# Patient Record
Sex: Male | Born: 1950
Health system: Southern US, Community
[De-identification: ages and names within clinical notes are randomized; demographics above are authoritative.]

## PROBLEM LIST (undated history)

## (undated) DIAGNOSIS — I10 Essential (primary) hypertension: Secondary | ICD-10-CM

## (undated) DIAGNOSIS — E785 Hyperlipidemia, unspecified: Secondary | ICD-10-CM

## (undated) DIAGNOSIS — K8020 Calculus of gallbladder without cholecystitis without obstruction: Secondary | ICD-10-CM

## (undated) DIAGNOSIS — E119 Type 2 diabetes mellitus without complications: Secondary | ICD-10-CM

## (undated) HISTORY — PX: CIRCUMCISION: SUR203

## (undated) HISTORY — PX: TONSILLECTOMY: SUR1361

## (undated) HISTORY — PX: OTHER SURGICAL HISTORY: SHX169

---

## 2016-11-08 DIAGNOSIS — M545 Low back pain: Secondary | ICD-10-CM | POA: Diagnosis not present

## 2016-11-08 DIAGNOSIS — I10 Essential (primary) hypertension: Secondary | ICD-10-CM | POA: Diagnosis not present

## 2016-11-08 DIAGNOSIS — Z23 Encounter for immunization: Secondary | ICD-10-CM | POA: Diagnosis not present

## 2016-11-08 DIAGNOSIS — R972 Elevated prostate specific antigen [PSA]: Secondary | ICD-10-CM | POA: Diagnosis not present

## 2016-11-08 DIAGNOSIS — M159 Polyosteoarthritis, unspecified: Secondary | ICD-10-CM | POA: Diagnosis not present

## 2016-11-08 DIAGNOSIS — E7800 Pure hypercholesterolemia, unspecified: Secondary | ICD-10-CM | POA: Diagnosis not present

## 2016-11-08 DIAGNOSIS — Z6825 Body mass index (BMI) 25.0-25.9, adult: Secondary | ICD-10-CM | POA: Diagnosis not present

## 2016-11-08 DIAGNOSIS — E119 Type 2 diabetes mellitus without complications: Secondary | ICD-10-CM | POA: Diagnosis not present

## 2016-11-08 DIAGNOSIS — E663 Overweight: Secondary | ICD-10-CM | POA: Diagnosis not present

## 2017-01-15 DIAGNOSIS — N401 Enlarged prostate with lower urinary tract symptoms: Secondary | ICD-10-CM | POA: Diagnosis not present

## 2017-01-15 DIAGNOSIS — R972 Elevated prostate specific antigen [PSA]: Secondary | ICD-10-CM | POA: Diagnosis not present

## 2017-01-17 DIAGNOSIS — L821 Other seborrheic keratosis: Secondary | ICD-10-CM | POA: Diagnosis not present

## 2017-01-17 DIAGNOSIS — L578 Other skin changes due to chronic exposure to nonionizing radiation: Secondary | ICD-10-CM | POA: Diagnosis not present

## 2017-01-17 DIAGNOSIS — L853 Xerosis cutis: Secondary | ICD-10-CM | POA: Diagnosis not present

## 2017-01-17 DIAGNOSIS — D485 Neoplasm of uncertain behavior of skin: Secondary | ICD-10-CM | POA: Diagnosis not present

## 2017-02-06 DIAGNOSIS — E119 Type 2 diabetes mellitus without complications: Secondary | ICD-10-CM | POA: Diagnosis not present

## 2017-02-06 DIAGNOSIS — Z6824 Body mass index (BMI) 24.0-24.9, adult: Secondary | ICD-10-CM | POA: Diagnosis not present

## 2017-02-06 DIAGNOSIS — E7800 Pure hypercholesterolemia, unspecified: Secondary | ICD-10-CM | POA: Diagnosis not present

## 2017-02-06 DIAGNOSIS — I10 Essential (primary) hypertension: Secondary | ICD-10-CM | POA: Diagnosis not present

## 2017-02-06 DIAGNOSIS — M545 Low back pain: Secondary | ICD-10-CM | POA: Diagnosis not present

## 2017-02-06 DIAGNOSIS — R972 Elevated prostate specific antigen [PSA]: Secondary | ICD-10-CM | POA: Diagnosis not present

## 2017-02-06 DIAGNOSIS — M159 Polyosteoarthritis, unspecified: Secondary | ICD-10-CM | POA: Diagnosis not present

## 2017-02-06 DIAGNOSIS — E669 Obesity, unspecified: Secondary | ICD-10-CM | POA: Diagnosis not present

## 2017-04-22 DIAGNOSIS — H2513 Age-related nuclear cataract, bilateral: Secondary | ICD-10-CM | POA: Diagnosis not present

## 2017-04-22 DIAGNOSIS — E119 Type 2 diabetes mellitus without complications: Secondary | ICD-10-CM | POA: Diagnosis not present

## 2017-05-13 DIAGNOSIS — M159 Polyosteoarthritis, unspecified: Secondary | ICD-10-CM | POA: Diagnosis not present

## 2017-05-13 DIAGNOSIS — R972 Elevated prostate specific antigen [PSA]: Secondary | ICD-10-CM | POA: Diagnosis not present

## 2017-05-13 DIAGNOSIS — I10 Essential (primary) hypertension: Secondary | ICD-10-CM | POA: Diagnosis not present

## 2017-05-13 DIAGNOSIS — M545 Low back pain: Secondary | ICD-10-CM | POA: Diagnosis not present

## 2017-05-13 DIAGNOSIS — E119 Type 2 diabetes mellitus without complications: Secondary | ICD-10-CM | POA: Diagnosis not present

## 2017-05-13 DIAGNOSIS — Z6824 Body mass index (BMI) 24.0-24.9, adult: Secondary | ICD-10-CM | POA: Diagnosis not present

## 2017-05-13 DIAGNOSIS — E669 Obesity, unspecified: Secondary | ICD-10-CM | POA: Diagnosis not present

## 2017-05-13 DIAGNOSIS — E7800 Pure hypercholesterolemia, unspecified: Secondary | ICD-10-CM | POA: Diagnosis not present

## 2017-07-16 DIAGNOSIS — N401 Enlarged prostate with lower urinary tract symptoms: Secondary | ICD-10-CM | POA: Diagnosis not present

## 2017-07-16 DIAGNOSIS — R972 Elevated prostate specific antigen [PSA]: Secondary | ICD-10-CM | POA: Diagnosis not present

## 2017-07-16 DIAGNOSIS — K5900 Constipation, unspecified: Secondary | ICD-10-CM | POA: Diagnosis not present

## 2017-07-18 DIAGNOSIS — D485 Neoplasm of uncertain behavior of skin: Secondary | ICD-10-CM | POA: Diagnosis not present

## 2017-07-18 DIAGNOSIS — L578 Other skin changes due to chronic exposure to nonionizing radiation: Secondary | ICD-10-CM | POA: Diagnosis not present

## 2017-07-18 DIAGNOSIS — C44319 Basal cell carcinoma of skin of other parts of face: Secondary | ICD-10-CM | POA: Diagnosis not present

## 2017-07-18 DIAGNOSIS — Z85820 Personal history of malignant melanoma of skin: Secondary | ICD-10-CM | POA: Diagnosis not present

## 2017-07-18 DIAGNOSIS — L821 Other seborrheic keratosis: Secondary | ICD-10-CM | POA: Diagnosis not present

## 2017-07-27 DIAGNOSIS — R0602 Shortness of breath: Secondary | ICD-10-CM | POA: Diagnosis not present

## 2017-07-27 DIAGNOSIS — M791 Myalgia: Secondary | ICD-10-CM | POA: Diagnosis not present

## 2017-07-27 DIAGNOSIS — R51 Headache: Secondary | ICD-10-CM | POA: Diagnosis not present

## 2017-07-27 DIAGNOSIS — J111 Influenza due to unidentified influenza virus with other respiratory manifestations: Secondary | ICD-10-CM | POA: Diagnosis not present

## 2017-07-30 DIAGNOSIS — J208 Acute bronchitis due to other specified organisms: Secondary | ICD-10-CM | POA: Diagnosis not present

## 2017-07-30 DIAGNOSIS — E119 Type 2 diabetes mellitus without complications: Secondary | ICD-10-CM | POA: Diagnosis not present

## 2017-07-30 DIAGNOSIS — E669 Obesity, unspecified: Secondary | ICD-10-CM | POA: Diagnosis not present

## 2017-07-30 DIAGNOSIS — I10 Essential (primary) hypertension: Secondary | ICD-10-CM | POA: Diagnosis not present

## 2017-07-30 DIAGNOSIS — R972 Elevated prostate specific antigen [PSA]: Secondary | ICD-10-CM | POA: Diagnosis not present

## 2017-07-30 DIAGNOSIS — E7800 Pure hypercholesterolemia, unspecified: Secondary | ICD-10-CM | POA: Diagnosis not present

## 2017-07-30 DIAGNOSIS — M159 Polyosteoarthritis, unspecified: Secondary | ICD-10-CM | POA: Diagnosis not present

## 2017-07-30 DIAGNOSIS — M545 Low back pain: Secondary | ICD-10-CM | POA: Diagnosis not present

## 2017-07-30 DIAGNOSIS — Z6824 Body mass index (BMI) 24.0-24.9, adult: Secondary | ICD-10-CM | POA: Diagnosis not present

## 2017-09-11 DIAGNOSIS — M159 Polyosteoarthritis, unspecified: Secondary | ICD-10-CM | POA: Diagnosis not present

## 2017-09-11 DIAGNOSIS — E7800 Pure hypercholesterolemia, unspecified: Secondary | ICD-10-CM | POA: Diagnosis not present

## 2017-09-11 DIAGNOSIS — E119 Type 2 diabetes mellitus without complications: Secondary | ICD-10-CM | POA: Diagnosis not present

## 2017-09-11 DIAGNOSIS — E669 Obesity, unspecified: Secondary | ICD-10-CM | POA: Diagnosis not present

## 2017-09-11 DIAGNOSIS — M545 Low back pain: Secondary | ICD-10-CM | POA: Diagnosis not present

## 2017-09-11 DIAGNOSIS — Z23 Encounter for immunization: Secondary | ICD-10-CM | POA: Diagnosis not present

## 2017-09-11 DIAGNOSIS — R972 Elevated prostate specific antigen [PSA]: Secondary | ICD-10-CM | POA: Diagnosis not present

## 2017-09-11 DIAGNOSIS — I10 Essential (primary) hypertension: Secondary | ICD-10-CM | POA: Diagnosis not present

## 2017-09-16 DIAGNOSIS — C44319 Basal cell carcinoma of skin of other parts of face: Secondary | ICD-10-CM | POA: Diagnosis not present

## 2017-12-03 DIAGNOSIS — Z139 Encounter for screening, unspecified: Secondary | ICD-10-CM | POA: Diagnosis not present

## 2017-12-03 DIAGNOSIS — E785 Hyperlipidemia, unspecified: Secondary | ICD-10-CM | POA: Diagnosis not present

## 2017-12-03 DIAGNOSIS — Z9181 History of falling: Secondary | ICD-10-CM | POA: Diagnosis not present

## 2017-12-03 DIAGNOSIS — Z1331 Encounter for screening for depression: Secondary | ICD-10-CM | POA: Diagnosis not present

## 2017-12-03 DIAGNOSIS — Z125 Encounter for screening for malignant neoplasm of prostate: Secondary | ICD-10-CM | POA: Diagnosis not present

## 2017-12-03 DIAGNOSIS — Z0000 Encounter for general adult medical examination without abnormal findings: Secondary | ICD-10-CM | POA: Diagnosis not present

## 2018-01-13 DIAGNOSIS — E7800 Pure hypercholesterolemia, unspecified: Secondary | ICD-10-CM | POA: Diagnosis not present

## 2018-01-13 DIAGNOSIS — M545 Low back pain: Secondary | ICD-10-CM | POA: Diagnosis not present

## 2018-01-13 DIAGNOSIS — E119 Type 2 diabetes mellitus without complications: Secondary | ICD-10-CM | POA: Diagnosis not present

## 2018-01-13 DIAGNOSIS — R972 Elevated prostate specific antigen [PSA]: Secondary | ICD-10-CM | POA: Diagnosis not present

## 2018-01-13 DIAGNOSIS — I10 Essential (primary) hypertension: Secondary | ICD-10-CM | POA: Diagnosis not present

## 2018-01-13 DIAGNOSIS — E669 Obesity, unspecified: Secondary | ICD-10-CM | POA: Diagnosis not present

## 2018-01-13 DIAGNOSIS — Z6825 Body mass index (BMI) 25.0-25.9, adult: Secondary | ICD-10-CM | POA: Diagnosis not present

## 2018-01-13 DIAGNOSIS — M159 Polyosteoarthritis, unspecified: Secondary | ICD-10-CM | POA: Diagnosis not present

## 2018-01-16 DIAGNOSIS — Z85820 Personal history of malignant melanoma of skin: Secondary | ICD-10-CM | POA: Diagnosis not present

## 2018-01-16 DIAGNOSIS — D1801 Hemangioma of skin and subcutaneous tissue: Secondary | ICD-10-CM | POA: Diagnosis not present

## 2018-01-16 DIAGNOSIS — L821 Other seborrheic keratosis: Secondary | ICD-10-CM | POA: Diagnosis not present

## 2018-01-16 DIAGNOSIS — C44319 Basal cell carcinoma of skin of other parts of face: Secondary | ICD-10-CM | POA: Diagnosis not present

## 2018-02-02 DIAGNOSIS — N401 Enlarged prostate with lower urinary tract symptoms: Secondary | ICD-10-CM | POA: Diagnosis not present

## 2018-02-02 DIAGNOSIS — R972 Elevated prostate specific antigen [PSA]: Secondary | ICD-10-CM | POA: Diagnosis not present

## 2018-04-27 DIAGNOSIS — H2513 Age-related nuclear cataract, bilateral: Secondary | ICD-10-CM | POA: Diagnosis not present

## 2018-04-27 DIAGNOSIS — E119 Type 2 diabetes mellitus without complications: Secondary | ICD-10-CM | POA: Diagnosis not present

## 2018-04-27 DIAGNOSIS — H40051 Ocular hypertension, right eye: Secondary | ICD-10-CM | POA: Diagnosis not present

## 2018-04-27 DIAGNOSIS — H524 Presbyopia: Secondary | ICD-10-CM | POA: Diagnosis not present

## 2018-05-13 DIAGNOSIS — M159 Polyosteoarthritis, unspecified: Secondary | ICD-10-CM | POA: Diagnosis not present

## 2018-05-13 DIAGNOSIS — R972 Elevated prostate specific antigen [PSA]: Secondary | ICD-10-CM | POA: Diagnosis not present

## 2018-05-13 DIAGNOSIS — E119 Type 2 diabetes mellitus without complications: Secondary | ICD-10-CM | POA: Diagnosis not present

## 2018-05-13 DIAGNOSIS — E7800 Pure hypercholesterolemia, unspecified: Secondary | ICD-10-CM | POA: Diagnosis not present

## 2018-05-13 DIAGNOSIS — M545 Low back pain: Secondary | ICD-10-CM | POA: Diagnosis not present

## 2018-05-13 DIAGNOSIS — I10 Essential (primary) hypertension: Secondary | ICD-10-CM | POA: Diagnosis not present

## 2018-07-17 DIAGNOSIS — D485 Neoplasm of uncertain behavior of skin: Secondary | ICD-10-CM | POA: Diagnosis not present

## 2018-07-17 DIAGNOSIS — L578 Other skin changes due to chronic exposure to nonionizing radiation: Secondary | ICD-10-CM | POA: Diagnosis not present

## 2018-07-17 DIAGNOSIS — C44319 Basal cell carcinoma of skin of other parts of face: Secondary | ICD-10-CM | POA: Diagnosis not present

## 2018-07-17 DIAGNOSIS — Z85820 Personal history of malignant melanoma of skin: Secondary | ICD-10-CM | POA: Diagnosis not present

## 2018-08-03 DIAGNOSIS — R972 Elevated prostate specific antigen [PSA]: Secondary | ICD-10-CM | POA: Diagnosis not present

## 2018-08-03 DIAGNOSIS — N401 Enlarged prostate with lower urinary tract symptoms: Secondary | ICD-10-CM | POA: Diagnosis not present

## 2018-09-15 DIAGNOSIS — R972 Elevated prostate specific antigen [PSA]: Secondary | ICD-10-CM | POA: Diagnosis not present

## 2018-09-15 DIAGNOSIS — Z1339 Encounter for screening examination for other mental health and behavioral disorders: Secondary | ICD-10-CM | POA: Diagnosis not present

## 2018-09-15 DIAGNOSIS — M159 Polyosteoarthritis, unspecified: Secondary | ICD-10-CM | POA: Diagnosis not present

## 2018-09-15 DIAGNOSIS — M545 Low back pain: Secondary | ICD-10-CM | POA: Diagnosis not present

## 2018-09-15 DIAGNOSIS — Z23 Encounter for immunization: Secondary | ICD-10-CM | POA: Diagnosis not present

## 2018-09-15 DIAGNOSIS — E7800 Pure hypercholesterolemia, unspecified: Secondary | ICD-10-CM | POA: Diagnosis not present

## 2018-09-15 DIAGNOSIS — I10 Essential (primary) hypertension: Secondary | ICD-10-CM | POA: Diagnosis not present

## 2018-09-15 DIAGNOSIS — E118 Type 2 diabetes mellitus with unspecified complications: Secondary | ICD-10-CM | POA: Diagnosis not present

## 2018-10-09 DIAGNOSIS — K8020 Calculus of gallbladder without cholecystitis without obstruction: Secondary | ICD-10-CM

## 2018-10-09 HISTORY — DX: Calculus of gallbladder without cholecystitis without obstruction: K80.20

## 2018-10-15 DIAGNOSIS — E118 Type 2 diabetes mellitus with unspecified complications: Secondary | ICD-10-CM | POA: Diagnosis not present

## 2018-10-15 DIAGNOSIS — M159 Polyosteoarthritis, unspecified: Secondary | ICD-10-CM | POA: Diagnosis not present

## 2018-10-15 DIAGNOSIS — R945 Abnormal results of liver function studies: Secondary | ICD-10-CM | POA: Diagnosis not present

## 2018-10-15 DIAGNOSIS — R972 Elevated prostate specific antigen [PSA]: Secondary | ICD-10-CM | POA: Diagnosis not present

## 2018-10-27 ENCOUNTER — Other Ambulatory Visit: Payer: Self-pay

## 2018-10-27 DIAGNOSIS — K851 Biliary acute pancreatitis without necrosis or infection: Secondary | ICD-10-CM | POA: Diagnosis not present

## 2018-10-27 DIAGNOSIS — M159 Polyosteoarthritis, unspecified: Secondary | ICD-10-CM | POA: Diagnosis not present

## 2018-10-27 DIAGNOSIS — K8020 Calculus of gallbladder without cholecystitis without obstruction: Secondary | ICD-10-CM | POA: Diagnosis not present

## 2018-10-27 DIAGNOSIS — R1084 Generalized abdominal pain: Secondary | ICD-10-CM | POA: Diagnosis not present

## 2018-10-27 DIAGNOSIS — R972 Elevated prostate specific antigen [PSA]: Secondary | ICD-10-CM | POA: Diagnosis not present

## 2018-10-27 DIAGNOSIS — Z79891 Long term (current) use of opiate analgesic: Secondary | ICD-10-CM | POA: Diagnosis not present

## 2018-10-27 DIAGNOSIS — Z7982 Long term (current) use of aspirin: Secondary | ICD-10-CM | POA: Diagnosis not present

## 2018-10-27 DIAGNOSIS — R112 Nausea with vomiting, unspecified: Secondary | ICD-10-CM | POA: Diagnosis not present

## 2018-10-27 DIAGNOSIS — R945 Abnormal results of liver function studies: Secondary | ICD-10-CM | POA: Diagnosis not present

## 2018-10-27 DIAGNOSIS — E119 Type 2 diabetes mellitus without complications: Secondary | ICD-10-CM | POA: Diagnosis not present

## 2018-10-27 DIAGNOSIS — R1013 Epigastric pain: Secondary | ICD-10-CM | POA: Diagnosis not present

## 2018-10-27 DIAGNOSIS — R52 Pain, unspecified: Secondary | ICD-10-CM | POA: Diagnosis not present

## 2018-10-27 DIAGNOSIS — I10 Essential (primary) hypertension: Secondary | ICD-10-CM | POA: Diagnosis not present

## 2018-10-27 DIAGNOSIS — Z7984 Long term (current) use of oral hypoglycemic drugs: Secondary | ICD-10-CM | POA: Diagnosis not present

## 2018-10-28 ENCOUNTER — Inpatient Hospital Stay (HOSPITAL_COMMUNITY): Payer: Medicare Other

## 2018-10-28 ENCOUNTER — Inpatient Hospital Stay (HOSPITAL_COMMUNITY)
Admission: EM | Admit: 2018-10-28 | Discharge: 2018-12-09 | DRG: 004 | Disposition: E | Payer: Medicare Other | Source: Other Acute Inpatient Hospital | Attending: Pulmonary Disease | Admitting: Pulmonary Disease

## 2018-10-28 ENCOUNTER — Encounter (HOSPITAL_COMMUNITY): Payer: Self-pay | Admitting: General Practice

## 2018-10-28 ENCOUNTER — Other Ambulatory Visit: Payer: Self-pay

## 2018-10-28 DIAGNOSIS — J9601 Acute respiratory failure with hypoxia: Secondary | ICD-10-CM

## 2018-10-28 DIAGNOSIS — Z7901 Long term (current) use of anticoagulants: Secondary | ICD-10-CM

## 2018-10-28 DIAGNOSIS — Z7189 Other specified counseling: Secondary | ICD-10-CM | POA: Diagnosis not present

## 2018-10-28 DIAGNOSIS — Z4682 Encounter for fitting and adjustment of non-vascular catheter: Secondary | ICD-10-CM | POA: Diagnosis not present

## 2018-10-28 DIAGNOSIS — J90 Pleural effusion, not elsewhere classified: Secondary | ICD-10-CM | POA: Diagnosis not present

## 2018-10-28 DIAGNOSIS — K859 Acute pancreatitis without necrosis or infection, unspecified: Secondary | ICD-10-CM | POA: Diagnosis not present

## 2018-10-28 DIAGNOSIS — K219 Gastro-esophageal reflux disease without esophagitis: Secondary | ICD-10-CM | POA: Diagnosis present

## 2018-10-28 DIAGNOSIS — K8689 Other specified diseases of pancreas: Secondary | ICD-10-CM | POA: Diagnosis present

## 2018-10-28 DIAGNOSIS — A419 Sepsis, unspecified organism: Secondary | ICD-10-CM | POA: Diagnosis not present

## 2018-10-28 DIAGNOSIS — R918 Other nonspecific abnormal finding of lung field: Secondary | ICD-10-CM | POA: Diagnosis not present

## 2018-10-28 DIAGNOSIS — R748 Abnormal levels of other serum enzymes: Secondary | ICD-10-CM | POA: Diagnosis present

## 2018-10-28 DIAGNOSIS — J690 Pneumonitis due to inhalation of food and vomit: Secondary | ICD-10-CM | POA: Diagnosis not present

## 2018-10-28 DIAGNOSIS — K8070 Calculus of gallbladder and bile duct without cholecystitis without obstruction: Secondary | ICD-10-CM | POA: Diagnosis present

## 2018-10-28 DIAGNOSIS — J9690 Respiratory failure, unspecified, unspecified whether with hypoxia or hypercapnia: Secondary | ICD-10-CM

## 2018-10-28 DIAGNOSIS — R935 Abnormal findings on diagnostic imaging of other abdominal regions, including retroperitoneum: Secondary | ICD-10-CM | POA: Diagnosis not present

## 2018-10-28 DIAGNOSIS — Z8249 Family history of ischemic heart disease and other diseases of the circulatory system: Secondary | ICD-10-CM

## 2018-10-28 DIAGNOSIS — Z4659 Encounter for fitting and adjustment of other gastrointestinal appliance and device: Secondary | ICD-10-CM

## 2018-10-28 DIAGNOSIS — E876 Hypokalemia: Secondary | ICD-10-CM | POA: Diagnosis not present

## 2018-10-28 DIAGNOSIS — E872 Acidosis: Secondary | ICD-10-CM | POA: Diagnosis not present

## 2018-10-28 DIAGNOSIS — Z0189 Encounter for other specified special examinations: Secondary | ICD-10-CM

## 2018-10-28 DIAGNOSIS — R109 Unspecified abdominal pain: Secondary | ICD-10-CM | POA: Diagnosis not present

## 2018-10-28 DIAGNOSIS — N170 Acute kidney failure with tubular necrosis: Secondary | ICD-10-CM | POA: Diagnosis present

## 2018-10-28 DIAGNOSIS — E119 Type 2 diabetes mellitus without complications: Secondary | ICD-10-CM | POA: Diagnosis not present

## 2018-10-28 DIAGNOSIS — R509 Fever, unspecified: Secondary | ICD-10-CM | POA: Diagnosis not present

## 2018-10-28 DIAGNOSIS — K8020 Calculus of gallbladder without cholecystitis without obstruction: Secondary | ICD-10-CM | POA: Diagnosis not present

## 2018-10-28 DIAGNOSIS — E874 Mixed disorder of acid-base balance: Secondary | ICD-10-CM | POA: Diagnosis not present

## 2018-10-28 DIAGNOSIS — L89819 Pressure ulcer of head, unspecified stage: Secondary | ICD-10-CM | POA: Diagnosis not present

## 2018-10-28 DIAGNOSIS — Z7984 Long term (current) use of oral hypoglycemic drugs: Secondary | ICD-10-CM

## 2018-10-28 DIAGNOSIS — R933 Abnormal findings on diagnostic imaging of other parts of digestive tract: Secondary | ICD-10-CM | POA: Diagnosis not present

## 2018-10-28 DIAGNOSIS — K567 Ileus, unspecified: Secondary | ICD-10-CM

## 2018-10-28 DIAGNOSIS — Z7982 Long term (current) use of aspirin: Secondary | ICD-10-CM

## 2018-10-28 DIAGNOSIS — N179 Acute kidney failure, unspecified: Secondary | ICD-10-CM | POA: Diagnosis present

## 2018-10-28 DIAGNOSIS — D72823 Leukemoid reaction: Secondary | ICD-10-CM | POA: Diagnosis present

## 2018-10-28 DIAGNOSIS — Z992 Dependence on renal dialysis: Secondary | ICD-10-CM

## 2018-10-28 DIAGNOSIS — J9811 Atelectasis: Secondary | ICD-10-CM | POA: Diagnosis present

## 2018-10-28 DIAGNOSIS — N400 Enlarged prostate without lower urinary tract symptoms: Secondary | ICD-10-CM | POA: Diagnosis present

## 2018-10-28 DIAGNOSIS — R945 Abnormal results of liver function studies: Secondary | ICD-10-CM | POA: Diagnosis not present

## 2018-10-28 DIAGNOSIS — K659 Peritonitis, unspecified: Secondary | ICD-10-CM | POA: Diagnosis not present

## 2018-10-28 DIAGNOSIS — E46 Unspecified protein-calorie malnutrition: Secondary | ICD-10-CM | POA: Diagnosis not present

## 2018-10-28 DIAGNOSIS — Z79899 Other long term (current) drug therapy: Secondary | ICD-10-CM

## 2018-10-28 DIAGNOSIS — Z978 Presence of other specified devices: Secondary | ICD-10-CM | POA: Diagnosis not present

## 2018-10-28 DIAGNOSIS — R001 Bradycardia, unspecified: Secondary | ICD-10-CM

## 2018-10-28 DIAGNOSIS — R188 Other ascites: Secondary | ICD-10-CM | POA: Diagnosis present

## 2018-10-28 DIAGNOSIS — Z789 Other specified health status: Secondary | ICD-10-CM

## 2018-10-28 DIAGNOSIS — E11649 Type 2 diabetes mellitus with hypoglycemia without coma: Secondary | ICD-10-CM | POA: Diagnosis not present

## 2018-10-28 DIAGNOSIS — R195 Other fecal abnormalities: Secondary | ICD-10-CM | POA: Diagnosis not present

## 2018-10-28 DIAGNOSIS — Z8349 Family history of other endocrine, nutritional and metabolic diseases: Secondary | ICD-10-CM

## 2018-10-28 DIAGNOSIS — I10 Essential (primary) hypertension: Secondary | ICD-10-CM | POA: Diagnosis present

## 2018-10-28 DIAGNOSIS — K8500 Idiopathic acute pancreatitis without necrosis or infection: Secondary | ICD-10-CM | POA: Diagnosis not present

## 2018-10-28 DIAGNOSIS — Z515 Encounter for palliative care: Secondary | ICD-10-CM | POA: Diagnosis not present

## 2018-10-28 DIAGNOSIS — L8990 Pressure ulcer of unspecified site, unspecified stage: Secondary | ICD-10-CM

## 2018-10-28 DIAGNOSIS — T508X5A Adverse effect of diagnostic agents, initial encounter: Secondary | ICD-10-CM | POA: Diagnosis not present

## 2018-10-28 DIAGNOSIS — E875 Hyperkalemia: Secondary | ICD-10-CM | POA: Diagnosis not present

## 2018-10-28 DIAGNOSIS — R6521 Severe sepsis with septic shock: Secondary | ICD-10-CM | POA: Diagnosis not present

## 2018-10-28 DIAGNOSIS — E1165 Type 2 diabetes mellitus with hyperglycemia: Secondary | ICD-10-CM | POA: Diagnosis present

## 2018-10-28 DIAGNOSIS — E1129 Type 2 diabetes mellitus with other diabetic kidney complication: Secondary | ICD-10-CM | POA: Diagnosis not present

## 2018-10-28 DIAGNOSIS — K760 Fatty (change of) liver, not elsewhere classified: Secondary | ICD-10-CM | POA: Diagnosis not present

## 2018-10-28 DIAGNOSIS — D72829 Elevated white blood cell count, unspecified: Secondary | ICD-10-CM | POA: Diagnosis not present

## 2018-10-28 DIAGNOSIS — R0989 Other specified symptoms and signs involving the circulatory and respiratory systems: Secondary | ICD-10-CM

## 2018-10-28 DIAGNOSIS — J181 Lobar pneumonia, unspecified organism: Secondary | ICD-10-CM | POA: Diagnosis not present

## 2018-10-28 DIAGNOSIS — Z451 Encounter for adjustment and management of infusion pump: Secondary | ICD-10-CM | POA: Diagnosis not present

## 2018-10-28 DIAGNOSIS — Z66 Do not resuscitate: Secondary | ICD-10-CM | POA: Diagnosis not present

## 2018-10-28 DIAGNOSIS — R7309 Other abnormal glucose: Secondary | ICD-10-CM | POA: Diagnosis not present

## 2018-10-28 DIAGNOSIS — Z79891 Long term (current) use of opiate analgesic: Secondary | ICD-10-CM | POA: Diagnosis not present

## 2018-10-28 DIAGNOSIS — K851 Biliary acute pancreatitis without necrosis or infection: Secondary | ICD-10-CM | POA: Diagnosis not present

## 2018-10-28 DIAGNOSIS — Z781 Physical restraint status: Secondary | ICD-10-CM

## 2018-10-28 DIAGNOSIS — E785 Hyperlipidemia, unspecified: Secondary | ICD-10-CM | POA: Diagnosis present

## 2018-10-28 DIAGNOSIS — R0689 Other abnormalities of breathing: Secondary | ICD-10-CM

## 2018-10-28 DIAGNOSIS — E860 Dehydration: Secondary | ICD-10-CM | POA: Diagnosis present

## 2018-10-28 DIAGNOSIS — R000 Tachycardia, unspecified: Secondary | ICD-10-CM | POA: Diagnosis present

## 2018-10-28 DIAGNOSIS — I319 Disease of pericardium, unspecified: Secondary | ICD-10-CM

## 2018-10-28 DIAGNOSIS — Z930 Tracheostomy status: Secondary | ICD-10-CM | POA: Diagnosis not present

## 2018-10-28 DIAGNOSIS — R1909 Other intra-abdominal and pelvic swelling, mass and lump: Secondary | ICD-10-CM | POA: Diagnosis not present

## 2018-10-28 DIAGNOSIS — R740 Nonspecific elevation of levels of transaminase and lactic acid dehydrogenase [LDH]: Secondary | ICD-10-CM | POA: Diagnosis present

## 2018-10-28 DIAGNOSIS — F419 Anxiety disorder, unspecified: Secondary | ICD-10-CM | POA: Diagnosis present

## 2018-10-28 DIAGNOSIS — Z95828 Presence of other vascular implants and grafts: Secondary | ICD-10-CM | POA: Diagnosis not present

## 2018-10-28 DIAGNOSIS — Z833 Family history of diabetes mellitus: Secondary | ICD-10-CM

## 2018-10-28 DIAGNOSIS — T17900A Unspecified foreign body in respiratory tract, part unspecified causing asphyxiation, initial encounter: Secondary | ICD-10-CM

## 2018-10-28 DIAGNOSIS — D649 Anemia, unspecified: Secondary | ICD-10-CM | POA: Diagnosis present

## 2018-10-28 DIAGNOSIS — R0902 Hypoxemia: Secondary | ICD-10-CM | POA: Diagnosis not present

## 2018-10-28 DIAGNOSIS — R9431 Abnormal electrocardiogram [ECG] [EKG]: Secondary | ICD-10-CM | POA: Diagnosis not present

## 2018-10-28 DIAGNOSIS — R739 Hyperglycemia, unspecified: Secondary | ICD-10-CM | POA: Diagnosis not present

## 2018-10-28 DIAGNOSIS — K8511 Biliary acute pancreatitis with uninfected necrosis: Secondary | ICD-10-CM | POA: Diagnosis present

## 2018-10-28 DIAGNOSIS — J9600 Acute respiratory failure, unspecified whether with hypoxia or hypercapnia: Secondary | ICD-10-CM | POA: Diagnosis not present

## 2018-10-28 DIAGNOSIS — R578 Other shock: Secondary | ICD-10-CM | POA: Diagnosis not present

## 2018-10-28 DIAGNOSIS — K6389 Other specified diseases of intestine: Secondary | ICD-10-CM | POA: Diagnosis not present

## 2018-10-28 DIAGNOSIS — R1310 Dysphagia, unspecified: Secondary | ICD-10-CM

## 2018-10-28 DIAGNOSIS — I959 Hypotension, unspecified: Secondary | ICD-10-CM | POA: Diagnosis not present

## 2018-10-28 DIAGNOSIS — R579 Shock, unspecified: Secondary | ICD-10-CM | POA: Diagnosis not present

## 2018-10-28 DIAGNOSIS — T17908A Unspecified foreign body in respiratory tract, part unspecified causing other injury, initial encounter: Secondary | ICD-10-CM

## 2018-10-28 DIAGNOSIS — Z452 Encounter for adjustment and management of vascular access device: Secondary | ICD-10-CM | POA: Diagnosis not present

## 2018-10-28 DIAGNOSIS — N141 Nephropathy induced by other drugs, medicaments and biological substances: Secondary | ICD-10-CM | POA: Diagnosis not present

## 2018-10-28 DIAGNOSIS — Z9911 Dependence on respirator [ventilator] status: Secondary | ICD-10-CM | POA: Diagnosis not present

## 2018-10-28 HISTORY — DX: Type 2 diabetes mellitus without complications: E11.9

## 2018-10-28 HISTORY — DX: Essential (primary) hypertension: I10

## 2018-10-28 HISTORY — DX: Calculus of gallbladder without cholecystitis without obstruction: K80.20

## 2018-10-28 HISTORY — DX: Hyperlipidemia, unspecified: E78.5

## 2018-10-28 LAB — COMPREHENSIVE METABOLIC PANEL
ALT: 429 U/L — ABNORMAL HIGH (ref 0–44)
AST: 381 U/L — ABNORMAL HIGH (ref 15–41)
Albumin: 3.8 g/dL (ref 3.5–5.0)
Alkaline Phosphatase: 67 U/L (ref 38–126)
Anion gap: 16 — ABNORMAL HIGH (ref 5–15)
BUN: 15 mg/dL (ref 8–23)
CALCIUM: 8.6 mg/dL — AB (ref 8.9–10.3)
CHLORIDE: 102 mmol/L (ref 98–111)
CO2: 19 mmol/L — ABNORMAL LOW (ref 22–32)
CREATININE: 1.72 mg/dL — AB (ref 0.61–1.24)
GFR, EST AFRICAN AMERICAN: 46 mL/min — AB (ref >60)
GFR, EST NON AFRICAN AMERICAN: 39 mL/min — AB (ref >60)
Glucose, Bld: 312 mg/dL — ABNORMAL HIGH (ref 70–99)
POTASSIUM: 4.2 mmol/L (ref 3.5–5.1)
Sodium: 137 mmol/L (ref 135–145)
Total Bilirubin: 3.1 mg/dL — ABNORMAL HIGH (ref 0.3–1.2)
Total Protein: 7.4 g/dL (ref 6.5–8.1)

## 2018-10-28 LAB — CBC
HCT: 55.6 % — ABNORMAL HIGH (ref 39.0–52.0)
HCT: 54.8 % — ABNORMAL HIGH (ref 39.0–52.0)
Hemoglobin: 17.8 g/dL — ABNORMAL HIGH (ref 13.0–17.0)
Hemoglobin: 17.4 g/dL — ABNORMAL HIGH (ref 13.0–17.0)
MCH: 28.4 pg (ref 26.0–34.0)
MCH: 28.4 pg (ref 26.0–34.0)
MCHC: 32 g/dL (ref 30.0–36.0)
MCHC: 31.8 g/dL (ref 30.0–36.0)
MCV: 88.8 fL (ref 80.0–100.0)
MCV: 89.5 fL (ref 80.0–100.0)
NRBC: 0 % (ref 0.0–0.2)
PLATELETS: 236 10*3/uL (ref 150–400)
PLATELETS: 183 10*3/uL (ref 150–400)
RBC: 6.26 MIL/uL — ABNORMAL HIGH (ref 4.22–5.81)
RBC: 6.12 MIL/uL — AB (ref 4.22–5.81)
RDW: 12.5 % (ref 11.5–15.5)
RDW: 12.8 % (ref 11.5–15.5)
WBC: 20.9 10*3/uL — AB (ref 4.0–10.5)
WBC: 29.7 10*3/uL — ABNORMAL HIGH (ref 4.0–10.5)
nRBC: 0 % (ref 0.0–0.2)

## 2018-10-28 LAB — LIPASE, BLOOD: LIPASE: 3700 U/L — AB (ref 11–51)

## 2018-10-28 MED ORDER — SODIUM CHLORIDE 0.9 % IV SOLN
INTRAVENOUS | Status: DC
  Administered 2018-10-28: 15:00:00 via INTRAVENOUS

## 2018-10-28 MED ORDER — POLYETHYLENE GLYCOL 3350 17 G PO PACK
17.0000 g | PACK | Freq: Every day | ORAL | Status: DC | PRN
  Filled 2018-10-28: qty 1

## 2018-10-28 MED ORDER — ENOXAPARIN SODIUM 40 MG/0.4ML ~~LOC~~ SOLN
40.0000 mg | SUBCUTANEOUS | Status: DC
  Administered 2018-10-28: 40 mg via SUBCUTANEOUS
  Filled 2018-10-28: qty 0.4

## 2018-10-28 MED ORDER — HYDROCODONE-ACETAMINOPHEN 5-325 MG PO TABS
1.0000 | ORAL_TABLET | Freq: Four times a day (QID) | ORAL | Status: DC | PRN
  Administered 2018-10-28: 1 via ORAL
  Filled 2018-10-28 (×2): qty 1

## 2018-10-28 MED ORDER — MORPHINE SULFATE (PF) 2 MG/ML IV SOLN
1.0000 mg | INTRAVENOUS | Status: DC | PRN
  Administered 2018-10-28 (×2): 1 mg via INTRAVENOUS
  Filled 2018-10-28 (×2): qty 1

## 2018-10-28 MED ORDER — HYDROMORPHONE HCL 1 MG/ML IJ SOLN
0.5000 mg | INTRAMUSCULAR | Status: DC | PRN
  Administered 2018-10-28 – 2018-10-29 (×7): 1 mg via INTRAVENOUS
  Filled 2018-10-28 (×7): qty 1

## 2018-10-28 MED ORDER — ASPIRIN EC 81 MG PO TBEC
81.0000 mg | DELAYED_RELEASE_TABLET | Freq: Every day | ORAL | Status: DC
  Administered 2018-10-28 – 2018-10-29 (×2): 81 mg via ORAL
  Filled 2018-10-28 (×2): qty 1

## 2018-10-28 MED ORDER — PIPERACILLIN-TAZOBACTAM 3.375 G IVPB 30 MIN
3.3750 g | Freq: Once | INTRAVENOUS | Status: AC
  Administered 2018-10-28: 3.375 g via INTRAVENOUS
  Filled 2018-10-28: qty 50

## 2018-10-28 MED ORDER — ONDANSETRON HCL 4 MG PO TABS
4.0000 mg | ORAL_TABLET | Freq: Four times a day (QID) | ORAL | Status: DC | PRN
  Filled 2018-10-28: qty 1

## 2018-10-28 MED ORDER — PIPERACILLIN-TAZOBACTAM 3.375 G IVPB
3.3750 g | Freq: Three times a day (TID) | INTRAVENOUS | Status: DC
  Administered 2018-10-28 – 2018-10-29 (×3): 3.375 g via INTRAVENOUS
  Filled 2018-10-28 (×4): qty 50

## 2018-10-28 MED ORDER — LISINOPRIL 10 MG PO TABS: 10.0000 mg | ORAL_TABLET | Freq: Every day | ORAL | Status: DC

## 2018-10-28 MED ORDER — ONDANSETRON HCL 4 MG/2ML IJ SOLN
4.0000 mg | Freq: Four times a day (QID) | INTRAMUSCULAR | Status: DC | PRN
  Administered 2018-10-28 – 2018-11-02 (×8): 4 mg via INTRAVENOUS
  Filled 2018-10-28 (×9): qty 2

## 2018-10-28 MED ORDER — SODIUM CHLORIDE 0.9 % IV SOLN
INTRAVENOUS | Status: AC
  Administered 2018-10-28 – 2018-10-30 (×5): via INTRAVENOUS

## 2018-10-28 NOTE — Consult Note (Signed)
[facility] Surgery Consult Note  Derek Riley [facility] 1950/12/30  [id number].    Requesting MD: Arta Silence Chief Complaint/Reason for Consult: gallstone pancreatitis  HPI:  Derek Riley is a 67yo male PMH DM, HTN, HLD who was transferred from [place] earlier today with suspected gallstone pancreatitis. Patient states that yesterday morning around 1130 he started having diffuse, worsening abdominal pain. Pain radiated into his chest and was associated with nausea and vomiting. Denies fever, chills, diarrhea, or constipation. He had a normal BM yesterday. Never had pain like this before. I do not have any images to review, but CT scan abdomen/pelvis from [place] reports acute pancreatitis without necrosis or loculated fluid collections; cholelithiasis with pericholecystic edema and stones in the distal common bile duct. Initial lab work reports WBC 9.6, AST 593, ALT 305, alk phos 74, bilirubin 3.2, and lipase 50341. Troponins negative. Patient was transferred here for evaluation by GI. MRCP has been ordered but not performed yet. Labs repeated today and revealed WBC 20.9 and LFTs down trending AST 381, ALT 429, alk phos 67, bilirubin 3.1. General surgery asked to see for eventual laparoscopic cholecystectomy.  Abdominal surgical history: none Anticoagulants: none Nonsmoker Currently unemployed   ROS: Review of Systems  Constitutional: Negative.   HENT: Negative.   Eyes: Negative.   Respiratory: Negative.   Cardiovascular: Negative.   Gastrointestinal: Positive for abdominal pain, nausea and vomiting. Negative for constipation and diarrhea.  Genitourinary: Negative.   Musculoskeletal: Negative.   Skin: Negative.   Neurological: Negative.    All systems reviewed and otherwise negative except for as above  History reviewed. No pertinent family history.  Past Medical History:  Diagnosis Date  . Diabetes mellitus without complication ([place])   . Gallstone 10/2018  .  Hyperlipidemia   . Hypertension     Past Surgical History:  Procedure Laterality Date  . CIRCUMCISION    . excision of moles     face & leg  . TONSILLECTOMY      Social History:  reports that he has never smoked. He has never used smokeless tobacco. He reports that he does not drink alcohol or use drugs.  Allergies: No Known Allergies  Medications Prior to Admission  Medication Sig Dispense Refill  . aspirin EC 81 MG tablet Take 81 mg by mouth daily.    Marland Kitchen atorvastatin (LIPITOR) 80 MG tablet Take 80 mg by mouth every evening.  3  . dicyclomine (BENTYL) 10 MG capsule Take 10 mg by mouth 3 (three) times daily as needed. unk    . ezetimibe (ZETIA) 10 MG tablet Take 10 mg by mouth every evening.  12  . finasteride (PROSCAR) 5 MG tablet Take 5 mg by mouth daily.  3  . GLUCOSAMINE SULFATE PO Take 1 capsule by mouth 2 (two) times daily.    Marland Kitchen HYDROcodone-acetaminophen (NORCO/VICODIN) 5-325 MG tablet Take 1 tablet by mouth every 6 (six) hours as needed for pain.  0  . lisinopril (PRINIVIL,ZESTRIL) 10 MG tablet Take 10 mg by mouth daily.  12  . metFORMIN (GLUCOPHAGE) 1000 MG tablet Take 1,000 mg by mouth 2 (two) times daily.  3  . Omega-3 1000 MG CAPS Take 1,000 mg by mouth 2 (two) times daily.    . promethazine (PHENERGAN) 25 MG tablet Take 25 mg by mouth every 6 (six) hours as needed for nausea/vomiting.      Prior to Admission medications   Medication Sig Start Date End Date Taking? Authorizing Provider  aspirin EC 81 MG tablet  Take 81 mg by mouth daily.   Yes [provider]  atorvastatin (LIPITOR) 80 MG tablet Take 80 mg by mouth every evening. 10/19/18  Yes [provider]  dicyclomine (BENTYL) 10 MG capsule Take 10 mg by mouth 3 (three) times daily as needed. unk 10/27/18  Yes [provider]  ezetimibe (ZETIA) 10 MG tablet Take 10 mg by mouth every evening. 08/13/18  Yes [provider]  finasteride (PROSCAR) 5 MG tablet Take 5 mg by mouth daily.  10/23/18  Yes [provider]  GLUCOSAMINE SULFATE PO Take 1 capsule by mouth 2 (two) times daily.   Yes [provider]  HYDROcodone-acetaminophen (NORCO/VICODIN) 5-325 MG tablet Take 1 tablet by mouth every 6 (six) hours as needed for pain. 09/15/18  Yes [provider]  lisinopril (PRINIVIL,ZESTRIL) 10 MG tablet Take 10 mg by mouth daily. 10/12/18  Yes [provider]  metFORMIN (GLUCOPHAGE) 1000 MG tablet Take 1,000 mg by mouth 2 (two) times daily. 10/15/18  Yes [provider]  Omega-3 1000 MG CAPS Take 1,000 mg by mouth 2 (two) times daily.   Yes [provider]  promethazine (PHENERGAN) 25 MG tablet Take 25 mg by mouth every 6 (six) hours as needed for nausea/vomiting. 10/27/18  Yes [provider]    Blood pressure 139/84, pulse (!) 115, temperature (!) 97.5 F (36.4 C), temperature source Oral, resp. rate 18, height 6' (1.829 m), weight 87.4 kg, SpO2 99 %. Physical Exam: General: pleasant, WD/WN white male who is laying in bed in NAD but appears uncomfortable  HEENT: head is normocephalic, atraumatic.  Sclera are noninjected.  Pupils equal and round.  Ears and nose without any masses or lesions.  Mouth is pink and moist. Dentition fair Heart: regular, rate, and rhythm.  No obvious murmurs, gallops, or rubs noted.  Palpable pedal pulses bilaterally Lungs: CTAB, no wheezes, rhonchi, or rales noted.  Respiratory effort nonlabored Abd: distended but soft, few BS heard, no masses, hernias, or organomegaly. Diffuse tenderness, moderately TTP LUQ and RUQ with voluntary guarding, no peritonitis MS: all 4 extremities are symmetrical with no cyanosis, clubbing, or edema. Skin: warm and dry with no masses, lesions, or rashes Psych: A&Ox3 with an appropriate affect. Neuro: cranial nerves grossly intact, extremity CSM intact bilaterally, normal speech  Results for orders placed or performed during the hospital encounter of 10/21/2018 (from  the past 48 hour(s))  Comprehensive metabolic panel     Status: Abnormal   Collection Time: 10/15/2018 11:39 AM  Result Value Ref Range   Sodium 137 135 - 145 mmol/L   Potassium 4.2 3.5 - 5.1 mmol/L   Chloride 102 98 - 111 mmol/L   CO2 19 (L) 22 - 32 mmol/L   Glucose, Bld 312 (H) 70 - 99 mg/dL   BUN 15 8 - 23 mg/dL   Creatinine, Ser 1.72 (H) 0.61 - 1.24 mg/dL   Calcium 8.6 (L) 8.9 - 10.3 mg/dL   Total Protein 7.4 6.5 - 8.1 g/dL   Albumin 3.8 3.5 - 5.0 g/dL   AST 381 (H) 15 - 41 U/L   ALT 429 (H) 0 - 44 U/L   Alkaline Phosphatase 67 38 - 126 U/L   Total Bilirubin 3.1 (H) 0.3 - 1.2 mg/dL   GFR calc non Af Amer 39 (L) >60 mL/min   GFR calc Af Amer 46 (L) >60 mL/min    Comment: (NOTE) The eGFR has been calculated using the CKD EPI equation. This calculation has not been  validated in all clinical situations. eGFR's persistently <60 mL/min signify possible Chronic Kidney Disease.    Anion gap 16 (H) 5 - 15    Comment: Performed at [facility], [place] [address] [place], [place] [address]  CBC     Status: Abnormal   Collection Time: 10/17/2018 11:39 AM  Result Value Ref Range   WBC 20.9 (H) 4.0 - 10.5 K/uL   RBC 6.26 (H) 4.22 - 5.81 MIL/uL   Hemoglobin 17.8 (H) 13.0 - 17.0 g/dL   HCT 55.6 (H) 39.0 - 52.0 %   MCV 88.8 80.0 - 100.0 fL   MCH 28.4 26.0 - 34.0 pg   MCHC 32.0 30.0 - 36.0 g/dL   RDW 12.5 11.5 - 15.5 %   Platelets 236 150 - 400 K/uL   nRBC 0.0 0.0 - 0.2 %    Comment: Performed at [facility], [place] [address] [place], [place] [address]   No results found.  Anti-infectives (From admission, onward)   Start     Dose/Rate Route Frequency Ordered Stop   11/04/2018 2000  piperacillin-tazobactam (ZOSYN) IVPB 3.375 g     3.375 g 12.5 mL/hr over 240 Minutes Intravenous Every 8 hours 10/18/2018 1156     10/15/2018 1230  piperacillin-tazobactam (ZOSYN) IVPB 3.375 g     3.375 g 100 mL/hr over 30 Minutes Intravenous  Once 10/21/2018 1156 11/04/2018 1506        Assessment/Plan DM HTN HLD  Acute pancreatitis Cholelithiasis, possible choledocholithiasis - Patient with pancreatitis likely secondary to gallstones. He is very tender on exam but I do not think that he has peritonitis. Initial lipase was >50,000, therefore likely a fairly severe case of pancreatitis. Agree with MRCP to evaluate for choledocholithiasis. GI has also been consulted and is following. Recommend laparoscopic cholecystectomy this admission, but it may be days before his pancreatitis has resolved and he is ready for surgery. Trend labs. Will continue to follow.  ID - zosyn 11/20>> VTE - SCDs, lovenox FEN - IVF at 125cc/hr, NPO Foley - none Follow up - TBD  Wellington Hampshire, [facility] Surgery 10/29/2018, 3:38 PM Pager: [phone] Mon 7:00 am -11:30 AM Tues-Fri 7:00 am-4:30 pm Sat-Sun 7:00 am-11:30 am

## 2018-10-28 NOTE — Consult Note (Signed)
[facility] Consultation Note  Referring Provider: Oretha Milch, MD [facility]) Primary Care Physician:  Cher Nakai, MD  Reason for Consultation:  Abdominal pain, pancreatitis, bile duct stones  HPI: Derek Riley is a 67 y.o. male with one day history of epigastric pain, radiation to back, nausea, vomiting.  Seen [place], had studies apparently showing pancreatitis, gallstones, cbd stones (?); we don't have the imaging studies to review.  Patient reports his pain is progressively increasing, has not improved.  No fevers but WBC 21k.  LFTs here elevated in mixed pattern.     Past Medical History:  Diagnosis Date  . Diabetes mellitus without complication ([place])   . Gallstone 10/2018  . Hyperlipidemia   . Hypertension     Past Surgical History:  Procedure Laterality Date  . CIRCUMCISION    . excision of moles     face & leg  . TONSILLECTOMY      Prior to Admission medications   Medication Sig Start Date End Date Taking? Authorizing Provider  aspirin EC 81 MG tablet Take 81 mg by mouth daily.   Yes [provider]  atorvastatin (LIPITOR) 80 MG tablet Take 80 mg by mouth every evening. 10/19/18  Yes [provider]  dicyclomine (BENTYL) 10 MG capsule Take 10 mg by mouth 3 (three) times daily as needed. unk 10/27/18  Yes [provider]  ezetimibe (ZETIA) 10 MG tablet Take 10 mg by mouth every evening. 08/13/18  Yes [provider]  finasteride (PROSCAR) 5 MG tablet Take 5 mg by mouth daily. 10/23/18  Yes [provider]  GLUCOSAMINE SULFATE PO Take 1 capsule by mouth 2 (two) times daily.   Yes [provider]  HYDROcodone-acetaminophen (NORCO/VICODIN) 5-325 MG tablet Take 1 tablet by mouth every 6 (six) hours as needed for pain. 09/15/18  Yes [provider]  lisinopril (PRINIVIL,ZESTRIL) 10 MG tablet Take 10 mg by mouth daily. 10/12/18  Yes [provider]  metFORMIN (GLUCOPHAGE) 1000 MG tablet Take 1,000 mg  by mouth 2 (two) times daily. 10/15/18  Yes [provider]  Omega-3 1000 MG CAPS Take 1,000 mg by mouth 2 (two) times daily.   Yes [provider]  promethazine (PHENERGAN) 25 MG tablet Take 25 mg by mouth every 6 (six) hours as needed for nausea/vomiting. 10/27/18  Yes [provider]    Current Facility-Administered Medications  Medication Dose Route Frequency Provider Last Rate Last Dose  . 0.9 %  sodium chloride infusion   Intravenous Continuous Oretha Milch D, MD      . aspirin EC tablet 81 mg  81 mg Oral Daily Oretha Milch D, MD   81 mg at 11/07/2018 1158  . enoxaparin (LOVENOX) injection 40 mg  40 mg Subcutaneous Q24H Oretha Milch D, MD      . HYDROcodone-acetaminophen (NORCO/VICODIN) 5-325 MG per tablet 1 tablet  1 tablet Oral Q6H PRN Oretha Milch D, MD   1 tablet at 10/11/2018 1158  . [START ON 10/29/2018] lisinopril (PRINIVIL,ZESTRIL) tablet 10 mg  10 mg Oral Daily Oretha Milch D, MD      . morphine 2 MG/ML injection 1 mg  1 mg Intravenous Q2H PRN Oretha Milch D, MD      . ondansetron (ZOFRAN) tablet 4 mg  4 mg Oral Q6H PRN Oretha Milch D, MD       Or  . ondansetron (ZOFRAN) injection 4 mg  4 mg Intravenous Q6H PRN Desiree Hane, MD      .  piperacillin-tazobactam (ZOSYN) IVPB 3.375 g  3.375 g Intravenous Once Lavenia Atlas, RPH      . piperacillin-tazobactam (ZOSYN) IVPB 3.375 g  3.375 g Intravenous Q8H Mancheril, Darnell Level, RPH      . polyethylene glycol (MIRALAX / GLYCOLAX) packet 17 g  17 g Oral Daily PRN Desiree Hane, MD        Allergies as of 11/02/2018  . (No Known Allergies)    History reviewed. No pertinent family history.  Social History   Socioeconomic History  . Marital status: Single    Spouse name: Not on file  . Number of children: Not on file  . Years of education: Not on file  . Highest education level: Not on file  Occupational History  . Not on file  Social Needs  . Financial resource strain: Not on  file  . Food insecurity:    Worry: Not on file    Inability: Not on file  . Transportation needs:    Medical: Not on file    Non-medical: Not on file  Tobacco Use  . Smoking status: Never Smoker  . Smokeless tobacco: Never Used  Substance and Sexual Activity  . Alcohol use: Never    Frequency: Never  . Drug use: Never  . Sexual activity: Not on file  Lifestyle  . Physical activity:    Days per week: Not on file    Minutes per session: Not on file  . Stress: Not on file  Relationships  . Social connections:    Talks on phone: Not on file    Gets together: Not on file    Attends religious service: Not on file    Active member of club or organization: Not on file    Attends meetings of clubs or organizations: Not on file    Relationship status: Not on file  . Intimate partner violence:    Fear of current or ex partner: Not on file    Emotionally abused: Not on file    Physically abused: Not on file    Forced sexual activity: Not on file  Other Topics Concern  . Not on file  Social History Narrative  . Not on file    Review of Systems: As per HPI, all others negative  Physical Exam: Vital signs in last 24 hours: Temp:  [97.8 F (36.6 C)] 97.8 F (36.6 C) (11/20 0920) Pulse Rate:  [98] 98 (11/20 0920) Resp:  [22] 22 (11/20 0920) BP: (158)/(89) 158/89 (11/20 0920) SpO2:  [97 %] 97 % (11/20 0920) Weight:  [87.4 kg] 87.4 kg (11/20 0920)   General:   Alert, uncomfortable-appearing Head:  Normocephalic and atraumatic. Eyes:  Sclera clear, no icterus.   Conjunctiva pink. Ears:  Normal auditory acuity. Nose:  No deformity, discharge,  or lesions. Mouth:  No deformity or lesions.  Oropharynx pink & moist. Neck:  Supple; no masses or thyromegaly. Lungs:  Clear throughout to auscultation.   No wheezes, crackles, or rhonchi. No acute distress. Heart:  Regular rate and rhythm; no murmurs, clicks, rubs,  or gallops. Abdomen: Mild distention, epigastric and periumbilical  tenderness, no bowel sounds, voluntary guarding, rebound tenderness seen.. No masses, hepatosplenomegaly or hernias noted.Msk:  Symmetrical without gross deformities. Normal posture. Pulses:  Normal pulses noted. Extremities:  Without clubbing or edema. Neurologic:  Alert and  oriented x4;  grossly normal neurologically. Skin:  Intact without significant lesions or rashes. Psych:  Alert and cooperative. Normal mood and affect.   Lab Results:  Recent Labs    10/27/2018 1139  WBC 20.9*  HGB 17.8*  HCT 55.6*  PLT 236   BMET Recent Labs    10/19/2018 1139  NA 137  K 4.2  CL 102  CO2 19*  GLUCOSE 312*  BUN 15  CREATININE 1.72*  CALCIUM 8.6*   LFT Recent Labs    11/04/2018 1139  PROT 7.4  ALBUMIN 3.8  AST 381*  ALT 429*  ALKPHOS 67  BILITOT 3.1*   PT/INR No results for input(s): LABPROT, INR in the last 72 hours.  Studies/Results: No results found.  Impression:  1.  Abdominal pain, significant.  Patient has peritonitis on exam. 2.  Pancreatitis, gallstones, bile duct stones, by report (don't have any of these images to review); lipase reportedly 50,000 at outside hospital. 3.  Elevated liver enzymes.  Plan:  1.  NPO, antibiotics, IV fluids. 2.  MRI/MRCP ordered. 3.  I have discussed my findings with surgical team, who will be seeing patient soon. 4.  I do not suspect patient's current degree of abdominal pain can solely be explained by choledocholithiasis/cholangitis.  Would not pursue ERCP as of yet, given significant tenderness on exam and suspicion for significant pancreatitis. 5.  [facility] will follow.   LOS: 0 days   Madden Garron M  10/25/2018, 1:58 PM  Cell [phone] If no answer or after 5 PM call [phone]

## 2018-10-28 NOTE — Progress Notes (Signed)
Received patient as transfer from [facility]. Patient alert and oriented x4. Patient alert and oriented. Oriented to bed controls and call bell. Admissions paged to notify of patient's arrival and assign attending.   Will continue to monitor.

## 2018-10-28 NOTE — Progress Notes (Signed)
Pharmacy Antibiotic Note  Derek Riley is a 67 y.o. male admitted on 10/09/2018 with intra-abdominal infection.  Pharmacy has been consulted for Zosyn dosing. WBC elevated at 20.9. SCr 1.72.   Plan: -Start Zosyn 3.375 gm IV Q 8 hours  -Monitor cultures and clinical progress   Height: 6' (182.9 cm) Weight: 192 lb 10.9 oz (87.4 kg) IBW/kg (Calculated) : 77.6  Temp (24hrs), Avg:97.8 F (36.6 C), Min:97.8 F (36.6 C), Max:97.8 F (36.6 C)  No results for input(s): WBC, CREATININE, LATICACIDVEN, VANCOTROUGH, VANCOPEAK, VANCORANDOM, GENTTROUGH, GENTPEAK, GENTRANDOM, TOBRATROUGH, TOBRAPEAK, TOBRARND, AMIKACINPEAK, AMIKACINTROU, AMIKACIN in the last 168 hours.  CrCl cannot be calculated (No successful lab value found.).    No Known Allergies  Antimicrobials this admission: Zosyn 11/20 >>   Dose adjustments this admission: None   Thank you for allowing pharmacy to be a part of this patient's care.  Albertina Parr, PharmD., BCPS Clinical Pharmacist Clinical phone for 10/14/2018 until 3:30pm: [phone] If after 3:30pm, please refer to [facility] for unit-specific pharmacist

## 2018-10-28 NOTE — H&P (Addendum)
History and Physical  Derek Riley [facility] HGD:[id number] DOB: August 04, 1951 DOA: 10/29/2018  PCP: Cher Nakai, MD  Patient coming from:Home Chief Complaint: abdominal pain   HPI: Derek Riley is a 67 y.o. male with medical history significant for HTN, HLD, who presents on 10/09/2018 with abdominal pain in 1 day.  He states this started suddenly yesterday morning with belly pain all over and no particular radiation and did not improve with over-the-counter medications.  He was seen by his primary care doctor prescribed some additional supportive care with very little improvement.  Because of the worsening pain with multiple episodes of emesis he presented to [facility] ED.  He denied any fevers, chills, recent sick contacts.  Denies any history of pancreatitis or abdominal pain like this.  Does not currently drink alcohol.    ED Course: At [facility] ED he was afebrile with heart rate range 94-1 03, blood pressure 168/84-25/91.  Lab work-up significant for white count of 9.6, creatinine 1, ALT 305, AST 593, total bilirubin 3.2, lipase 50,341.  UA unremarkable CT abdomen there showed severe edema of the pancreas consistent with pancreatitis with no fluid collection, gallbladder wall thickening concerning for either reactive or cholecystitis, and unclear if choledocholithiasis was present, no biliary dilatation was noted.  Patient was given 4 mg IV morphine followed by 0.5 IV Dilaudid for pain control, treated with IV Zosyn.  1 L bolus normal saline, IV Phenergan and IV Zofran, IV Reglan, IV Tylenol before transfer to [facility].  Review of Systems:As mentioned in the history of present illness.Review of systems are otherwise negative    Past Medical History:  Diagnosis Date  . Diabetes mellitus without complication ([place])   . Gallstone 10/2018  . Hyperlipidemia   . Hypertension    Past Surgical History:  Procedure Laterality Date  . CIRCUMCISION    . excision of moles     face & leg  . TONSILLECTOMY      No Known Allergies Social History:  reports that he has never smoked. He has never used smokeless tobacco. He reports that he does not drink alcohol or use drugs. Family History  Problem Relation Age of Onset  . Pancreatitis Neg Hx       Prior to Admission medications   Medication Sig Start Date End Date Taking? Authorizing Provider  aspirin EC 81 MG tablet Take 81 mg by mouth daily.   Yes [provider]  atorvastatin (LIPITOR) 80 MG tablet Take 80 mg by mouth every evening. 10/19/18  Yes [provider]  dicyclomine (BENTYL) 10 MG capsule Take 10 mg by mouth 3 (three) times daily as needed. unk 10/27/18  Yes [provider]  ezetimibe (ZETIA) 10 MG tablet Take 10 mg by mouth every evening. 08/13/18  Yes [provider]  finasteride (PROSCAR) 5 MG tablet Take 5 mg by mouth daily. 10/23/18  Yes [provider]  GLUCOSAMINE SULFATE PO Take 1 capsule by mouth 2 (two) times daily.   Yes [provider]  HYDROcodone-acetaminophen (NORCO/VICODIN) 5-325 MG tablet Take 1 tablet by mouth every 6 (six) hours as needed for pain. 09/15/18  Yes [provider]  lisinopril (PRINIVIL,ZESTRIL) 10 MG tablet Take 10 mg by mouth daily. 10/12/18  Yes [provider]  metFORMIN (GLUCOPHAGE) 1000 MG tablet Take 1,000 mg by mouth 2 (two) times daily. 10/15/18  Yes [provider]  Omega-3 1000 MG CAPS Take 1,000 mg by mouth 2 (two) times daily.   Yes [provider]  promethazine (PHENERGAN) 25 MG tablet Take 25 mg by mouth every 6 (six) hours as needed for nausea/vomiting. 10/27/18  Yes [provider]    Physical Exam: BP 117/82 (BP Location: Right Arm)   Pulse (!) 111   Temp 98.4 F (36.9 C) (Oral)   Resp 18   Ht 6' (1.829 m)   Wt 87.4 kg   SpO2 99%   BMI 26.13 kg/m   Constitutional lying in bed, clearly uncomfortable  Eyes: EOMI, anicteric sclera ENMT: Dry oral mucosa Cardiovascular: Tachycardic,  no appreciable murmurs Respiratory: Normal respiratory effort on room air Abdomen: Soft, somewhat distended, tender to palpation in all quadrants without rebound tenderness or guarding, decreased bowel sounds Skin: No rash ulcers, or lesions. Without skin tenting  Neurologic: Grossly no focal neuro deficit. Psychiatric:Appropriate affect, and mood. Mental status AAOx3          Labs on Admission:  Basic Metabolic Panel: Recent Labs  Lab 10/26/2018 1139  NA 137  K 4.2  CL 102  CO2 19*  GLUCOSE 312*  BUN 15  CREATININE 1.72*  CALCIUM 8.6*   Liver Function Tests: Recent Labs  Lab 11/06/2018 1139  AST 381*  ALT 429*  ALKPHOS 67  BILITOT 3.1*  PROT 7.4  ALBUMIN 3.8   Recent Labs  Lab 10/21/2018 1524  LIPASE 3,700*   No results for input(s): AMMONIA in the last 168 hours. CBC: Recent Labs  Lab 10/25/2018 1139  WBC 20.9*  HGB 17.8*  HCT 55.6*  MCV 88.8  PLT 236   Cardiac Enzymes: No results for input(s): CKTOTAL, CKMB, CKMBINDEX, TROPONINI in the last 168 hours.  BNP (last 3 results) No results for input(s): BNP in the last 8760 hours.  ProBNP (last 3 results) No results for input(s): PROBNP in the last 8760 hours.  CBG: No results for input(s): GLUCAP in the last 168 hours.  Radiological Exams on Admission: No results found.    Assessment/Plan Present on Admission: . Acute gallstone pancreatitis . HTN (hypertension) . AKI (acute kidney injury) ([place])  Active Problems:   Acute gallstone pancreatitis   HTN (hypertension)   AKI (acute kidney injury) ([place])   Gallstone pancreatitis Seems quite severe given the amount of pain on exam (nontoxic abdominal exam), significantly elevated lipase (50,000 and outside hospital, 3700 here), and elevated LFTs/T bili CT abdominal imaging outside hospital shows no local complications from pancreatitis Unclear if choledocholithiasis Gallbladder wall thickening concerning for cholecystitis, appreciate surgery  recommendations, will eventually need laparoscopic cholecystectomy after resolution -Remains afebrile we will continue IV Zosyn -N.p.o., time normal saline, IV pain control with PRN 0.5-1 mg hydromorphone every 2 hours for severe pain, or opioid regimen -Awaiting MRCP to evaluate for choledocholithiasis, appreciate GI recommendations  AKI, likely prerenal etiology related to decreased p.o./emesis Creatinine 1 and outside hospital, elevated to 1.7 here Expect improvement with IV fluid resuscitation -Serial BMPs -Avoid nephrotoxins  Hypertension, at goal Home lisinopril 10 mg  Hyperlipidemia, stable Holding home statin and zeta in setting of transaminitis  BPH Holding finasteride since not taking oral currently  Hyperglycemia, quite elevated to 300s on BMP (200s at outside hospital) ? Hx of diabetes, on no home meds Check A1c CBG monitoring, hold off on sliding scale since likely insulin naive, if persistens can add sensitive scale since currently NPO   DVT prophylaxis: Lovenox  Code Status: Full code  Family Communication: No family at bedside family was present at bedside and updated appropriately at the time of interview.   Disposition Plan:  Admitted as inpatient suspect will stay for greater than 2 midnights for further management of severe gallstone pancreatitis  Consults called: Gastroenterology, surgery        Desiree Hane MD [facility]  Pager [phone]  If 7PM-7AM, please contact night-coverage [email or web address] Password [facility]  10/12/2018, 10:48 PM

## 2018-10-28 NOTE — Progress Notes (Signed)
Patient is nauseated and in alot of pain sending back upstairs, cannot tolerate exam, RN aware.

## 2018-10-28 NOTE — Progress Notes (Signed)
Inpatient Diabetes Program Recommendations  AACE/ADA: New Consensus Statement on Inpatient Glycemic Control (2015)  Target Ranges:  Prepandial:   less than 140 mg/dL      Peak postprandial:   less than 180 mg/dL (1-2 hours)      Critically ill patients:  140 - 180 mg/dL   Review of Glycemic Control Results for Derek Riley, Derek Riley (MRN [id number]) as of 10/27/2018 13:31  Ref. Range 10/29/2018 11:39  Glucose Latest Ref Range: 70 - 99 mg/dL 312 (H)   Diabetes history: Type 2 DM Outpatient Diabetes medications: Metformin 1000 mg BID Current orders for Inpatient glycemic control: none  Inpatient Diabetes Program Recommendations:    Consider:  Updating A1C?  Adding Novolog 0-9 units TID and Novolog 0-5 units QHS under glycemic control order set.   Thanks, Bronson Curb, MSN, RNC-OB Diabetes Coordinator [phone] (8a-5p)

## 2018-10-29 ENCOUNTER — Inpatient Hospital Stay (HOSPITAL_COMMUNITY): Payer: Medicare Other

## 2018-10-29 LAB — COMPREHENSIVE METABOLIC PANEL WITH GFR
ALT: 422 U/L — ABNORMAL HIGH (ref 0–44)
ALT: 374 U/L — ABNORMAL HIGH (ref 0–44)
AST: 358 U/L — ABNORMAL HIGH (ref 15–41)
AST: 399 U/L — ABNORMAL HIGH (ref 15–41)
Albumin: 3.4 g/dL — ABNORMAL LOW (ref 3.5–5.0)
Albumin: 2.9 g/dL — ABNORMAL LOW (ref 3.5–5.0)
Alkaline Phosphatase: 55 U/L (ref 38–126)
Alkaline Phosphatase: 50 U/L (ref 38–126)
Anion gap: 14 (ref 5–15)
Anion gap: 13 (ref 5–15)
BUN: 27 mg/dL — ABNORMAL HIGH (ref 8–23)
BUN: 44 mg/dL — ABNORMAL HIGH (ref 8–23)
CO2: 17 mmol/L — ABNORMAL LOW (ref 22–32)
CO2: 17 mmol/L — ABNORMAL LOW (ref 22–32)
Calcium: 8 mg/dL — ABNORMAL LOW (ref 8.9–10.3)
Calcium: 6.8 mg/dL — ABNORMAL LOW (ref 8.9–10.3)
Chloride: 109 mmol/L (ref 98–111)
Chloride: 109 mmol/L (ref 98–111)
Creatinine, Ser: 2.98 mg/dL — ABNORMAL HIGH (ref 0.61–1.24)
Creatinine, Ser: 4.91 mg/dL — ABNORMAL HIGH (ref 0.61–1.24)
GFR calc Af Amer: 23 mL/min — ABNORMAL LOW
GFR calc Af Amer: 13 mL/min — ABNORMAL LOW
GFR calc non Af Amer: 20 mL/min — ABNORMAL LOW
GFR calc non Af Amer: 11 mL/min — ABNORMAL LOW
Glucose, Bld: 215 mg/dL — ABNORMAL HIGH (ref 70–99)
Glucose, Bld: 211 mg/dL — ABNORMAL HIGH (ref 70–99)
Potassium: 4.7 mmol/L (ref 3.5–5.1)
Potassium: 5.2 mmol/L — ABNORMAL HIGH (ref 3.5–5.1)
Sodium: 140 mmol/L (ref 135–145)
Sodium: 139 mmol/L (ref 135–145)
Total Bilirubin: 6.3 mg/dL — ABNORMAL HIGH (ref 0.3–1.2)
Total Bilirubin: 6.3 mg/dL — ABNORMAL HIGH (ref 0.3–1.2)
Total Protein: 6.7 g/dL (ref 6.5–8.1)
Total Protein: 6.4 g/dL — ABNORMAL LOW (ref 6.5–8.1)

## 2018-10-29 LAB — CBC
HCT: 47.5 % (ref 39.0–52.0)
Hemoglobin: 14.8 g/dL (ref 13.0–17.0)
MCH: 28.3 pg (ref 26.0–34.0)
MCHC: 31.2 g/dL (ref 30.0–36.0)
MCV: 90.8 fL (ref 80.0–100.0)
Platelets: 158 10*3/uL (ref 150–400)
RBC: 5.23 MIL/uL (ref 4.22–5.81)
RDW: 13.2 % (ref 11.5–15.5)
WBC: 26.9 10*3/uL — ABNORMAL HIGH (ref 4.0–10.5)
nRBC: 0 % (ref 0.0–0.2)

## 2018-10-29 LAB — HEMOGLOBIN A1C
Hgb A1c MFr Bld: 8 % — ABNORMAL HIGH (ref 4.8–5.6)
Mean Plasma Glucose: 182.9 mg/dL

## 2018-10-29 LAB — GLUCOSE, CAPILLARY
Glucose-Capillary: 165 mg/dL — ABNORMAL HIGH (ref 70–99)
Glucose-Capillary: 178 mg/dL — ABNORMAL HIGH (ref 70–99)
Glucose-Capillary: 197 mg/dL — ABNORMAL HIGH (ref 70–99)
Glucose-Capillary: 207 mg/dL — ABNORMAL HIGH (ref 70–99)
Glucose-Capillary: 209 mg/dL — ABNORMAL HIGH (ref 70–99)

## 2018-10-29 LAB — LIPASE, BLOOD: Lipase: 3361 U/L — ABNORMAL HIGH (ref 11–51)

## 2018-10-29 LAB — HIV ANTIBODY (ROUTINE TESTING W REFLEX): HIV Screen 4th Generation wRfx: NONREACTIVE

## 2018-10-29 MED ORDER — METOCLOPRAMIDE HCL 5 MG/ML IJ SOLN
5.0000 mg | Freq: Four times a day (QID) | INTRAMUSCULAR | Status: DC
  Administered 2018-10-29 – 2018-10-31 (×8): 5 mg via INTRAVENOUS
  Filled 2018-10-29 (×3): qty 1
  Filled 2018-10-29 (×2): qty 2
  Filled 2018-10-29 (×2): qty 1
  Filled 2018-10-29 (×2): qty 2
  Filled 2018-10-29 (×3): qty 1

## 2018-10-29 MED ORDER — PIPERACILLIN-TAZOBACTAM IN DEX 2-0.25 GM/50ML IV SOLN
2.2500 g | Freq: Four times a day (QID) | INTRAVENOUS | Status: DC
  Administered 2018-10-29 – 2018-11-03 (×19): 2.25 g via INTRAVENOUS
  Filled 2018-10-29 (×23): qty 50

## 2018-10-29 MED ORDER — HYDROMORPHONE HCL 1 MG/ML IJ SOLN
2.0000 mg | INTRAMUSCULAR | Status: DC | PRN
  Administered 2018-10-29 – 2018-10-30 (×7): 2 mg via INTRAVENOUS
  Filled 2018-10-29 (×8): qty 2

## 2018-10-29 MED ORDER — PROMETHAZINE HCL 25 MG/ML IJ SOLN
12.5000 mg | Freq: Once | INTRAMUSCULAR | Status: AC
  Administered 2018-10-29: 12.5 mg via INTRAVENOUS
  Filled 2018-10-29: qty 1

## 2018-10-29 MED ORDER — PROMETHAZINE HCL 25 MG/ML IJ SOLN
12.5000 mg | Freq: Four times a day (QID) | INTRAMUSCULAR | Status: DC | PRN
  Administered 2018-10-29 – 2018-11-03 (×2): 12.5 mg via INTRAVENOUS
  Filled 2018-10-29 (×2): qty 1

## 2018-10-29 MED ORDER — ENOXAPARIN SODIUM 30 MG/0.3ML ~~LOC~~ SOLN
30.0000 mg | SUBCUTANEOUS | Status: DC
  Administered 2018-10-29: 30 mg via SUBCUTANEOUS
  Filled 2018-10-29: qty 0.3

## 2018-10-29 MED ORDER — HYDROMORPHONE HCL 1 MG/ML IJ SOLN
1.0000 mg | INTRAMUSCULAR | Status: DC | PRN
  Administered 2018-10-29: 1 mg via INTRAVENOUS
  Administered 2018-10-29: 2 mg via INTRAVENOUS
  Filled 2018-10-29: qty 2
  Filled 2018-10-29: qty 1

## 2018-10-29 NOTE — Progress Notes (Signed)
Called MD due to patient feeling miserable and complaining of pain and nausea despite PRN medications administered. MD stated patient needed to be transferred to step down due to concern of his renal function.   Report called to Seth Bake, RN on 4E. Patient transferred via SWOT.

## 2018-10-29 NOTE — Progress Notes (Signed)
Patient bladder scanned- 159ml total.

## 2018-10-29 NOTE — Consult Note (Signed)
[facility] Renal Consultation Note  Requesting MD:  Dr Karleen Hampshire Indication for Consultation:  AKI  HPI: Derek Riley is a 67 y.o. male with HTN, HL who is currently hospitalized with gallstone pancreatitis and is seen for AKI.   Pt in usual state of health until acute onset abdominal pain took him to PCP [facility].  Dx with pancreatitis on CT (?contrast) + lipase elevation.  Report of gallstone.  Transferred to [facility] for care.    Since admission GI, surgery have consulted.  Supportive care ongoing and lipase 5000 > 3700.  Still with significant pain, nausea, emesis currently.   Cr 1 on presentation and has trended up to 4.9 as of noon today.  UOP only 132m today with 1314mon bladder scan.    No h/o CKD.     Creatinine, Ser  Date/Time Value Ref Range Status  10/29/2018 11:25 AM 4.91 (H) 0.61 - 1.24 mg/dL Final  11/01/2018 11:23 PM 2.98 (H) 0.61 - 1.24 mg/dL Final    Comment:    DELTA CHECK NOTED  10/29/2018 11:39 AM 1.72 (H) 0.61 - 1.24 mg/dL Final     PMHx:   Past Medical History:  Diagnosis Date  . Diabetes mellitus without complication ([place]  . Gallstone 10/2018  . Hyperlipidemia   . Hypertension     Past Surgical History:  Procedure Laterality Date  . CIRCUMCISION    . excision of moles     face & leg  . TONSILLECTOMY      Family Hx:  Family History  Problem Relation Age of Onset  . Pancreatitis Neg Hx     Social History:  reports that he has never smoked. He has never used smokeless tobacco. He reports that he does not drink alcohol or use drugs.  Allergies: No Known Allergies  Medications: Prior to Admission medications   Medication Sig Start Date End Date Taking? Authorizing Provider  aspirin EC 81 MG tablet Take 81 mg by mouth daily.   Yes [provider]  atorvastatin (LIPITOR) 80 MG tablet Take 80 mg by mouth every evening. 10/19/18  Yes [provider]  dicyclomine (BENTYL) 10 MG capsule Take 10 mg by  mouth 3 (three) times daily as needed. unk 10/27/18  Yes [provider]  ezetimibe (ZETIA) 10 MG tablet Take 10 mg by mouth every evening. 08/13/18  Yes [provider]  finasteride (PROSCAR) 5 MG tablet Take 5 mg by mouth daily. 10/23/18  Yes [provider]  GLUCOSAMINE SULFATE PO Take 1 capsule by mouth 2 (two) times daily.   Yes [provider]  HYDROcodone-acetaminophen (NORCO/VICODIN) 5-325 MG tablet Take 1 tablet by mouth every 6 (six) hours as needed for pain. 09/15/18  Yes [provider]  lisinopril (PRINIVIL,ZESTRIL) 10 MG tablet Take 10 mg by mouth daily. 10/12/18  Yes [provider]  metFORMIN (GLUCOPHAGE) 1000 MG tablet Take 1,000 mg by mouth 2 (two) times daily. 10/15/18  Yes [provider]  Omega-3 1000 MG CAPS Take 1,000 mg by mouth 2 (two) times daily.   Yes [provider]  promethazine (PHENERGAN) 25 MG tablet Take 25 mg by mouth every 6 (six) hours as needed for nausea/vomiting. 10/27/18  Yes [provider]    I have reviewed the patient's current medications.  Labs:  Results for orders placed or performed during the hospital encounter of 10/10/2018 (from the past 48 hour(s))  Comprehensive metabolic panel     Status: Abnormal  Collection Time: 10/25/2018 11:39 AM  Result Value Ref Range   Sodium 137 135 - 145 mmol/L   Potassium 4.2 3.5 - 5.1 mmol/L   Chloride 102 98 - 111 mmol/L   CO2 19 (L) 22 - 32 mmol/L   Glucose, Bld 312 (H) 70 - 99 mg/dL   BUN 15 8 - 23 mg/dL   Creatinine, Ser 1.72 (H) 0.61 - 1.24 mg/dL   Calcium 8.6 (L) 8.9 - 10.3 mg/dL   Total Protein 7.4 6.5 - 8.1 g/dL   Albumin 3.8 3.5 - 5.0 g/dL   AST 381 (H) 15 - 41 U/L   ALT 429 (H) 0 - 44 U/L   Alkaline Phosphatase 67 38 - 126 U/L   Total Bilirubin 3.1 (H) 0.3 - 1.2 mg/dL   GFR calc non Af Amer 39 (L) >60 mL/min   GFR calc Af Amer 46 (L) >60 mL/min    Comment: (NOTE) The eGFR has been calculated using the CKD EPI  equation. This calculation has not been validated in all clinical situations. eGFR's persistently <60 mL/min signify possible Chronic Kidney Disease.    Anion gap 16 (H) 5 - 15    Comment: Performed at [facility], [place] [address] [place], [place] [address]  CBC     Status: Abnormal   Collection Time: 11/05/2018 11:39 AM  Result Value Ref Range   WBC 20.9 (H) 4.0 - 10.5 K/uL   RBC 6.26 (H) 4.22 - 5.81 MIL/uL   Hemoglobin 17.8 (H) 13.0 - 17.0 g/dL   HCT 55.6 (H) 39.0 - 52.0 %   MCV 88.8 80.0 - 100.0 fL   MCH 28.4 26.0 - 34.0 pg   MCHC 32.0 30.0 - 36.0 g/dL   RDW 12.5 11.5 - 15.5 %   Platelets 236 150 - 400 K/uL   nRBC 0.0 0.0 - 0.2 %    Comment: Performed at [facility], [place] [address] [place], [place] [address]  HIV antibody (Routine Testing)     Status: None   Collection Time: 10/14/2018 11:39 AM  Result Value Ref Range   HIV Screen 4th Generation wRfx Non Reactive Non Reactive    Comment: (NOTE) Performed At: [facility] [address] [place], [place] [id number] Rush Farmer MD QP:[id number]   Lipase, blood     Status: Abnormal   Collection Time: 10/27/2018  3:24 PM  Result Value Ref Range   Lipase 3,700 (H) 11 - 51 U/L    Comment: RESULTS CONFIRMED BY MANUAL DILUTION Performed at [facility], [address], [place], [place] [address]   Comprehensive metabolic panel     Status: Abnormal   Collection Time: 10/09/2018 11:23 PM  Result Value Ref Range   Sodium 140 135 - 145 mmol/L   Potassium 4.7 3.5 - 5.1 mmol/L   Chloride 109 98 - 111 mmol/L   CO2 17 (L) 22 - 32 mmol/L   Glucose, Bld 215 (H) 70 - 99 mg/dL   BUN 27 (H) 8 - 23 mg/dL   Creatinine, Ser 2.98 (H) 0.61 - 1.24 mg/dL    Comment: DELTA CHECK NOTED   Calcium 8.0 (L) 8.9 - 10.3 mg/dL   Total Protein 6.7 6.5 - 8.1 g/dL   Albumin 3.4 (L) 3.5 - 5.0 g/dL   AST 358 (H) 15 - 41 U/L   ALT 422 (H) 0 - 44 U/L   Alkaline Phosphatase 55 38 - 126 U/L   Total Bilirubin 6.3 (H) 0.3 -  1.2 mg/dL    GFR calc non Af Amer 20 (L) >60 mL/min   GFR calc Af Amer 23 (L) >60 mL/min    Comment: (NOTE) The eGFR has been calculated using the CKD EPI equation. This calculation has not been validated in all clinical situations. eGFR's persistently <60 mL/min signify possible Chronic Kidney Disease.    Anion gap 14 5 - 15    Comment: Performed at [facility] [address] [place], [place] [address]  CBC     Status: Abnormal   Collection Time: 10/10/2018 11:23 PM  Result Value Ref Range   WBC 29.7 (H) 4.0 - 10.5 K/uL   RBC 6.12 (H) 4.22 - 5.81 MIL/uL   Hemoglobin 17.4 (H) 13.0 - 17.0 g/dL   HCT 54.8 (H) 39.0 - 52.0 %   MCV 89.5 80.0 - 100.0 fL   MCH 28.4 26.0 - 34.0 pg   MCHC 31.8 30.0 - 36.0 g/dL   RDW 12.8 11.5 - 15.5 %   Platelets 183 150 - 400 K/uL   nRBC 0.0 0.0 - 0.2 %    Comment: Performed at [facility], [place] [address], [place], [place] [address]  Hemoglobin A1c     Status: Abnormal   Collection Time: 10/25/2018 11:23 PM  Result Value Ref Range   Hgb A1c MFr Bld 8.0 (H) 4.8 - 5.6 %    Comment: (NOTE) Pre diabetes:          5.7%-6.4% Diabetes:              >6.4% Glycemic control for   <7.0% adults with diabetes    Mean Plasma Glucose 182.9 mg/dL    Comment: Performed at [facility] [address] [place], [place] [address]  Glucose, capillary     Status: Abnormal   Collection Time: 10/29/18 12:13 AM  Result Value Ref Range   Glucose-Capillary 207 (H) 70 - 99 mg/dL  Glucose, capillary     Status: Abnormal   Collection Time: 10/29/18  3:55 AM  Result Value Ref Range   Glucose-Capillary 178 (H) 70 - 99 mg/dL  Glucose, capillary     Status: Abnormal   Collection Time: 10/29/18  8:47 AM  Result Value Ref Range   Glucose-Capillary 209 (H) 70 - 99 mg/dL  CBC     Status: Abnormal   Collection Time: 10/29/18 11:25 AM  Result Value Ref Range   WBC 26.9 (H) 4.0 - 10.5 K/uL   RBC 5.23 4.22 - 5.81 MIL/uL   Hemoglobin 14.8 13.0 - 17.0 g/dL   HCT 47.5 39.0  - 52.0 %   MCV 90.8 80.0 - 100.0 fL   MCH 28.3 26.0 - 34.0 pg   MCHC 31.2 30.0 - 36.0 g/dL   RDW 13.2 11.5 - 15.5 %   Platelets 158 150 - 400 K/uL   nRBC 0.0 0.0 - 0.2 %    Comment: Performed at [facility], [place] [address] [place], [place] [address]  Comprehensive metabolic panel     Status: Abnormal   Collection Time: 10/29/18 11:25 AM  Result Value Ref Range   Sodium 139 135 - 145 mmol/L   Potassium 5.2 (H) 3.5 - 5.1 mmol/L   Chloride 109 98 - 111 mmol/L   CO2 17 (L) 22 - 32 mmol/L   Glucose, Bld 211 (H) 70 - 99 mg/dL   BUN 44 (H) 8 - 23 mg/dL   Creatinine, Ser 4.91 (H) 0.61 - 1.24 mg/dL  Calcium 6.8 (L) 8.9 - 10.3 mg/dL   Total Protein 6.4 (L) 6.5 - 8.1 g/dL   Albumin 2.9 (L) 3.5 - 5.0 g/dL   AST 399 (H) 15 - 41 U/L   ALT 374 (H) 0 - 44 U/L   Alkaline Phosphatase 50 38 - 126 U/L   Total Bilirubin 6.3 (H) 0.3 - 1.2 mg/dL   GFR calc non Af Amer 11 (L) >60 mL/min   GFR calc Af Amer 13 (L) >60 mL/min    Comment: (NOTE) The eGFR has been calculated using the CKD EPI equation. This calculation has not been validated in all clinical situations. eGFR's persistently <60 mL/min signify possible Chronic Kidney Disease.    Anion gap 13 5 - 15    Comment: Performed at [facility] [address], [place], [place] [address]  Lipase, blood     Status: Abnormal   Collection Time: 10/29/18 11:25 AM  Result Value Ref Range   Lipase 3,361 (H) 11 - 51 U/L    Comment: RESULTS CONFIRMED BY MANUAL DILUTION Performed at [facility], [place] [address], [place], [place] [address]   Glucose, capillary     Status: Abnormal   Collection Time: 10/29/18  1:21 PM  Result Value Ref Range   Glucose-Capillary 165 (H) 70 - 99 mg/dL     ROS:  Pertinent items are noted in HPI.  Physical Exam: Vitals:   10/29/18 1437 10/29/18 1629  BP: 106/64 (!) 113/56  Pulse: (!) 106 (!) 111  Resp: 18 20  Temp: 98.3 F (36.8 C) 97.7 F (36.5 C)  SpO2: 95% 92%     General:ill  appearing, groaning, vomiting HEENT: MM tacky Eyes: EOMI Neck: no JVD Heart: tachycardic, regular, no rub or S4 Lungs: clear to bases, 96% on RA at rest Abdomen: very TTP, guarding, quiet BS, firm Extremities: trace ankle edema Skin: warm and dry Neuro: grossly nonfocal  Assessment/Plan:  **Severe AKI, oliguric:  Unclear baseline, but no reported h/o CKD and Cr yesterday 1.7, now 4.91.  Course consistent with ATN + CIN (by report contrasted CT at [facility] though I do not have confirmation).  MRCP showed likely cysts, but no obstruction.  He's been oliguric despite IV hydration today and I'm concerned his kidneys may continue to worsen.  UA, urine indices for FeNa are ordered.  He has mild edema, but pulmonary status ok for now; given net +2L today will decrease rate of IVF from 150 to 58m/hr and please have low threshold to discontinue if develops O2 requirement.   No indications for RRT but may develop in next 24-48h and I've spoken with him regarding the possibility.   **Metabolic acidosis:  Will switch IVF to bicarb containing fluids as he's not tolerating po and I expect his acidosis will continue to worsen.   **Hyperkalemia, mild: monitor, no specific therapy at this time.  **Acute pancreatitis: gallstone per report on OSH imaging.  GI and surgery following.  Supportive care per primary. Renal dosed pip/tazo.   **DM: A1c 8 on 10/26/2018.  Care per primary.   **HTN: hypotension in setting of acute issues, holding meds.   LJustin Mend11/21/2019, 6:42 PM

## 2018-10-29 NOTE — Progress Notes (Signed)
[facility] Progress Note     Subjective: CC-  Just back from MRCP. Patient states that he continues to have significant diffuse abdominal pain. Some nausea, no emesis.  No labs were ordered for today. Afebrile, BP 90/61  Objective: Vital signs in last 24 hours: Temp:  [97.5 F (36.4 C)-98.4 F (36.9 C)] 98.2 F (36.8 C) (11/21 0557) Pulse Rate:  [108-115] 108 (11/21 0557) Resp:  [18] 18 (11/20 1456) BP: (85-139)/(54-84) 90/61 (11/21 1030) SpO2:  [94 %-99 %] 94 % (11/21 0557) Last BM Date: 10/27/18  Intake/Output from previous day: 11/20 0701 - 11/21 0700 In: 1901.8 [I.V.:1759.6; IV Piggyback:142.3] Out: -  Intake/Output this shift: No intake/output data recorded.  PE: Gen:  Alert, NAD HEENT: EOM's intact, pupils equal and round Pulm:  effort normal Abd: distended but soft, hypoactive BS, no masses, hernias, or organomegaly. Diffuse tenderness, moderately TTP LUQ and RUQ with voluntary guarding, no peritonitis  Psych: A&Ox3  Skin: no rashes noted, warm and dry  Lab Results:  Recent Labs    10/22/2018 1139 10/11/2018 2323  WBC 20.9* 29.7*  HGB 17.8* 17.4*  HCT 55.6* 54.8*  PLT 236 183   BMET Recent Labs    10/31/2018 1139 11/07/2018 2323  NA 137 140  K 4.2 4.7  CL 102 109  CO2 19* 17*  GLUCOSE 312* 215*  BUN 15 27*  CREATININE 1.72* 2.98*  CALCIUM 8.6* 8.0*   PT/INR No results for input(s): LABPROT, INR in the last 72 hours. CMP     Component Value Date/Time   NA 140 10/19/2018 2323   K 4.7 10/23/2018 2323   CL 109 10/13/2018 2323   CO2 17 (L) 10/13/2018 2323   GLUCOSE 215 (H) 10/12/2018 2323   BUN 27 (H) 10/09/2018 2323   CREATININE 2.98 (H) 10/30/2018 2323   CALCIUM 8.0 (L) 10/18/2018 2323   PROT 6.7 10/21/2018 2323   ALBUMIN 3.4 (L) 10/11/2018 2323   AST 358 (H) 10/16/2018 2323   ALT 422 (H) 11/02/2018 2323   ALKPHOS 55 10/29/2018 2323   BILITOT 6.3 (H) 10/21/2018 2323   GFRNONAA 20 (L) 10/29/2018 2323   GFRAA 23 (L)  10/21/2018 2323   Lipase     Component Value Date/Time   LIPASE 3,700 (H) 10/14/2018 1524       Studies/Results: Mr Abdomen Mrcp Wo Contrast  Result Date: 10/29/2018 CLINICAL DATA:  67 year old male with history of abdominal pain for 1 day. Diffuse pain. Multiple episodes of emesis. Evaluate for potential pancreatitis. EXAM: MRI ABDOMEN WITHOUT CONTRAST  (INCLUDING MRCP) TECHNIQUE: Multiplanar multisequence MR imaging of the abdomen was performed. Heavily T2-weighted images of the biliary and pancreatic ducts were obtained, and three-dimensional MRCP images were rendered by post processing. COMPARISON:  No prior abdominal MRI. CT the abdomen and pelvis 10/27/2018. FINDINGS: Comment: Study is limited for detection and characterization of visceral and/or vascular lesions by lack of IV gadolinium. Lower chest: Trace bilateral pleural effusions lying dependently with areas of increased signal intensity in the lung bases which could reflect there is of atelectasis or sequela of recent aspiration. Hepatobiliary: Heterogeneous loss of signal intensity throughout the hepatic parenchyma on out of phase dual echo images, indicative of hepatic steatosis. No discrete cystic or solid hepatic lesions are confidently identified on today's noncontrast examination. No intra or extrahepatic biliary ductal dilatation noted on MRCP images. Common bile duct measures 5 mm in the porta hepatis. No filling defects in the common bile duct to suggest choledocholithiasis. No gallstones. Gallbladder is  grossly unremarkable in appearance. Pancreas: Diffusely increased T2 signal intensity throughout the pancreas, as well as in the adjacent peripancreatic retroperitoneum. Some of this peripancreatic fluid is also T1 hyperintense, suggesting some proteinaceous/hemorrhagic contents. No discrete pancreatic mass. No pancreatic ductal dilatation noted on MRCP images. Spleen:  Unremarkable. Adrenals/Urinary Tract: Subcentimeter T1  hypointense, T2 hyperintense lesions in the kidneys bilaterally, incompletely characterized on today's noncontrast examination, but statistically likely to represent small cysts. No hydroureteronephrosis in the visualized portions of the abdomen. Bilateral adrenal glands are normal in appearance. Stomach/Bowel: Visualized portions are unremarkable. Vascular/Lymphatic: No aneurysm identified in the visualized abdominal vasculature. No lymphadenopathy noted in the abdomen. Other: Extensive edema throughout the retroperitoneum. Small volume of ascites most evident in the pericolic gutters bilaterally. Musculoskeletal: No aggressive appearing osseous lesions are noted in the visualized portions of the skeleton. IMPRESSION: 1. Changes of acute pancreatitis, with extensive pancreatic and peripancreatic inflammatory changes, including proteinaceous and/or hemorrhagic peripancreatic fluid. 2. No choledocholithiasis or findings of biliary tract obstruction. 3. Small volume of ascites. 4. Trace bilateral pleural effusions. 5. Increased signal intensity in the lung bases bilaterally which could reflect areas of passive subsegmental atelectasis, however, the possibility of aspiration pneumonitis should be considered. Correlation with chest radiographs is recommended. Electronically Signed   By: Vinnie Langton M.D.   On: 10/29/2018 10:46   Mr 3d Recon At Scanner  Result Date: 10/29/2018 CLINICAL DATA:  67 year old male with history of abdominal pain for 1 day. Diffuse pain. Multiple episodes of emesis. Evaluate for potential pancreatitis. EXAM: MRI ABDOMEN WITHOUT CONTRAST  (INCLUDING MRCP) TECHNIQUE: Multiplanar multisequence MR imaging of the abdomen was performed. Heavily T2-weighted images of the biliary and pancreatic ducts were obtained, and three-dimensional MRCP images were rendered by post processing. COMPARISON:  No prior abdominal MRI. CT the abdomen and pelvis 10/27/2018. FINDINGS: Comment: Study is limited  for detection and characterization of visceral and/or vascular lesions by lack of IV gadolinium. Lower chest: Trace bilateral pleural effusions lying dependently with areas of increased signal intensity in the lung bases which could reflect there is of atelectasis or sequela of recent aspiration. Hepatobiliary: Heterogeneous loss of signal intensity throughout the hepatic parenchyma on out of phase dual echo images, indicative of hepatic steatosis. No discrete cystic or solid hepatic lesions are confidently identified on today's noncontrast examination. No intra or extrahepatic biliary ductal dilatation noted on MRCP images. Common bile duct measures 5 mm in the porta hepatis. No filling defects in the common bile duct to suggest choledocholithiasis. No gallstones. Gallbladder is grossly unremarkable in appearance. Pancreas: Diffusely increased T2 signal intensity throughout the pancreas, as well as in the adjacent peripancreatic retroperitoneum. Some of this peripancreatic fluid is also T1 hyperintense, suggesting some proteinaceous/hemorrhagic contents. No discrete pancreatic mass. No pancreatic ductal dilatation noted on MRCP images. Spleen:  Unremarkable. Adrenals/Urinary Tract: Subcentimeter T1 hypointense, T2 hyperintense lesions in the kidneys bilaterally, incompletely characterized on today's noncontrast examination, but statistically likely to represent small cysts. No hydroureteronephrosis in the visualized portions of the abdomen. Bilateral adrenal glands are normal in appearance. Stomach/Bowel: Visualized portions are unremarkable. Vascular/Lymphatic: No aneurysm identified in the visualized abdominal vasculature. No lymphadenopathy noted in the abdomen. Other: Extensive edema throughout the retroperitoneum. Small volume of ascites most evident in the pericolic gutters bilaterally. Musculoskeletal: No aggressive appearing osseous lesions are noted in the visualized portions of the skeleton. IMPRESSION:  1. Changes of acute pancreatitis, with extensive pancreatic and peripancreatic inflammatory changes, including proteinaceous and/or hemorrhagic peripancreatic fluid. 2. No choledocholithiasis or findings of  biliary tract obstruction. 3. Small volume of ascites. 4. Trace bilateral pleural effusions. 5. Increased signal intensity in the lung bases bilaterally which could reflect areas of passive subsegmental atelectasis, however, the possibility of aspiration pneumonitis should be considered. Correlation with chest radiographs is recommended. Electronically Signed   By: Vinnie Langton M.D.   On: 10/29/2018 10:46    Anti-infectives: Anti-infectives (From admission, onward)   Start     Dose/Rate Route Frequency Ordered Stop   11/02/2018 2000  piperacillin-tazobactam (ZOSYN) IVPB 3.375 g     3.375 g 12.5 mL/hr over 240 Minutes Intravenous Every 8 hours 10/26/2018 1156     10/15/2018 1230  piperacillin-tazobactam (ZOSYN) IVPB 3.375 g     3.375 g 100 mL/hr over 30 Minutes Intravenous  Once 10/23/2018 1156 10/14/2018 1506       Assessment/Plan DM HTN HLD  Acute pancreatitis Cholelithiasis, possible choledocholithiasis - transfer from [facility], initial lipase >50,000 - MRCP 11/21 reports extensive pancreatitis, possible hemorrhagic peripancreatic fluid; no choledocholithiasis - Repeat CBC/CMP/lipase. Continue bowel rest, IVF resuscitation, and management of pancreatitis per GI/primary. Patient will need laparoscopic cholecystectomy later this admission once pancreatitis has resolved.  ID - zosyn 11/20>> VTE - SCDs, lovenox FEN - IVF at 125cc/hr, NPO Foley - none Follow up - TBD   LOS: 1 day    Wellington Hampshire , [facility] 10/29/2018, 11:18 AM Pager: [phone] Mon 7:00 am -11:30 AM Tues-Fri 7:00 am-4:30 pm Sat-Sun 7:00 am-11:30 am

## 2018-10-29 NOTE — Progress Notes (Signed)
Patient received to unit c/o nausea and abd pain. Telemetry applied and CCMD notified. Oriented to room and unit and instructed on continued NPO status. He agrees. Will continue to monitor.

## 2018-10-29 NOTE — Progress Notes (Signed)
Inpatient Diabetes Program Recommendations  AACE/ADA: New Consensus Statement on Inpatient Glycemic Control (2019)  Target Ranges:  Prepandial:   less than 140 mg/dL      Peak postprandial:   less than 180 mg/dL (1-2 hours)      Critically ill patients:  140 - 180 mg/dL   Results for DELMOS, VELAQUEZ (MRN [id number]) as of 10/29/2018 11:17  Ref. Range 10/29/2018 00:13 10/29/2018 03:55 10/29/2018 08:47  Glucose-Capillary Latest Ref Range: 70 - 99 mg/dL 207 (H) 178 (H) 209 (H)   Results for KEYONTA, BARRADAS (MRN [id number]) as of 10/29/2018 11:17  Ref. Range 10/27/2018 23:23  Hemoglobin A1C Latest Ref Range: 4.8 - 5.6 % 8.0 (H)   Review of Glycemic Control  Diabetes history: DM2 Outpatient Diabetes medications: Metformin 1000 mg BID Current orders for Inpatient glycemic control: CBGs Q4H  Inpatient Diabetes Program Recommendations:  Correction (SSI): Please consider ordering Novolog 0-9 units Q4H. HgbA1C: A1C 8% on 10/17/2018 indicating an average glucose of 183 mg/dl over the past 2-3 months.  Thanks, Barnie Alderman, RN, MSN, CDE Diabetes Coordinator Inpatient Diabetes Program [phone] (Team Pager from 8am to 5pm)

## 2018-10-29 NOTE — Progress Notes (Signed)
PROGRESS NOTE    Manuelito [facility]  VVZ:[id number] DOB: 04-Jul-1951 DOA: 10/27/2018 PCP: Cher Nakai, MD    Brief Narrative:   Derek Riley is a 67 y.o. male with medical history significant for HTN, HLD, who presents on 10/19/2018 with abdominal pain in 1 day.  He was seen by his primary care doctor prescribed some additional supportive care with very little improvement.  Because of the worsening pain with multiple episodes of emesis he presented to [facility] ED.  At [facility] ED he was afebrile tachycardic, hypotensive.   Lab work-up significant for white count of 9.6, creatinine 1, ALT 305, AST 593, total bilirubin 3.2, lipase 50,341.  UA unremarkable. CT showed acute pancreatitis.  He was transferred to [facility] and underwent MRCP showing Changes of acute pancreatitis, with extensive pancreatic and peripancreatic inflammatory changes, including proteinaceous and/or hemorrhagic peripancreatic fluid. No choledocholithiasis or findings of biliary tract obstruction.    Assessment & Plan:   Active Problems:   Acute gallstone pancreatitis   HTN (hypertension)   AKI (acute kidney injury) ([place])   Hyperglycemia   Hyperlipidemia   Acute gall stone pancreatitis with SIRS:  No CBD dilatation.  Improving lipase but lfts are still elevated.  GB wall thickening concerning for cholecystitis.  Surgery and GI on board , recommends symptomatic and supportive management. Continue with IV anti emetics, IV fluids and pain control.  Empiric IV zosyn .    AKI  Baseline creatinine at 1, this afternoon increased to 4.9. Probably secondary to to pre renal causes from hypotension, contrast use and dehydration and ATN .  UA unremarkable. No hydronephrosis on MRCP or CT.  Urine output is decreased. Get urine electrolytes to check for feNA.  Strict intake and output.  Nephrology consultation requested.  IV bicarbonate will be added. Continue with IV fluids at 150 ml/hr.     Hypotension Resolved.         DVT prophylaxis: lovenox.  Code Status:full code.  Family Communication: none at bedside.  Disposition Plan:pending clinical improvement.   Consultants:   Renal Dr Johnney Ou.   Gastroenterology. Dr Paulita Fujita  Surgery    Procedures:MRCP.   Antimicrobials:zosyn since admission.   Subjective: Reports feeling miserable, dry heaving, nauseated.  Abdominal pain is persistent.   Objective: Vitals:   10/29/18 1030 10/29/18 1238 10/29/18 1437 10/29/18 1629  BP: 90/61 104/62 106/64   Pulse:  (!) 105 (!) 106   Resp:   18   Temp:  98 F (36.7 C) 98.3 F (36.8 C) 97.7 F (36.5 C)  TempSrc:  Oral Oral Oral  SpO2:   95% 92%  Weight:      Height:        Intake/Output Summary (Last 24 hours) at 10/29/2018 1710 Last data filed at 10/29/2018 1503 Gross per 24 hour  Intake 2257.51 ml  Output 150 ml  Net 2107.51 ml   Filed Weights   10/31/2018 0920  Weight: 87.4 kg    Examination:  General exam: moderate distress from abdominal pain , nausea and vomiting.  Respiratory system: Clear to auscultation. Respiratory effort normal. No wheezing,  Cardiovascular system: S1 & S2 heard, tachycardic,  Gastrointestinal system: Abdomen is soft, severe tenderness, hypoactive bowel movements.  Central nervous system: Alert and oriented. No focal neurological deficits. Extremities: no pedal edema.  Skin: No rashes, lesions or ulcers Psychiatry: anxious.     Data Reviewed: I have personally reviewed following labs and imaging studies  CBC: Recent Labs  Lab 10/31/2018 1139 10/22/2018 2323 10/29/18 1125  WBC 20.9* 29.7* 26.9*  HGB 17.8* 17.4* 14.8  HCT 55.6* 54.8* 47.5  MCV 88.8 89.5 90.8  PLT 236 183 956   Basic Metabolic Panel: Recent Labs  Lab 10/22/2018 1139 10/11/2018 2323 10/29/18 1125  NA 137 140 139  K 4.2 4.7 5.2*  CL 102 109 109  CO2 19* 17* 17*  GLUCOSE 312* 215* 211*  BUN 15 27* 44*  CREATININE 1.72* 2.98* 4.91*  CALCIUM 8.6* 8.0* 6.8*   GFR: Estimated  Creatinine Clearance: 16 mL/min (A) (by C-G formula based on SCr of 4.91 mg/dL (H)). Liver Function Tests: Recent Labs  Lab 10/30/2018 1139 10/27/2018 2323 10/29/18 1125  AST 381* 358* 399*  ALT 429* 422* 374*  ALKPHOS 67 55 50  BILITOT 3.1* 6.3* 6.3*  PROT 7.4 6.7 6.4*  ALBUMIN 3.8 3.4* 2.9*   Recent Labs  Lab 10/23/2018 1524 10/29/18 1125  LIPASE 3,700* 3,361*   No results for input(s): AMMONIA in the last 168 hours. Coagulation Profile: No results for input(s): INR, PROTIME in the last 168 hours. Cardiac Enzymes: No results for input(s): CKTOTAL, CKMB, CKMBINDEX, TROPONINI in the last 168 hours. BNP (last 3 results) No results for input(s): PROBNP in the last 8760 hours. HbA1C: Recent Labs    10/22/2018 2323  HGBA1C 8.0*   CBG: Recent Labs  Lab 10/29/18 0013 10/29/18 0355 10/29/18 0847 10/29/18 1321  GLUCAP 207* 178* 209* 165*   Lipid Profile: No results for input(s): CHOL, HDL, LDLCALC, TRIG, CHOLHDL, LDLDIRECT in the last 72 hours. Thyroid Function Tests: No results for input(s): TSH, T4TOTAL, FREET4, T3FREE, THYROIDAB in the last 72 hours. Anemia Panel: No results for input(s): VITAMINB12, FOLATE, FERRITIN, TIBC, IRON, RETICCTPCT in the last 72 hours. Sepsis Labs: No results for input(s): PROCALCITON, LATICACIDVEN in the last 168 hours.  No results found for this or any previous visit (from the past 240 hour(s)).       Radiology Studies: Mr Abdomen Mrcp Wo Contrast  Result Date: 10/29/2018 CLINICAL DATA:  67 year old male with history of abdominal pain for 1 day. Diffuse pain. Multiple episodes of emesis. Evaluate for potential pancreatitis. EXAM: MRI ABDOMEN WITHOUT CONTRAST  (INCLUDING MRCP) TECHNIQUE: Multiplanar multisequence MR imaging of the abdomen was performed. Heavily T2-weighted images of the biliary and pancreatic ducts were obtained, and three-dimensional MRCP images were rendered by post processing. COMPARISON:  No prior abdominal MRI. CT the  abdomen and pelvis 10/27/2018. FINDINGS: Comment: Study is limited for detection and characterization of visceral and/or vascular lesions by lack of IV gadolinium. Lower chest: Trace bilateral pleural effusions lying dependently with areas of increased signal intensity in the lung bases which could reflect there is of atelectasis or sequela of recent aspiration. Hepatobiliary: Heterogeneous loss of signal intensity throughout the hepatic parenchyma on out of phase dual echo images, indicative of hepatic steatosis. No discrete cystic or solid hepatic lesions are confidently identified on today's noncontrast examination. No intra or extrahepatic biliary ductal dilatation noted on MRCP images. Common bile duct measures 5 mm in the porta hepatis. No filling defects in the common bile duct to suggest choledocholithiasis. No gallstones. Gallbladder is grossly unremarkable in appearance. Pancreas: Diffusely increased T2 signal intensity throughout the pancreas, as well as in the adjacent peripancreatic retroperitoneum. Some of this peripancreatic fluid is also T1 hyperintense, suggesting some proteinaceous/hemorrhagic contents. No discrete pancreatic mass. No pancreatic ductal dilatation noted on MRCP images. Spleen:  Unremarkable. Adrenals/Urinary Tract: Subcentimeter T1 hypointense, T2 hyperintense lesions in the kidneys bilaterally, incompletely characterized on today's noncontrast  examination, but statistically likely to represent small cysts. No hydroureteronephrosis in the visualized portions of the abdomen. Bilateral adrenal glands are normal in appearance. Stomach/Bowel: Visualized portions are unremarkable. Vascular/Lymphatic: No aneurysm identified in the visualized abdominal vasculature. No lymphadenopathy noted in the abdomen. Other: Extensive edema throughout the retroperitoneum. Small volume of ascites most evident in the pericolic gutters bilaterally. Musculoskeletal: No aggressive appearing osseous lesions  are noted in the visualized portions of the skeleton. IMPRESSION: 1. Changes of acute pancreatitis, with extensive pancreatic and peripancreatic inflammatory changes, including proteinaceous and/or hemorrhagic peripancreatic fluid. 2. No choledocholithiasis or findings of biliary tract obstruction. 3. Small volume of ascites. 4. Trace bilateral pleural effusions. 5. Increased signal intensity in the lung bases bilaterally which could reflect areas of passive subsegmental atelectasis, however, the possibility of aspiration pneumonitis should be considered. Correlation with chest radiographs is recommended. Electronically Signed   By: Vinnie Langton M.D.   On: 10/29/2018 10:46   Mr 3d Recon At Scanner  Result Date: 10/29/2018 CLINICAL DATA:  67 year old male with history of abdominal pain for 1 day. Diffuse pain. Multiple episodes of emesis. Evaluate for potential pancreatitis. EXAM: MRI ABDOMEN WITHOUT CONTRAST  (INCLUDING MRCP) TECHNIQUE: Multiplanar multisequence MR imaging of the abdomen was performed. Heavily T2-weighted images of the biliary and pancreatic ducts were obtained, and three-dimensional MRCP images were rendered by post processing. COMPARISON:  No prior abdominal MRI. CT the abdomen and pelvis 10/27/2018. FINDINGS: Comment: Study is limited for detection and characterization of visceral and/or vascular lesions by lack of IV gadolinium. Lower chest: Trace bilateral pleural effusions lying dependently with areas of increased signal intensity in the lung bases which could reflect there is of atelectasis or sequela of recent aspiration. Hepatobiliary: Heterogeneous loss of signal intensity throughout the hepatic parenchyma on out of phase dual echo images, indicative of hepatic steatosis. No discrete cystic or solid hepatic lesions are confidently identified on today's noncontrast examination. No intra or extrahepatic biliary ductal dilatation noted on MRCP images. Common bile duct measures 5 mm  in the porta hepatis. No filling defects in the common bile duct to suggest choledocholithiasis. No gallstones. Gallbladder is grossly unremarkable in appearance. Pancreas: Diffusely increased T2 signal intensity throughout the pancreas, as well as in the adjacent peripancreatic retroperitoneum. Some of this peripancreatic fluid is also T1 hyperintense, suggesting some proteinaceous/hemorrhagic contents. No discrete pancreatic mass. No pancreatic ductal dilatation noted on MRCP images. Spleen:  Unremarkable. Adrenals/Urinary Tract: Subcentimeter T1 hypointense, T2 hyperintense lesions in the kidneys bilaterally, incompletely characterized on today's noncontrast examination, but statistically likely to represent small cysts. No hydroureteronephrosis in the visualized portions of the abdomen. Bilateral adrenal glands are normal in appearance. Stomach/Bowel: Visualized portions are unremarkable. Vascular/Lymphatic: No aneurysm identified in the visualized abdominal vasculature. No lymphadenopathy noted in the abdomen. Other: Extensive edema throughout the retroperitoneum. Small volume of ascites most evident in the pericolic gutters bilaterally. Musculoskeletal: No aggressive appearing osseous lesions are noted in the visualized portions of the skeleton. IMPRESSION: 1. Changes of acute pancreatitis, with extensive pancreatic and peripancreatic inflammatory changes, including proteinaceous and/or hemorrhagic peripancreatic fluid. 2. No choledocholithiasis or findings of biliary tract obstruction. 3. Small volume of ascites. 4. Trace bilateral pleural effusions. 5. Increased signal intensity in the lung bases bilaterally which could reflect areas of passive subsegmental atelectasis, however, the possibility of aspiration pneumonitis should be considered. Correlation with chest radiographs is recommended. Electronically Signed   By: Vinnie Langton M.D.   On: 10/29/2018 10:46        Scheduled  Meds: . aspirin EC   81 mg Oral Daily  . enoxaparin (LOVENOX) injection  40 mg Subcutaneous Q24H  . lisinopril  10 mg Oral Daily  . metoCLOPramide (REGLAN) injection  5 mg Intravenous Q6H   Continuous Infusions: . sodium chloride 125 mL/hr at 10/29/18 1459  . piperacillin-tazobactam (ZOSYN)  IV 3.375 g (10/29/18 1159)     LOS: 1 day    Time spent: 35 minutes.     Hosie Poisson, MD [facility] Pager [phone]  If 7PM-7AM, please contact night-coverage [email or web address] Password [email or web address] 10/29/2018, 5:10 PM

## 2018-10-29 NOTE — Progress Notes (Signed)
Pharmacy Antibiotic Note  Derek Riley is a 67 y.o. male admitted on 11/03/2018 with intra-abdominal infection.  Pharmacy has been consulted for Zosyn dosing. WBC elevated at 20.9. SCr 1.72>>4.91 with CrCl ~ 15  Plan: -Change zosyn to 2.25gm IV Q6h -Monitor cultures and clinical progress   Height: 6' (182.9 cm) Weight: 192 lb 10.9 oz (87.4 kg) IBW/kg (Calculated) : 77.6  Temp (24hrs), Avg:98.1 F (36.7 C), Min:97.7 F (36.5 C), Max:98.4 F (36.9 C)  Recent Labs  Lab 10/27/2018 1139 10/12/2018 2323 10/29/18 1125  WBC 20.9* 29.7* 26.9*  CREATININE 1.72* 2.98* 4.91*    Estimated Creatinine Clearance: 16 mL/min (A) (by C-G formula based on SCr of 4.91 mg/dL (H)).    No Known Allergies  Antimicrobials this admission: Zosyn 11/20 >>      Thank you for allowing pharmacy to be a part of this patient's care.  Hildred Laser, PharmD Clinical Pharmacist **Pharmacist phone directory can now be found on [email or web address] ([email or web address]).  Listed under [place].

## 2018-10-29 NOTE — Progress Notes (Signed)
Subjective: Ongoing significant abdominal pain.  Objective: Vital signs in last 24 hours: Temp:  [97.5 F (36.4 C)-98.4 F (36.9 C)] 98 F (36.7 C) (11/21 1238) Pulse Rate:  [105-115] 105 (11/21 1238) Resp:  [18] 18 (11/20 1456) BP: (85-139)/(54-84) 104/62 (11/21 1238) SpO2:  [94 %-99 %] 94 % (11/21 0557) Weight change:  Last BM Date: 10/27/18  PE: GEN:  Uncomfortable-appearing ABD:  Protuberant, generalized tenderness, voluntary guarding, scant bowel sounds  Lab Results: CBC    Component Value Date/Time   WBC 26.9 (H) 10/29/2018 1125   RBC 5.23 10/29/2018 1125   HGB 14.8 10/29/2018 1125   HCT 47.5 10/29/2018 1125   PLT 158 10/29/2018 1125   MCV 90.8 10/29/2018 1125   MCH 28.3 10/29/2018 1125   MCHC 31.2 10/29/2018 1125   RDW 13.2 10/29/2018 1125   CMP     Component Value Date/Time   NA 139 10/29/2018 1125   K 5.2 (H) 10/29/2018 1125   CL 109 10/29/2018 1125   CO2 17 (L) 10/29/2018 1125   GLUCOSE 211 (H) 10/29/2018 1125   BUN 44 (H) 10/29/2018 1125   CREATININE 4.91 (H) 10/29/2018 1125   CALCIUM 6.8 (L) 10/29/2018 1125   PROT 6.4 (L) 10/29/2018 1125   ALBUMIN 2.9 (L) 10/29/2018 1125   AST 399 (H) 10/29/2018 1125   ALT 374 (H) 10/29/2018 1125   ALKPHOS 50 10/29/2018 1125   BILITOT 6.3 (H) 10/29/2018 1125   GFRNONAA 11 (L) 10/29/2018 1125   GFRAA 13 (L) 10/29/2018 1125   Studies/Results: MRCP:  Extensive pancreatitis with possible hemorrhage; no CBD dilatation or choledocholithiasis  Assessment:  1.  Gallstone pancreatitis.  Severe. 2.  Abdominal pain, ongoing.  No perforation or biliary obstruction seen. 3.  Elevated LFTs, no biliary ductal dilatation or choledocholithiasis seen.  Plan:  1.  Medical management of pancreatitis (IV fluids, judicious analgesics, npo except ice chips). 2.  No current indication for ERCP based on imaging results. 3.  Cholecystectomy understandably on hold until pancreatitis improves. 4.  I suspect patient will require  several days of medical management of pancreatitis before there is even a consideration of cholecystectomy. 5.  [facility] following. 5.  [facility] will follow.   Landry Dyke 10/29/2018, 1:58 PM   Cell [phone] If no answer or after 5 PM call [phone]

## 2018-10-30 ENCOUNTER — Inpatient Hospital Stay (HOSPITAL_COMMUNITY): Payer: Medicare Other

## 2018-10-30 DIAGNOSIS — J9601 Acute respiratory failure with hypoxia: Secondary | ICD-10-CM

## 2018-10-30 DIAGNOSIS — J690 Pneumonitis due to inhalation of food and vomit: Secondary | ICD-10-CM

## 2018-10-30 DIAGNOSIS — N179 Acute kidney failure, unspecified: Secondary | ICD-10-CM

## 2018-10-30 LAB — POCT I-STAT 3, ART BLOOD GAS (G3+)
Acid-base deficit: 8 mmol/L — ABNORMAL HIGH (ref 0.0–2.0)
BICARBONATE: 19.9 mmol/L — AB (ref 20.0–28.0)
O2 SAT: 99 %
TCO2: 21 mmol/L — AB (ref 22–32)
pCO2 arterial: 47.3 mmHg (ref 32.0–48.0)
pH, Arterial: 7.232 — ABNORMAL LOW (ref 7.350–7.450)
pO2, Arterial: 171 mmHg — ABNORMAL HIGH (ref 83.0–108.0)

## 2018-10-30 LAB — GLUCOSE, CAPILLARY
GLUCOSE-CAPILLARY: 224 mg/dL — AB (ref 70–99)
GLUCOSE-CAPILLARY: 240 mg/dL — AB (ref 70–99)
Glucose-Capillary: 128 mg/dL — ABNORMAL HIGH (ref 70–99)
Glucose-Capillary: 145 mg/dL — ABNORMAL HIGH (ref 70–99)
Glucose-Capillary: 191 mg/dL — ABNORMAL HIGH (ref 70–99)
Glucose-Capillary: 237 mg/dL — ABNORMAL HIGH (ref 70–99)
Glucose-Capillary: 239 mg/dL — ABNORMAL HIGH (ref 70–99)

## 2018-10-30 LAB — COMPREHENSIVE METABOLIC PANEL
ALK PHOS: 48 U/L (ref 38–126)
ALT: 260 U/L — ABNORMAL HIGH (ref 0–44)
ANION GAP: 16 — AB (ref 5–15)
AST: 207 U/L — AB (ref 15–41)
Albumin: 2.3 g/dL — ABNORMAL LOW (ref 3.5–5.0)
BILIRUBIN TOTAL: 4 mg/dL — AB (ref 0.3–1.2)
BUN: 77 mg/dL — ABNORMAL HIGH (ref 8–23)
CALCIUM: 5.8 mg/dL — AB (ref 8.9–10.3)
CO2: 13 mmol/L — AB (ref 22–32)
Chloride: 110 mmol/L (ref 98–111)
Creatinine, Ser: 6.95 mg/dL — ABNORMAL HIGH (ref 0.61–1.24)
GFR calc Af Amer: 8 mL/min — ABNORMAL LOW (ref >60)
GFR, EST NON AFRICAN AMERICAN: 7 mL/min — AB (ref >60)
Glucose, Bld: 244 mg/dL — ABNORMAL HIGH (ref 70–99)
POTASSIUM: 5 mmol/L (ref 3.5–5.1)
Sodium: 139 mmol/L (ref 135–145)
Total Protein: 5.9 g/dL — ABNORMAL LOW (ref 6.5–8.1)

## 2018-10-30 LAB — CBC
HCT: 38.6 % — ABNORMAL LOW (ref 39.0–52.0)
HCT: 34.8 % — ABNORMAL LOW (ref 39.0–52.0)
HEMOGLOBIN: 11.5 g/dL — AB (ref 13.0–17.0)
Hemoglobin: 12.3 g/dL — ABNORMAL LOW (ref 13.0–17.0)
MCH: 29.2 pg (ref 26.0–34.0)
MCH: 29.5 pg (ref 26.0–34.0)
MCHC: 31.9 g/dL (ref 30.0–36.0)
MCHC: 33 g/dL (ref 30.0–36.0)
MCV: 91.7 fL (ref 80.0–100.0)
MCV: 89.2 fL (ref 80.0–100.0)
NRBC: 0 % (ref 0.0–0.2)
NRBC: 0.1 % (ref 0.0–0.2)
PLATELETS: 147 10*3/uL — AB (ref 150–400)
PLATELETS: 149 10*3/uL — AB (ref 150–400)
RBC: 4.21 MIL/uL — AB (ref 4.22–5.81)
RBC: 3.9 MIL/uL — AB (ref 4.22–5.81)
RDW: 13.6 % (ref 11.5–15.5)
RDW: 13.9 % (ref 11.5–15.5)
WBC: 19.8 10*3/uL — ABNORMAL HIGH (ref 4.0–10.5)
WBC: 29.1 10*3/uL — ABNORMAL HIGH (ref 4.0–10.5)

## 2018-10-30 LAB — RENAL FUNCTION PANEL
ANION GAP: 15 (ref 5–15)
Albumin: 2.1 g/dL — ABNORMAL LOW (ref 3.5–5.0)
BUN: 77 mg/dL — ABNORMAL HIGH (ref 8–23)
CALCIUM: 5.2 mg/dL — AB (ref 8.9–10.3)
CO2: 25 mmol/L (ref 22–32)
Chloride: 100 mmol/L (ref 98–111)
Creatinine, Ser: 6.02 mg/dL — ABNORMAL HIGH (ref 0.61–1.24)
GFR calc non Af Amer: 9 mL/min — ABNORMAL LOW (ref >60)
GFR, EST AFRICAN AMERICAN: 10 mL/min — AB (ref >60)
Glucose, Bld: 162 mg/dL — ABNORMAL HIGH (ref 70–99)
POTASSIUM: 3.9 mmol/L (ref 3.5–5.1)
Phosphorus: 3.5 mg/dL (ref 2.5–4.6)
SODIUM: 140 mmol/L (ref 135–145)

## 2018-10-30 LAB — MRSA PCR SCREENING: MRSA by PCR: NEGATIVE

## 2018-10-30 LAB — LACTIC ACID, PLASMA: LACTIC ACID, VENOUS: 1.8 mmol/L (ref 0.5–1.9)

## 2018-10-30 MED ORDER — STERILE WATER FOR INJECTION IV SOLN
INTRAVENOUS | Status: DC
  Administered 2018-10-30 – 2018-10-31 (×5): via INTRAVENOUS_CENTRAL
  Filled 2018-10-30 (×8): qty 150

## 2018-10-30 MED ORDER — MIDAZOLAM HCL 2 MG/2ML IJ SOLN
2.0000 mg | INTRAMUSCULAR | Status: DC | PRN
  Administered 2018-10-30 – 2018-11-01 (×2): 2 mg via INTRAVENOUS
  Filled 2018-10-30 (×2): qty 2

## 2018-10-30 MED ORDER — DEXMEDETOMIDINE HCL IN NACL 400 MCG/100ML IV SOLN
0.0000 ug/kg/h | INTRAVENOUS | Status: AC
  Administered 2018-10-30 (×2): 0.8 ug/kg/h via INTRAVENOUS
  Administered 2018-10-30: 0.4 ug/kg/h via INTRAVENOUS
  Administered 2018-10-31: 0.9 ug/kg/h via INTRAVENOUS
  Administered 2018-10-31: 1.2 ug/kg/h via INTRAVENOUS
  Administered 2018-10-31: 0.9 ug/kg/h via INTRAVENOUS
  Administered 2018-10-31: 1.2 ug/kg/h via INTRAVENOUS
  Administered 2018-10-31: 0.8 ug/kg/h via INTRAVENOUS
  Administered 2018-11-01: 0.9 ug/kg/h via INTRAVENOUS
  Administered 2018-11-01: 1 ug/kg/h via INTRAVENOUS
  Administered 2018-11-01: 0.8 ug/kg/h via INTRAVENOUS
  Administered 2018-11-02: 0.7 ug/kg/h via INTRAVENOUS
  Administered 2018-11-02: 0.5 ug/kg/h via INTRAVENOUS
  Filled 2018-10-30 (×16): qty 100

## 2018-10-30 MED ORDER — MIDAZOLAM HCL 2 MG/2ML IJ SOLN
INTRAMUSCULAR | Status: AC
  Filled 2018-10-30: qty 2

## 2018-10-30 MED ORDER — CALCIUM GLUCONATE-NACL 2-0.675 GM/100ML-% IV SOLN
2.0000 g | Freq: Once | INTRAVENOUS | Status: AC
  Administered 2018-10-30: 2000 mg via INTRAVENOUS
  Filled 2018-10-30: qty 100

## 2018-10-30 MED ORDER — MIDAZOLAM HCL 2 MG/2ML IJ SOLN
2.0000 mg | Freq: Once | INTRAMUSCULAR | Status: AC
  Administered 2018-10-30: 2 mg via INTRAVENOUS

## 2018-10-30 MED ORDER — MIDAZOLAM HCL 2 MG/2ML IJ SOLN
2.0000 mg | INTRAMUSCULAR | Status: DC | PRN
  Administered 2018-10-30 – 2018-11-03 (×5): 2 mg via INTRAVENOUS
  Filled 2018-10-30 (×5): qty 2

## 2018-10-30 MED ORDER — SODIUM CHLORIDE 0.9 % IV SOLN: INTRAVENOUS | Status: DC

## 2018-10-30 MED ORDER — SODIUM CHLORIDE 0.9 % IV SOLN
INTRAVENOUS | Status: DC
  Administered 2018-11-01 – 2018-11-05 (×4): via INTRAVENOUS

## 2018-10-30 MED ORDER — SODIUM CHLORIDE 0.9% FLUSH
10.0000 mL | INTRAVENOUS | Status: DC | PRN
  Administered 2018-11-07 – 2018-11-17 (×2): 10 mL
  Administered 2018-11-18: 20 mL
  Administered 2018-11-19: 10 mL
  Filled 2018-10-30 (×4): qty 40

## 2018-10-30 MED ORDER — NOREPINEPHRINE 16 MG/250ML-% IV SOLN
0.0000 ug/min | INTRAVENOUS | Status: DC
  Administered 2018-10-30: 25 ug/min via INTRAVENOUS
  Administered 2018-10-30: 23 ug/min via INTRAVENOUS
  Administered 2018-10-31: 36 ug/min via INTRAVENOUS
  Administered 2018-11-01: 13 ug/min via INTRAVENOUS
  Administered 2018-11-03: 9 ug/min via INTRAVENOUS
  Filled 2018-10-30 (×7): qty 250

## 2018-10-30 MED ORDER — PHENYLEPHRINE HCL-NACL 10-0.9 MG/250ML-% IV SOLN
0.0000 ug/min | INTRAVENOUS | Status: DC
  Filled 2018-10-30: qty 250

## 2018-10-30 MED ORDER — SODIUM CHLORIDE 0.9 % IV SOLN: INTRAVENOUS | Status: DC | PRN

## 2018-10-30 MED ORDER — FENTANYL CITRATE (PF) 100 MCG/2ML IJ SOLN
100.0000 ug | Freq: Once | INTRAMUSCULAR | Status: AC
  Administered 2018-10-30: 100 ug via INTRAVENOUS

## 2018-10-30 MED ORDER — FENTANYL CITRATE (PF) 100 MCG/2ML IJ SOLN
INTRAMUSCULAR | Status: AC
  Filled 2018-10-30: qty 2

## 2018-10-30 MED ORDER — FENTANYL CITRATE (PF) 100 MCG/2ML IJ SOLN: 50.0000 ug | Freq: Once | INTRAMUSCULAR | Status: DC

## 2018-10-30 MED ORDER — FENTANYL BOLUS VIA INFUSION
50.0000 ug | INTRAVENOUS | Status: DC | PRN
  Administered 2018-10-31 – 2018-11-03 (×6): 50 ug via INTRAVENOUS
  Filled 2018-10-30: qty 50

## 2018-10-30 MED ORDER — SODIUM CHLORIDE 0.9 % IV SOLN
INTRAVENOUS | Status: DC
  Filled 2018-10-30 (×8): qty 1000

## 2018-10-30 MED ORDER — SODIUM BICARBONATE 8.4 % IV SOLN
50.0000 meq | Freq: Once | INTRAVENOUS | Status: AC
  Administered 2018-10-30: 50 meq via INTRAVENOUS
  Filled 2018-10-30: qty 50

## 2018-10-30 MED ORDER — SODIUM CHLORIDE 0.9% FLUSH
10.0000 mL | Freq: Two times a day (BID) | INTRAVENOUS | Status: DC
  Administered 2018-10-31 – 2018-11-03 (×3): 10 mL
  Administered 2018-11-03: 40 mL
  Administered 2018-11-05 – 2018-11-14 (×14): 10 mL
  Administered 2018-11-14: 20 mL
  Administered 2018-11-15 – 2018-11-18 (×5): 10 mL
  Administered 2018-11-19: 20 mL
  Administered 2018-11-19: 10 mL
  Administered 2018-11-20: 20 mL
  Administered 2018-11-20 – 2018-11-22 (×4): 10 mL
  Administered 2018-11-22 – 2018-11-23 (×2): 20 mL
  Administered 2018-11-23: 10 mL
  Administered 2018-11-24: 20 mL
  Administered 2018-11-24: 10 mL
  Administered 2018-11-25: 40 mL
  Administered 2018-11-25 – 2018-11-26 (×2): 10 mL

## 2018-10-30 MED ORDER — SODIUM CHLORIDE 0.9 % IV SOLN
INTRAVENOUS | Status: DC
  Administered 2018-10-30 (×2): via INTRAVENOUS

## 2018-10-30 MED ORDER — STERILE WATER FOR INJECTION IV SOLN
INTRAVENOUS | Status: DC
  Administered 2018-10-30 – 2018-10-31 (×10): via INTRAVENOUS_CENTRAL
  Filled 2018-10-30 (×15): qty 150

## 2018-10-30 MED ORDER — FAMOTIDINE IN NACL 20-0.9 MG/50ML-% IV SOLN
20.0000 mg | INTRAVENOUS | Status: DC
  Administered 2018-10-30 – 2018-11-03 (×5): 20 mg via INTRAVENOUS
  Filled 2018-10-30 (×5): qty 50

## 2018-10-30 MED ORDER — ETOMIDATE 2 MG/ML IV SOLN
0.3000 mg/kg | Freq: Once | INTRAVENOUS | Status: AC
  Administered 2018-10-30: 20 mg via INTRAVENOUS

## 2018-10-30 MED ORDER — HEPARIN SODIUM (PORCINE) 1000 UNIT/ML DIALYSIS
1000.0000 [IU] | INTRAMUSCULAR | Status: DC | PRN
  Filled 2018-10-30: qty 6

## 2018-10-30 MED ORDER — LIDOCAINE HCL URETHRAL/MUCOSAL 2 % EX GEL
1.0000 "application " | CUTANEOUS | Status: AC
  Administered 2018-10-30: 1
  Filled 2018-10-30 (×2): qty 5

## 2018-10-30 MED ORDER — CALCIUM GLUCONATE-NACL 1-0.675 GM/50ML-% IV SOLN
1.0000 g | Freq: Once | INTRAVENOUS | Status: AC
  Administered 2018-10-30: 1000 mg via INTRAVENOUS
  Filled 2018-10-30: qty 50

## 2018-10-30 MED ORDER — HEPARIN SODIUM (PORCINE) 5000 UNIT/ML IJ SOLN
5000.0000 [IU] | Freq: Three times a day (TID) | INTRAMUSCULAR | Status: DC
  Administered 2018-10-30 – 2018-11-15 (×46): 5000 [IU] via SUBCUTANEOUS
  Filled 2018-10-30 (×45): qty 1

## 2018-10-30 MED ORDER — PRISMASOL BGK 4/2.5 32-4-2.5 MEQ/L IV SOLN
INTRAVENOUS | Status: DC
  Administered 2018-10-30 – 2018-11-04 (×34): via INTRAVENOUS_CENTRAL
  Filled 2018-10-30 (×42): qty 5000

## 2018-10-30 MED ORDER — NOREPINEPHRINE 4 MG/250ML-% IV SOLN
INTRAVENOUS | Status: AC
  Filled 2018-10-30: qty 250

## 2018-10-30 MED ORDER — CHLORHEXIDINE GLUCONATE 0.12% ORAL RINSE (MEDLINE KIT)
15.0000 mL | Freq: Two times a day (BID) | OROMUCOSAL | Status: DC
  Administered 2018-10-30 – 2018-11-03 (×8): 15 mL via OROMUCOSAL

## 2018-10-30 MED ORDER — ORAL CARE MOUTH RINSE
15.0000 mL | OROMUCOSAL | Status: DC
  Administered 2018-10-30 – 2018-11-03 (×38): 15 mL via OROMUCOSAL

## 2018-10-30 MED ORDER — CHLORHEXIDINE GLUCONATE CLOTH 2 % EX PADS
6.0000 | MEDICATED_PAD | Freq: Every day | CUTANEOUS | Status: DC
  Administered 2018-10-31 – 2018-11-05 (×6): 6 via TOPICAL

## 2018-10-30 MED ORDER — ROCURONIUM BROMIDE 50 MG/5ML IV SOLN
1.0000 mg/kg | Freq: Once | INTRAVENOUS | Status: DC
  Filled 2018-10-30: qty 9.18

## 2018-10-30 MED ORDER — HEPARIN (PORCINE) 2000 UNITS/L FOR CRRT
INTRAVENOUS_CENTRAL | Status: DC | PRN
  Administered 2018-10-31: 06:00:00 via INTRAVENOUS_CENTRAL
  Filled 2018-10-30: qty 1000

## 2018-10-30 MED ORDER — INSULIN ASPART 100 UNIT/ML ~~LOC~~ SOLN
0.0000 [IU] | SUBCUTANEOUS | Status: DC
  Administered 2018-10-30: 7 [IU] via SUBCUTANEOUS
  Administered 2018-10-30: 4 [IU] via SUBCUTANEOUS
  Administered 2018-10-30 – 2018-10-31 (×3): 3 [IU] via SUBCUTANEOUS
  Administered 2018-10-31: 4 [IU] via SUBCUTANEOUS
  Administered 2018-10-31 – 2018-11-02 (×6): 3 [IU] via SUBCUTANEOUS
  Administered 2018-11-04: 4 [IU] via SUBCUTANEOUS
  Administered 2018-11-04 (×5): 3 [IU] via SUBCUTANEOUS
  Administered 2018-11-05 (×2): 4 [IU] via SUBCUTANEOUS
  Administered 2018-11-05: 7 [IU] via SUBCUTANEOUS
  Administered 2018-11-05 (×2): 4 [IU] via SUBCUTANEOUS
  Administered 2018-11-05: 7 [IU] via SUBCUTANEOUS
  Administered 2018-11-06: 4 [IU] via SUBCUTANEOUS
  Administered 2018-11-06: 11 [IU] via SUBCUTANEOUS
  Administered 2018-11-06: 4 [IU] via SUBCUTANEOUS
  Administered 2018-11-06 (×2): 7 [IU] via SUBCUTANEOUS
  Administered 2018-11-06: 11 [IU] via SUBCUTANEOUS
  Administered 2018-11-07 (×4): 4 [IU] via SUBCUTANEOUS
  Administered 2018-11-07 – 2018-11-08 (×2): 3 [IU] via SUBCUTANEOUS

## 2018-10-30 MED ORDER — FENTANYL 2500MCG IN NS 250ML (10MCG/ML) PREMIX INFUSION
25.0000 ug/h | INTRAVENOUS | Status: DC
  Administered 2018-10-30: 50 ug/h via INTRAVENOUS
  Administered 2018-10-30: 225 ug/h via INTRAVENOUS
  Administered 2018-10-31: 250 ug/h via INTRAVENOUS
  Administered 2018-11-01: 200 ug/h via INTRAVENOUS
  Administered 2018-11-02: 275 ug/h via INTRAVENOUS
  Administered 2018-11-02: 210 ug/h via INTRAVENOUS
  Administered 2018-11-03: 175 ug/h via INTRAVENOUS
  Administered 2018-11-03: 275 ug/h via INTRAVENOUS
  Filled 2018-10-30 (×10): qty 250

## 2018-10-30 NOTE — Procedures (Signed)
Patient now intubated and on ventilator support after earlier decompensation. On CRRT that he is tolerating well at this time with pre/post bicarbonate. Keeping even with CVP 11 Abdominal X ray images show distended bowel loops possibly from ileus- now on LIS.  Elmarie Shiley MD [facility]. Office # [phone] Pager # [phone] 12:59 PM

## 2018-10-30 NOTE — Plan of Care (Signed)
  Problem: Health Behavior/Discharge Planning: Goal: Ability to manage health-related needs will improve Outcome: Not Progressing   Problem: Clinical Measurements: Goal: Ability to maintain clinical measurements within normal limits will improve Outcome: Not Progressing   Problem: Activity: Goal: Risk for activity intolerance will decrease Outcome: Not Progressing   Problem: Nutrition: Goal: Adequate nutrition will be maintained Outcome: Not Progressing   Problem: Coping: Goal: Level of anxiety will decrease Outcome: Not Progressing   Problem: Elimination: Goal: Will not experience complications related to bowel motility Outcome: Not Progressing Goal: Will not experience complications related to urinary retention Outcome: Not Progressing   Problem: Pain Managment: Goal: General experience of comfort will improve Outcome: Not Progressing

## 2018-10-30 NOTE — Procedures (Signed)
Central Venous Catheter Insertion Procedure Note Jemario Poitras [facility] [id number] Dec 12, 1950  Procedure: Insertion of Central Venous Catheter Indications: dialysis   Procedure Details Consent: Unable to obtain consent because of emergent medical necessity. Time Out: Verified patient identification, verified procedure, site/side was marked, verified correct patient position, special equipment/implants available, medications/allergies/relevent history reviewed, required imaging and test results available.  Performed  Maximum sterile technique was used including antiseptics, cap, gloves, gown, hand hygiene, mask and sheet. Skin prep: Chlorhexidine; local anesthetic administered A antimicrobial bonded/coated triple lumen catheter was placed in the right internal jugular vein using the Seldinger technique.  Evaluation Blood flow good Complications: No apparent complications Patient did tolerate procedure well. Chest X-ray ordered to verify placement.  CXR: pending.  Clementeen Graham 10/30/2018, 11:43 AM  Erick Colace ACNP-BC [place] Pager # [phone] OR # [phone] if no answer

## 2018-10-30 NOTE — Progress Notes (Signed)
Subjective: Over past 24 hours, patient developed progressive renal failure, hypotension, send to ICU, on CVVHD, intubated, on pressors.  Objective: Vital signs in last 24 hours: Temp:  [97 F (36.1 C)-98.3 F (36.8 C)] 97.8 F (36.6 C) (11/22 1030) Pulse Rate:  [91-120] 91 (11/22 1400) Resp:  [16-36] 16 (11/22 1400) BP: (90-128)/(50-99) 123/66 (11/22 1400) SpO2:  [91 %-100 %] 100 % (11/22 1400) FiO2 (%):  [100 %] 100 % (11/22 1136) Weight:  [91.8 kg] 91.8 kg (11/22 0410) Weight change: 4.4 kg Last BM Date: (PTA)  PE: GEN:  Intubated, sedated ABD:  Mild distended  Lab Results: CBC    Component Value Date/Time   WBC 19.8 (H) 10/30/2018 0606   RBC 4.21 (L) 10/30/2018 0606   HGB 12.3 (L) 10/30/2018 0606   HCT 38.6 (L) 10/30/2018 0606   PLT 147 (L) 10/30/2018 0606   MCV 91.7 10/30/2018 0606   MCH 29.2 10/30/2018 0606   MCHC 31.9 10/30/2018 0606   RDW 13.6 10/30/2018 0606   CMP     Component Value Date/Time   NA 139 10/30/2018 0331   K 5.0 10/30/2018 0331   CL 110 10/30/2018 0331   CO2 13 (L) 10/30/2018 0331   GLUCOSE 244 (H) 10/30/2018 0331   BUN 77 (H) 10/30/2018 0331   CREATININE 6.95 (H) 10/30/2018 0331   CALCIUM 5.8 (LL) 10/30/2018 0331   PROT 5.9 (L) 10/30/2018 0331   ALBUMIN 2.3 (L) 10/30/2018 0331   AST 207 (H) 10/30/2018 0331   ALT 260 (H) 10/30/2018 0331   ALKPHOS 48 10/30/2018 0331   BILITOT 4.0 (H) 10/30/2018 0331   GFRNONAA 7 (L) 10/30/2018 0331   GFRAA 8 (L) 10/30/2018 0331    Assessment:  1.  Pancreatitis, severe, suspected gallstone-mediated. 2.  Acute renal failure, on CVVHD. 3.  Hypotensive, pressor-supported, now on ventilator.  Plan:  1.  Fluid support, CVVHD, supportive care. 2.  [facility] will follow; prognosis poor.   Landry Dyke 10/30/2018, 2:30 PM   Cell [phone] If no answer or after 5 PM call [phone]

## 2018-10-30 NOTE — Progress Notes (Signed)
Patient ID: Derek Riley, male   DOB: [birth year], 67 y.o.   MRN: [id number] [facility] Progress Note   Assessment/ Plan:   1.  Acute kidney injury (unknown baseline renal function): Suspected to be from ATN plus/minus contrast-induced nephropathy.  Unfortunately, he has remained anuric overnight with worsening metabolic acidosis-plans in place to transfer to ICU this morning, initiate CRRT. 2.  Anion gap metabolic acidosis: Started yesterday on isotonic intravenous sodium bicarbonate however, serum bicarbonate levels continue to drop-we will initiate renal replacement therapy which would likely be in the form of CRRT given his current blood pressure/tachycardia. 3.  Acute severe pancreatitis-likely from gallstones: Plans noted for transfer to ICU, continue supportive management with plans noted for cholecystectomy in the future. 4.  SIRS: Associated with severe pancreatitis, fluid bolus ordered by surgical service.  Ongoing supportive management  Subjective:   Reports to be uncomfortable with abdominal pain/distention and nausea.   Objective:   BP (!) 99/56 (BP Location: Left Arm)   Pulse (!) 108   Temp 97.9 F (36.6 C) (Oral)   Resp 20   Ht 6' (1.829 m)   Wt 91.8 kg   SpO2 91%   BMI 27.45 kg/m   Intake/Output Summary (Last 24 hours) at 10/30/2018 0834 Last data filed at 10/30/2018 0200 Gross per 24 hour  Intake 2055.98 ml  Output 50 ml  Net 2005.98 ml   Weight change: 4.4 kg  Physical Exam: Gen: Uncomfortably resting in bed CVS: Pulse regular tachycardia, S1 and S2 normal Resp: Diminished breath sounds over bases-poor respiratory effort Abd: Moderately distended, exquisitely tender over lower quadrants Ext: No lower extremity edema  Imaging: Mr Abdomen Mrcp Wo Contrast  Result Date: 10/29/2018 CLINICAL DATA:  67 year old male with history of abdominal pain for 1 day. Diffuse pain. Multiple episodes of emesis. Evaluate for potential pancreatitis. EXAM:  MRI ABDOMEN WITHOUT CONTRAST  (INCLUDING MRCP) TECHNIQUE: Multiplanar multisequence MR imaging of the abdomen was performed. Heavily T2-weighted images of the biliary and pancreatic ducts were obtained, and three-dimensional MRCP images were rendered by post processing. COMPARISON:  No prior abdominal MRI. CT the abdomen and pelvis 10/27/2018. FINDINGS: Comment: Study is limited for detection and characterization of visceral and/or vascular lesions by lack of IV gadolinium. Lower chest: Trace bilateral pleural effusions lying dependently with areas of increased signal intensity in the lung bases which could reflect there is of atelectasis or sequela of recent aspiration. Hepatobiliary: Heterogeneous loss of signal intensity throughout the hepatic parenchyma on out of phase dual echo images, indicative of hepatic steatosis. No discrete cystic or solid hepatic lesions are confidently identified on today's noncontrast examination. No intra or extrahepatic biliary ductal dilatation noted on MRCP images. Common bile duct measures 5 mm in the porta hepatis. No filling defects in the common bile duct to suggest choledocholithiasis. No gallstones. Gallbladder is grossly unremarkable in appearance. Pancreas: Diffusely increased T2 signal intensity throughout the pancreas, as well as in the adjacent peripancreatic retroperitoneum. Some of this peripancreatic fluid is also T1 hyperintense, suggesting some proteinaceous/hemorrhagic contents. No discrete pancreatic mass. No pancreatic ductal dilatation noted on MRCP images. Spleen:  Unremarkable. Adrenals/Urinary Tract: Subcentimeter T1 hypointense, T2 hyperintense lesions in the kidneys bilaterally, incompletely characterized on today's noncontrast examination, but statistically likely to represent small cysts. No hydroureteronephrosis in the visualized portions of the abdomen. Bilateral adrenal glands are normal in appearance. Stomach/Bowel: Visualized portions are  unremarkable. Vascular/Lymphatic: No aneurysm identified in the visualized abdominal vasculature. No lymphadenopathy noted in the abdomen. Other: Extensive  edema throughout the retroperitoneum. Small volume of ascites most evident in the pericolic gutters bilaterally. Musculoskeletal: No aggressive appearing osseous lesions are noted in the visualized portions of the skeleton. IMPRESSION: 1. Changes of acute pancreatitis, with extensive pancreatic and peripancreatic inflammatory changes, including proteinaceous and/or hemorrhagic peripancreatic fluid. 2. No choledocholithiasis or findings of biliary tract obstruction. 3. Small volume of ascites. 4. Trace bilateral pleural effusions. 5. Increased signal intensity in the lung bases bilaterally which could reflect areas of passive subsegmental atelectasis, however, the possibility of aspiration pneumonitis should be considered. Correlation with chest radiographs is recommended. Electronically Signed   By: Vinnie Langton M.D.   On: 10/29/2018 10:46   Mr 3d Recon At Scanner  Result Date: 10/29/2018 CLINICAL DATA:  67 year old male with history of abdominal pain for 1 day. Diffuse pain. Multiple episodes of emesis. Evaluate for potential pancreatitis. EXAM: MRI ABDOMEN WITHOUT CONTRAST  (INCLUDING MRCP) TECHNIQUE: Multiplanar multisequence MR imaging of the abdomen was performed. Heavily T2-weighted images of the biliary and pancreatic ducts were obtained, and three-dimensional MRCP images were rendered by post processing. COMPARISON:  No prior abdominal MRI. CT the abdomen and pelvis 10/27/2018. FINDINGS: Comment: Study is limited for detection and characterization of visceral and/or vascular lesions by lack of IV gadolinium. Lower chest: Trace bilateral pleural effusions lying dependently with areas of increased signal intensity in the lung bases which could reflect there is of atelectasis or sequela of recent aspiration. Hepatobiliary: Heterogeneous loss of  signal intensity throughout the hepatic parenchyma on out of phase dual echo images, indicative of hepatic steatosis. No discrete cystic or solid hepatic lesions are confidently identified on today's noncontrast examination. No intra or extrahepatic biliary ductal dilatation noted on MRCP images. Common bile duct measures 5 mm in the porta hepatis. No filling defects in the common bile duct to suggest choledocholithiasis. No gallstones. Gallbladder is grossly unremarkable in appearance. Pancreas: Diffusely increased T2 signal intensity throughout the pancreas, as well as in the adjacent peripancreatic retroperitoneum. Some of this peripancreatic fluid is also T1 hyperintense, suggesting some proteinaceous/hemorrhagic contents. No discrete pancreatic mass. No pancreatic ductal dilatation noted on MRCP images. Spleen:  Unremarkable. Adrenals/Urinary Tract: Subcentimeter T1 hypointense, T2 hyperintense lesions in the kidneys bilaterally, incompletely characterized on today's noncontrast examination, but statistically likely to represent small cysts. No hydroureteronephrosis in the visualized portions of the abdomen. Bilateral adrenal glands are normal in appearance. Stomach/Bowel: Visualized portions are unremarkable. Vascular/Lymphatic: No aneurysm identified in the visualized abdominal vasculature. No lymphadenopathy noted in the abdomen. Other: Extensive edema throughout the retroperitoneum. Small volume of ascites most evident in the pericolic gutters bilaterally. Musculoskeletal: No aggressive appearing osseous lesions are noted in the visualized portions of the skeleton. IMPRESSION: 1. Changes of acute pancreatitis, with extensive pancreatic and peripancreatic inflammatory changes, including proteinaceous and/or hemorrhagic peripancreatic fluid. 2. No choledocholithiasis or findings of biliary tract obstruction. 3. Small volume of ascites. 4. Trace bilateral pleural effusions. 5. Increased signal intensity in the  lung bases bilaterally which could reflect areas of passive subsegmental atelectasis, however, the possibility of aspiration pneumonitis should be considered. Correlation with chest radiographs is recommended. Electronically Signed   By: Vinnie Langton M.D.   On: 10/29/2018 10:46    Labs: BMET Recent Labs  Lab 11/04/2018 1139 10/10/2018 2323 10/29/18 1125 10/30/18 0331  NA 137 140 139 139  K 4.2 4.7 5.2* 5.0  CL 102 109 109 110  CO2 19* 17* 17* 13*  GLUCOSE 312* 215* 211* 244*  BUN 15 27* 44* 77*  CREATININE 1.72* 2.98* 4.91* 6.95*  CALCIUM 8.6* 8.0* 6.8* 5.8*   CBC Recent Labs  Lab 10/15/2018 1139 11/01/2018 2323 10/29/18 1125 10/30/18 0606  WBC 20.9* 29.7* 26.9* 19.8*  HGB 17.8* 17.4* 14.8 12.3*  HCT 55.6* 54.8* 47.5 38.6*  MCV 88.8 89.5 90.8 91.7  PLT 236 183 158 147*    Medications:    . aspirin EC  81 mg Oral Daily  . enoxaparin (LOVENOX) injection  30 mg Subcutaneous Q24H  . metoCLOPramide (REGLAN) injection  5 mg Intravenous Q6H  . sodium bicarbonate  50 mEq Intravenous Once   Elmarie Shiley, MD 10/30/2018, 8:34 AM

## 2018-10-30 NOTE — Progress Notes (Signed)
CRITICAL VALUE ALERT  Critical Value:  Calcium 5.8 mg/dL  Date & Time Notied:  10/30/18 0530hrs  Provider Notified: Silas Sacramento  Orders Received/Actions taken: Awaiting orders.

## 2018-10-30 NOTE — Procedures (Signed)
Arterial Catheter Insertion Procedure Note Derek Riley [facility] [id number] 06-12-1951  Procedure: Insertion of Arterial Catheter  Indications: Blood pressure monitoring and Frequent blood sampling  Procedure Details Consent: Unable to obtain consent because of altered level of consciousness. Time Out: Verified patient identification, verified procedure, site/side was marked, verified correct patient position, special equipment/implants available, medications/allergies/relevent history reviewed, required imaging and test results available.  Performed  Maximum sterile technique was used including antiseptics, cap, gloves, gown, hand hygiene, mask and sheet. Skin prep: Chlorhexidine; local anesthetic administered 20 gauge catheter was inserted into left radial artery using the Seldinger technique. ULTRASOUND GUIDANCE USED: YES Evaluation Blood flow good; BP tracing good. Complications: No apparent complications.  Kennieth Rad, AGACNP-BC [facility] Pgr: [phone] or if no answer [phone] 10/30/2018, 6:06 PM

## 2018-10-30 NOTE — Progress Notes (Signed)
CRITICAL VALUE ALERT  Critical Value:  Calcium 5.2  Date & Time Notied:  10/30/18 1855  Provider Notified: Warren Lacy  Orders Received/Actions taken: none at this time

## 2018-10-30 NOTE — Progress Notes (Signed)
Attempted Arterial Line Placement x3 unable to place.

## 2018-10-30 NOTE — Care Management Important Message (Signed)
Important Message  Patient Details  Name: Derek Riley MRN: [id number] Date of Birth: [birth year]   Medicare Important Message Given:  Yes    Crystin Lechtenberg Montine Circle 10/30/2018, 3:14 PM

## 2018-10-30 NOTE — Consult Note (Addendum)
NAME:  Derek Riley, MRN:  [id number], DOB:  22-Apr-1951, LOS: 2 ADMISSION DATE:  10/27/2018, CONSULTATION DATE:  11/22 REFERRING MD:  11/22, CHIEF COMPLAINT:  Worsening metabolic acidosis and renal failure w/ clinical decline  Brief History   67 year old male admitted 11/20 w/ gallstone pancreatitis. PCCM consulted 11/22 for progressive metabolic acidosis, acute renal failure and persistent clinical decline.   History of present illness   67 year old white male admitted on 11/20 w/ one day h/o acute abd pain. The pain was progressive and associated w/ nausea and vomiting. He described the pain as gnawing subxiphoid that had been progressive in nature. Lipase was 50341, creatinine 1. CT w/ acute pancreatitis. He was admitted to the medical service. GI and surgical service consulted, underwent MRCP that showed extensive pancreatic, peripancreatic inflammation w/ either proteinaceous or hemorrhagic peripancreatic fluid. There was no choledocholithiasis or findings c/w biliary tract obstruction. Followed by surgery w/ plans for eventual cholecystectomy. He was been treated to date w/ IVFs, empiric abx and supportive care. In spite of this he developed worsening renal failure and metabolic acidosis. PCCM asked to see on 11/22 for continued decline in clinical status, acidosis and renal failure    Past Medical History  Diabetes, gallstones, hypertension.  Significant Hospital Events   11/20 transferred to [facility] from [place] w/ acute gallstone pancreatitis. GI & surgicals services consulted. Started On supportive IVFs, empiric zosyn and analgesic support.  Surgical service is planning on cholecystectomy once pancreatitis improved.  Initial lipase on arrival to [place], creatinine 1.72 11/21: MRCP 11/21 showing extensive pancreatitis, possible hemorrhagic peripancreatic fluid no choledocholithiasis, lipase 3361, creatinine up to 4.91, anion gap 13.  No role for ERCP continued supportive  care 11/22: Creatinine up to 6.95, gap now 16, progressive diffuse mottling, worse pain, critical care consulted for shock, and progressive multiorgan failure, moving to intensive care for CRRT as well as hemodynamic support. 3.7 liters + since admit, developed worsening resp failure requiring intubation shortly after arrival. Possible aspiration during intubation   Consults:  11/20 GI and surgical services 11/22 critical care  Procedures:  11/22 Foley catheter 11/22 right IJ CVL (HD)>>> 11/22 OETT 11/22>>>  Significant Diagnostic Tests:  MRCP 11/21: Acute pancreatitis with extensive pancreatic and periPancreatic inflammatory changes, including proteinaceous and/or hemorrhagic peri-pancreatic fluid.  No choledocholithiasis or biliary tract obstruction.  Small volume of ascites, trace bilateral pleural effusions.  Bilateral basilar volume loss  Micro Data:   Antimicrobials:  Zosyn 11/20  Interim history/subjective:  Teah persist, and have worsened over the course of the evening borderline hypotension diffusely mottled and in no acute distress  Objective   Blood pressure (Abnormal) 99/56, pulse (Abnormal) 108, temperature 97.9 F (36.6 C), temperature source Oral, resp. rate 20, height 6' (1.829 m), weight 91.8 kg, SpO2 91 %.        Intake/Output Summary (Last 24 hours) at 10/30/2018 0854 Last data filed at 10/30/2018 0200 Gross per 24 hour  Intake 2055.98 ml  Output 50 ml  Net 2005.98 ml   Filed Weights   10/25/2018 0920 10/30/18 0410  Weight: 87.4 kg 91.8 kg    Examination: General: 67 year old acutely ill male currently tachypneic, reports significant discomfort particularly over abdomen HENT: Sclera are icteric, mucous membranes dry, neck veins are flat Lungs: Clear, tachypneic, decreased bases Cardiovascular: Tachycardic regular rhythm no murmur rub or gallop Abdomen: Distended, painful to palpation, hypoactive bowel sounds Extremities: Cool, pulses palpable, no  edema Neuro: Awake, alert, anxious, otherwise no focal  deficits GU: Concentrated yellow urine  Resolved Hospital Problem list     Assessment & Plan:   Acute gallstone pancreatitis, currently no evidence of choledocholithiasis from MRCP, however imaging does suggest progressive pancreatic and peripancreatic fluid +/- hemorrhage.  Plan Continue n.p.o. Status Trend lipase Treat pain Eventually will need cholecystectomy Checking bladder pressure given the degree of distention and pain  Acute hypoxic respiratory failure. Developed worsening respiratory distress shortly after arrival to ICU.  C/b possible aspiration event  -suspect initially pulm edema but worried about aspiration as well.  Plan Full vent support Stat CXR abg VAP bundle  Cont abx for asp coverage  Distributive shock; meets SIRS criteria from pancreatitis w/ clinical decline raising concern for sepsis/septic shock  Plan Cont IVFs for now (surgical services have written for fluid challenge) Repeat cbc Ck lactic acid Ck blood cultures Transduce CVP Correct acidosis Norepinephrine for MAP > 65 Transfer to ICU Repeat CBC  Progressive anion gap metabolic acidosis-> almost certainly lactic acidosis by clinical exam but could certainly be element of renal failure contributing.  Plan Ck abg Send lactic acid  AKI -baseline Cr 1. Suspect primarily hemodynamically mediated +/- contrast induced nephropathy Plan Transfer to ICU for CRRT Serial chemistries.   Fluid and electrolyte imbalance: hyperkalemia, hypocalcemia Plan CRRT Serial chemistries  Leukocytosis  -improving Plan Trend cbc  Elevated LFTs in setting of colicystitis (in [place] imaging showed gallstones and CBD stones) these have seemingly passed as were neg on MRCP LFTs trending down Plan Intermittent LFTs  DM w/ hyperglycemia Plan ssi protocol. May need gtt  Anemia  Plan Trend cbc  Best practice:  Diet: NPO Pain/Anxiety/Delirium  protocol (if indicated): na VAP protocol (if indicated): na DVT prophylaxis: change from LMWH to [facility] heparin 11/22 GI prophylaxis: PPI (active reflux) Glucose control: ssi Mobility: br Code Status: full code Family Communication: pending Disposition: going to ICU critically ill w/ need for titration of pressors, on-going resuscitation and initiation of CRRT   Labs   CBC: Recent Labs  Lab 10/13/2018 1139 11/01/2018 2323 10/29/18 1125 10/30/18 0606  WBC 20.9* 29.7* 26.9* 19.8*  HGB 17.8* 17.4* 14.8 12.3*  HCT 55.6* 54.8* 47.5 38.6*  MCV 88.8 89.5 90.8 91.7  PLT 236 183 158 147*    Basic Metabolic Panel: Recent Labs  Lab 10/24/2018 1139 11/02/2018 2323 10/29/18 1125 10/30/18 0331  NA 137 140 139 139  K 4.2 4.7 5.2* 5.0  CL 102 109 109 110  CO2 19* 17* 17* 13*  GLUCOSE 312* 215* 211* 244*  BUN 15 27* 44* 77*  CREATININE 1.72* 2.98* 4.91* 6.95*  CALCIUM 8.6* 8.0* 6.8* 5.8*   GFR: Estimated Creatinine Clearance: 11.3 mL/min (A) (by C-G formula based on SCr of 6.95 mg/dL (H)). Recent Labs  Lab 11/03/2018 1139 10/18/2018 2323 10/29/18 1125 10/30/18 0606  WBC 20.9* 29.7* 26.9* 19.8*    Liver Function Tests: Recent Labs  Lab 10/27/2018 1139 10/29/2018 2323 10/29/18 1125 10/30/18 0331  AST 381* 358* 399* 207*  ALT 429* 422* 374* 260*  ALKPHOS 67 55 50 48  BILITOT 3.1* 6.3* 6.3* 4.0*  PROT 7.4 6.7 6.4* 5.9*  ALBUMIN 3.8 3.4* 2.9* 2.3*   Recent Labs  Lab 11/06/2018 1524 10/29/18 1125  LIPASE 3,700* 3,361*   No results for input(s): AMMONIA in the last 168 hours.  ABG No results found for: PHART, PCO2ART, PO2ART, HCO3, TCO2, ACIDBASEDEF, O2SAT   Coagulation Profile: No results for input(s): INR, PROTIME in the last 168 hours.  Cardiac Enzymes: No results  for input(s): CKTOTAL, CKMB, CKMBINDEX, TROPONINI in the last 168 hours.  HbA1C: Hgb A1c MFr Bld  Date/Time Value Ref Range Status  11/07/2018 11:23 PM 8.0 (H) 4.8 - 5.6 % Final    Comment:    (NOTE) Pre  diabetes:          5.7%-6.4% Diabetes:              >6.4% Glycemic control for   <7.0% adults with diabetes     CBG: Recent Labs  Lab 10/29/18 1321 10/29/18 2018 10/30/18 0000 10/30/18 0409 10/30/18 0815  GLUCAP 165* 197* 224* 240* 237*    Review of Systems:    Review of Systems - History obtained from the patient General ROS: positive for  - fever and sleep disturbance negative for - hot flashes, malaise or night sweats Psychological ROS: positive for - anxiety ENT ROS: negative Hematological and Lymphatic ROS: negative Endocrine ROS: positive for - hyperglycemia  Respiratory ROS: no cough, shortness of breath, or wheezing Cardiovascular ROS: no chest pain or dyspnea on exertion Gastrointestinal ROS: positive for - abdominal pain, appetite loss, gas/bloating, heartburn and nausea/vomiting Genito-Urinary ROS: no dysuria, trouble voiding, or hematuria Musculoskeletal ROS: negative Neurological ROS: no TIA or stroke symptoms Past Medical History  He,  has a past medical history of Diabetes mellitus without complication ([place]), Gallstone (10/2018), Hyperlipidemia, and Hypertension.   Surgical History    Past Surgical History:  Procedure Laterality Date  . CIRCUMCISION    . excision of moles     face & leg  . TONSILLECTOMY       Social History   reports that he has never smoked. He has never used smokeless tobacco. He reports that he does not drink alcohol or use drugs.   Family History   His family history is negative for Pancreatitis.   Allergies No Known Allergies   Home Medications  Prior to Admission medications   Medication Sig Start Date End Date Taking? Authorizing Provider  aspirin EC 81 MG tablet Take 81 mg by mouth daily.   Yes [provider]  atorvastatin (LIPITOR) 80 MG tablet Take 80 mg by mouth every evening. 10/19/18  Yes [provider]  dicyclomine (BENTYL) 10 MG capsule Take 10 mg by mouth 3 (three) times daily as needed.  unk 10/27/18  Yes [provider]  ezetimibe (ZETIA) 10 MG tablet Take 10 mg by mouth every evening. 08/13/18  Yes [provider]  finasteride (PROSCAR) 5 MG tablet Take 5 mg by mouth daily. 10/23/18  Yes [provider]  GLUCOSAMINE SULFATE PO Take 1 capsule by mouth 2 (two) times daily.   Yes [provider]  HYDROcodone-acetaminophen (NORCO/VICODIN) 5-325 MG tablet Take 1 tablet by mouth every 6 (six) hours as needed for pain. 09/15/18  Yes [provider]  lisinopril (PRINIVIL,ZESTRIL) 10 MG tablet Take 10 mg by mouth daily. 10/12/18  Yes [provider]  metFORMIN (GLUCOPHAGE) 1000 MG tablet Take 1,000 mg by mouth 2 (two) times daily. 10/15/18  Yes [provider]  Omega-3 1000 MG CAPS Take 1,000 mg by mouth 2 (two) times daily.   Yes [provider]  promethazine (PHENERGAN) 25 MG tablet Take 25 mg by mouth every 6 (six) hours as needed for nausea/vomiting. 10/27/18  Yes [provider]     Critical care time: 66min      Peter E Babcock ACNP-BC [place] Pager # [phone] OR # [phone] if no answer

## 2018-10-30 NOTE — Progress Notes (Signed)
Second page sent to [facility] for critical lab value - Calcium 5.8 mg/dL. Awaiting orders.

## 2018-10-30 NOTE — Progress Notes (Signed)
Inpatient Diabetes Program Recommendations  AACE/ADA: New Consensus Statement on Inpatient Glycemic Control (2019)  Target Ranges:  Prepandial:   less than 140 mg/dL      Peak postprandial:   less than 180 mg/dL (1-2 hours)      Critically ill patients:  140 - 180 mg/dL  Results for [name], [name] (MRN [id number]) as of 10/30/2018 11:38  Ref. Range 10/29/2018 13:21 10/29/2018 20:18 10/30/2018 00:00 10/30/2018 04:09 10/30/2018 08:15  Glucose-Capillary Latest Ref Range: 70 - 99 mg/dL 165 (H) 197 (H) 224 (H) 240 (H) 237 (H)   Results for [name], [name] (MRN [id number]) as of 10/29/2018 11:17  Ref. Range 10/29/2018 00:13 10/29/2018 03:55 10/29/2018 08:47  Glucose-Capillary Latest Ref Range: 70 - 99 mg/dL 207 (H) 178 (H) 209 (H)   Results for [name], [name] (MRN [id number]) as of 10/29/2018 11:17  Ref. Range 11/04/2018 23:23  Hemoglobin A1C Latest Ref Range: 4.8 - 5.6 % 8.0 (H)   Review of Glycemic Control  Diabetes history: DM2 Outpatient Diabetes medications: Metformin 1000 mg BID Current orders for Inpatient glycemic control: CBGs Q4H  Inpatient Diabetes Program Recommendations:  Correction (SSI): Please consider ordering Novolog 0-9 units Q4H. HgbA1C: A1C 8% on 10/13/2018 indicating an average glucose of 183 mg/dl over the past 2-3 months.  Thanks, Barnie Alderman, RN, MSN, CDE Diabetes Coordinator Inpatient Diabetes Program [phone] (Team Pager from 8am to 5pm)

## 2018-10-30 NOTE — Progress Notes (Addendum)
Subjective: AWAKE. COOPERATIVE. Moderately severe distress from pain BP 99/56. HR 108. Oliguric Hgb 12.3. WBC 19.8. Creat up 6.95.  Glucose 244.  Calcium 5.8.  AST 207.  ALT 260.  Total bilirubin 4.0 CO2 13   Objective: Vital signs in last 24 hours: Temp:  [97 F (36.1 C)-98.3 F (36.8 C)] 97.9 F (36.6 C) (11/22 0747) Pulse Rate:  [105-120] 108 (11/22 0747) Resp:  [18-36] 20 (11/22 0747) BP: (90-125)/(50-99) 99/56 (11/22 0747) SpO2:  [91 %-95 %] 91 % (11/22 0747) Weight:  [91.8 kg] 91.8 kg (11/22 0410) Last BM Date: 10/27/18  Intake/Output from previous day: 11/21 0701 - 11/22 0700 In: 2056 [I.V.:1857.6; IV Piggyback:198.4] Out: 200 [Urine:150; Emesis/NG output:50] Intake/Output this shift: No intake/output data recorded.  EXAM:  General: Awake.  Moderately severe distress.  Coherent mental status normal. Lungs: Without rales or rhonchi but decreased breath sounds bases Abdomen distended and diffusely tender.  Silent no mass  Lab Results:  Recent Labs    10/29/18 1125 10/30/18 0606  WBC 26.9* 19.8*  HGB 14.8 12.3*  HCT 47.5 38.6*  PLT 158 147*   BMET Recent Labs    10/29/18 1125 10/30/18 0331  NA 139 139  K 5.2* 5.0  CL 109 110  CO2 17* 13*  GLUCOSE 211* 244*  BUN 44* 77*  CREATININE 4.91* 6.95*  CALCIUM 6.8* 5.8*   PT/INR No results for input(s): LABPROT, INR in the last 72 hours. ABG No results for input(s): PHART, HCO3 in the last 72 hours.  Invalid input(s): PCO2, PO2  Studies/Results: Mr Abdomen Mrcp Wo Contrast  Result Date: 10/29/2018 CLINICAL DATA:  67 year old male with history of abdominal pain for 1 day. Diffuse pain. Multiple episodes of emesis. Evaluate for potential pancreatitis. EXAM: MRI ABDOMEN WITHOUT CONTRAST  (INCLUDING MRCP) TECHNIQUE: Multiplanar multisequence MR imaging of the abdomen was performed. Heavily T2-weighted images of the biliary and pancreatic ducts were obtained, and three-dimensional MRCP images were  rendered by post processing. COMPARISON:  No prior abdominal MRI. CT the abdomen and pelvis 10/27/2018. FINDINGS: Comment: Study is limited for detection and characterization of visceral and/or vascular lesions by lack of IV gadolinium. Lower chest: Trace bilateral pleural effusions lying dependently with areas of increased signal intensity in the lung bases which could reflect there is of atelectasis or sequela of recent aspiration. Hepatobiliary: Heterogeneous loss of signal intensity throughout the hepatic parenchyma on out of phase dual echo images, indicative of hepatic steatosis. No discrete cystic or solid hepatic lesions are confidently identified on today's noncontrast examination. No intra or extrahepatic biliary ductal dilatation noted on MRCP images. Common bile duct measures 5 mm in the porta hepatis. No filling defects in the common bile duct to suggest choledocholithiasis. No gallstones. Gallbladder is grossly unremarkable in appearance. Pancreas: Diffusely increased T2 signal intensity throughout the pancreas, as well as in the adjacent peripancreatic retroperitoneum. Some of this peripancreatic fluid is also T1 hyperintense, suggesting some proteinaceous/hemorrhagic contents. No discrete pancreatic mass. No pancreatic ductal dilatation noted on MRCP images. Spleen:  Unremarkable. Adrenals/Urinary Tract: Subcentimeter T1 hypointense, T2 hyperintense lesions in the kidneys bilaterally, incompletely characterized on today's noncontrast examination, but statistically likely to represent small cysts. No hydroureteronephrosis in the visualized portions of the abdomen. Bilateral adrenal glands are normal in appearance. Stomach/Bowel: Visualized portions are unremarkable. Vascular/Lymphatic: No aneurysm identified in the visualized abdominal vasculature. No lymphadenopathy noted in the abdomen. Other: Extensive edema throughout the retroperitoneum. Small volume of ascites most evident in the pericolic  gutters bilaterally. Musculoskeletal: No  aggressive appearing osseous lesions are noted in the visualized portions of the skeleton. IMPRESSION: 1. Changes of acute pancreatitis, with extensive pancreatic and peripancreatic inflammatory changes, including proteinaceous and/or hemorrhagic peripancreatic fluid. 2. No choledocholithiasis or findings of biliary tract obstruction. 3. Small volume of ascites. 4. Trace bilateral pleural effusions. 5. Increased signal intensity in the lung bases bilaterally which could reflect areas of passive subsegmental atelectasis, however, the possibility of aspiration pneumonitis should be considered. Correlation with chest radiographs is recommended. Electronically Signed   By: Vinnie Langton M.D.   On: 10/29/2018 10:46   Mr 3d Recon At Scanner  Result Date: 10/29/2018 CLINICAL DATA:  67 year old male with history of abdominal pain for 1 day. Diffuse pain. Multiple episodes of emesis. Evaluate for potential pancreatitis. EXAM: MRI ABDOMEN WITHOUT CONTRAST  (INCLUDING MRCP) TECHNIQUE: Multiplanar multisequence MR imaging of the abdomen was performed. Heavily T2-weighted images of the biliary and pancreatic ducts were obtained, and three-dimensional MRCP images were rendered by post processing. COMPARISON:  No prior abdominal MRI. CT the abdomen and pelvis 10/27/2018. FINDINGS: Comment: Study is limited for detection and characterization of visceral and/or vascular lesions by lack of IV gadolinium. Lower chest: Trace bilateral pleural effusions lying dependently with areas of increased signal intensity in the lung bases which could reflect there is of atelectasis or sequela of recent aspiration. Hepatobiliary: Heterogeneous loss of signal intensity throughout the hepatic parenchyma on out of phase dual echo images, indicative of hepatic steatosis. No discrete cystic or solid hepatic lesions are confidently identified on today's noncontrast examination. No intra or extrahepatic  biliary ductal dilatation noted on MRCP images. Common bile duct measures 5 mm in the porta hepatis. No filling defects in the common bile duct to suggest choledocholithiasis. No gallstones. Gallbladder is grossly unremarkable in appearance. Pancreas: Diffusely increased T2 signal intensity throughout the pancreas, as well as in the adjacent peripancreatic retroperitoneum. Some of this peripancreatic fluid is also T1 hyperintense, suggesting some proteinaceous/hemorrhagic contents. No discrete pancreatic mass. No pancreatic ductal dilatation noted on MRCP images. Spleen:  Unremarkable. Adrenals/Urinary Tract: Subcentimeter T1 hypointense, T2 hyperintense lesions in the kidneys bilaterally, incompletely characterized on today's noncontrast examination, but statistically likely to represent small cysts. No hydroureteronephrosis in the visualized portions of the abdomen. Bilateral adrenal glands are normal in appearance. Stomach/Bowel: Visualized portions are unremarkable. Vascular/Lymphatic: No aneurysm identified in the visualized abdominal vasculature. No lymphadenopathy noted in the abdomen. Other: Extensive edema throughout the retroperitoneum. Small volume of ascites most evident in the pericolic gutters bilaterally. Musculoskeletal: No aggressive appearing osseous lesions are noted in the visualized portions of the skeleton. IMPRESSION: 1. Changes of acute pancreatitis, with extensive pancreatic and peripancreatic inflammatory changes, including proteinaceous and/or hemorrhagic peripancreatic fluid. 2. No choledocholithiasis or findings of biliary tract obstruction. 3. Small volume of ascites. 4. Trace bilateral pleural effusions. 5. Increased signal intensity in the lung bases bilaterally which could reflect areas of passive subsegmental atelectasis, however, the possibility of aspiration pneumonitis should be considered. Correlation with chest radiographs is recommended. Electronically Signed   By: Vinnie Langton M.D.   On: 10/29/2018 10:46    Anti-infectives: Anti-infectives (From admission, onward)   Start     Dose/Rate Route Frequency Ordered Stop   10/29/18 1900  piperacillin-tazobactam (ZOSYN) IVPB 2.25 g     2.25 g 100 mL/hr over 30 Minutes Intravenous Every 6 hours 10/29/18 1741     11/04/2018 2000  piperacillin-tazobactam (ZOSYN) IVPB 3.375 g  Status:  Discontinued     3.375 g 12.5 mL/hr  over 240 Minutes Intravenous Every 8 hours 10/21/2018 1156 10/29/18 1741   10/09/2018 1230  piperacillin-tazobactam (ZOSYN) IVPB 3.375 g     3.375 g 100 mL/hr over 30 Minutes Intravenous  Once 10/26/2018 1156 10/17/2018 1506      Assessment/Plan:  Severe pancreatitis. -Oliguria, acidosis, leukocytosis, elevated creatinine, hypocalcemia all indicate severe metabolic derangement and under resuscitation -I have taken the liberty of ordering a 3 L bolus of normal saline -No apparent role for surgical intervention at this time -The focus for his care at this time should be resuscitation and correction of metabolic derangements -I advised transfer to ICU and critical care consult. This was discussed with Dr. Karleen Hampshire who agrees  Cholelithiasis, Dr Paulita Fujita does not feel ERCP is indicated at this time.  Follow LFTs.  Ultimately he would benefit from cholecystectomy but this may be many days in the future if not longer  Diabetes  hypertension     LOS: 2 days    Adin Hector 10/30/2018

## 2018-10-30 NOTE — Progress Notes (Signed)
Patient seen and examined, going to ICU, as pt is critically ill and will need pressors, resuscitation and possible initiation of CRRT.  TRH will sign off . Appreciate critical care support.    Hosie Poisson MD [phone]

## 2018-10-30 NOTE — Progress Notes (Signed)
Per order F/C placed. 74F Urine sample obtained for possible testing. Pt tolerated well.

## 2018-10-30 NOTE — Procedures (Signed)
Intubation Procedure Note [name] [id number] [birth year]  Procedure: Intubation Indications: Respiratory insufficiency  Procedure Details Consent: Risks of procedure as well as the alternatives and risks of each were explained to the (patient/caregiver).  Consent for procedure obtained. Time Out: Verified patient identification, verified procedure, site/side was marked, verified correct patient position, special equipment/implants available, medications/allergies/relevent history reviewed, required imaging and test results available.  Performed  Maximum sterile technique was used including antiseptics, cap, gloves, hand hygiene and mask.  MAC and 4    Evaluation Hemodynamic Status: Transient hypotension treated with pressors; O2 sats: transiently fell during during procedure Patient's Current Condition: stable Complications: No apparent complications Patient did tolerate procedure well. Chest X-ray ordered to verify placement.  CXR: pending.   Derek Riley 10/30/2018

## 2018-10-30 NOTE — Procedures (Signed)
Intubation Procedure Note Derek Riley [id number] [birth year]  Procedure: Intubation Indications: Airway protection and maintenance  Procedure Details Consent: Risks of procedure as well as the alternatives and risks of each were explained to the (patient/caregiver).  Consent for procedure obtained. Time Out: Verified patient identification, verified procedure, site/side was marked, verified correct patient position, special equipment/implants available, medications/allergies/relevent history reviewed, required imaging and test results available.  Performed  Maximum sterile technique was used including cap, gloves, gown, hand hygiene, mask and sheet.  4    Evaluation Hemodynamic Status: BP stable throughout; O2 sats: stable throughout Patient's Current Condition: stable Complications: No apparent complications Patient did tolerate procedure well. Chest X-ray ordered to verify placement.  CXR: pending.   Gonzella Lex 10/30/2018

## 2018-10-30 NOTE — Progress Notes (Signed)
Pt transferred to medical ICU (2M10).  Report given to Charge nurse, Caryl Pina, RN.  Pt was transported by Rapid Response nurse and Sharyn Lull, RN to ICU.  Notified CCMD of transfer.

## 2018-10-31 ENCOUNTER — Inpatient Hospital Stay (HOSPITAL_COMMUNITY): Payer: Medicare Other

## 2018-10-31 LAB — CBC
HCT: 33.8 % — ABNORMAL LOW (ref 39.0–52.0)
Hemoglobin: 11.3 g/dL — ABNORMAL LOW (ref 13.0–17.0)
MCH: 29.2 pg (ref 26.0–34.0)
MCHC: 33.4 g/dL (ref 30.0–36.0)
MCV: 87.3 fL (ref 80.0–100.0)
PLATELETS: 142 10*3/uL — AB (ref 150–400)
RBC: 3.87 MIL/uL — AB (ref 4.22–5.81)
RDW: 13.8 % (ref 11.5–15.5)
WBC: 26.8 10*3/uL — ABNORMAL HIGH (ref 4.0–10.5)
nRBC: 0.1 % (ref 0.0–0.2)

## 2018-10-31 LAB — COMPREHENSIVE METABOLIC PANEL
ALT: 147 U/L — AB (ref 0–44)
ANION GAP: 16 — AB (ref 5–15)
AST: 84 U/L — ABNORMAL HIGH (ref 15–41)
Albumin: 2 g/dL — ABNORMAL LOW (ref 3.5–5.0)
Alkaline Phosphatase: 72 U/L (ref 38–126)
BUN: 59 mg/dL — ABNORMAL HIGH (ref 8–23)
CALCIUM: 5.5 mg/dL — AB (ref 8.9–10.3)
CHLORIDE: 90 mmol/L — AB (ref 98–111)
CO2: 33 mmol/L — ABNORMAL HIGH (ref 22–32)
CREATININE: 5.22 mg/dL — AB (ref 0.61–1.24)
GFR, EST AFRICAN AMERICAN: 12 mL/min — AB (ref >60)
GFR, EST NON AFRICAN AMERICAN: 10 mL/min — AB (ref >60)
Glucose, Bld: 136 mg/dL — ABNORMAL HIGH (ref 70–99)
Potassium: 3.5 mmol/L (ref 3.5–5.1)
Sodium: 139 mmol/L (ref 135–145)
Total Bilirubin: 2.3 mg/dL — ABNORMAL HIGH (ref 0.3–1.2)
Total Protein: 5.7 g/dL — ABNORMAL LOW (ref 6.5–8.1)

## 2018-10-31 LAB — RENAL FUNCTION PANEL
ALBUMIN: 1.7 g/dL — AB (ref 3.5–5.0)
Anion gap: 12 (ref 5–15)
BUN: 45 mg/dL — AB (ref 8–23)
CALCIUM: 5.8 mg/dL — AB (ref 8.9–10.3)
CO2: 29 mmol/L (ref 22–32)
Chloride: 95 mmol/L — ABNORMAL LOW (ref 98–111)
Creatinine, Ser: 3.92 mg/dL — ABNORMAL HIGH (ref 0.61–1.24)
GFR calc Af Amer: 17 mL/min — ABNORMAL LOW (ref >60)
GFR, EST NON AFRICAN AMERICAN: 15 mL/min — AB (ref >60)
Glucose, Bld: 162 mg/dL — ABNORMAL HIGH (ref 70–99)
PHOSPHORUS: 4.1 mg/dL (ref 2.5–4.6)
POTASSIUM: 4 mmol/L (ref 3.5–5.1)
Sodium: 136 mmol/L (ref 135–145)

## 2018-10-31 LAB — GLUCOSE, CAPILLARY
GLUCOSE-CAPILLARY: 124 mg/dL — AB (ref 70–99)
GLUCOSE-CAPILLARY: 144 mg/dL — AB (ref 70–99)
Glucose-Capillary: 107 mg/dL — ABNORMAL HIGH (ref 70–99)
Glucose-Capillary: 114 mg/dL — ABNORMAL HIGH (ref 70–99)
Glucose-Capillary: 167 mg/dL — ABNORMAL HIGH (ref 70–99)
Glucose-Capillary: 126 mg/dL — ABNORMAL HIGH (ref 70–99)

## 2018-10-31 LAB — POCT I-STAT 3, ART BLOOD GAS (G3+)
ACID-BASE EXCESS: 12 mmol/L — AB (ref 0.0–2.0)
Bicarbonate: 37.2 mmol/L — ABNORMAL HIGH (ref 20.0–28.0)
O2 Saturation: 95 %
PH ART: 7.433 (ref 7.350–7.450)
TCO2: 39 mmol/L — ABNORMAL HIGH (ref 22–32)
pCO2 arterial: 56.4 mmHg — ABNORMAL HIGH (ref 32.0–48.0)
pO2, Arterial: 82 mmHg — ABNORMAL LOW (ref 83.0–108.0)

## 2018-10-31 LAB — CORTISOL: Cortisol, Plasma: 32.4 ug/dL

## 2018-10-31 LAB — LIPASE, BLOOD: Lipase: 304 U/L — ABNORMAL HIGH (ref 11–51)

## 2018-10-31 LAB — MAGNESIUM: MAGNESIUM: 1.6 mg/dL — AB (ref 1.7–2.4)

## 2018-10-31 LAB — PHOSPHORUS: PHOSPHORUS: 2.8 mg/dL (ref 2.5–4.6)

## 2018-10-31 LAB — LACTIC ACID, PLASMA: LACTIC ACID, VENOUS: 2.4 mmol/L — AB (ref 0.5–1.9)

## 2018-10-31 MED ORDER — ALBUMIN HUMAN 5 % IV SOLN
12.5000 g | Freq: Once | INTRAVENOUS | Status: AC
  Administered 2018-11-01: 12.5 g via INTRAVENOUS
  Filled 2018-10-31: qty 250

## 2018-10-31 MED ORDER — SODIUM CHLORIDE 0.9 % IV BOLUS: 1000.0000 mL | Freq: Once | INTRAVENOUS | Status: DC

## 2018-10-31 MED ORDER — SODIUM CHLORIDE 0.9 % IV SOLN
0.0000 ug/min | INTRAVENOUS | Status: DC
  Filled 2018-10-31: qty 4

## 2018-10-31 MED ORDER — PRISMASOL BGK 4/2.5 32-4-2.5 MEQ/L REPLACEMENT SOLN
Status: DC
  Administered 2018-10-31 – 2018-11-04 (×12): via INTRAVENOUS_CENTRAL
  Filled 2018-10-31 (×14): qty 5000

## 2018-10-31 MED ORDER — PRISMASOL BGK 4/2.5 32-4-2.5 MEQ/L REPLACEMENT SOLN
Status: DC
  Administered 2018-10-31 – 2018-11-04 (×8): via INTRAVENOUS_CENTRAL
  Filled 2018-10-31 (×7): qty 5000

## 2018-10-31 MED ORDER — LACTATED RINGERS IV BOLUS
1000.0000 mL | Freq: Once | INTRAVENOUS | Status: AC
  Administered 2018-10-31: 1000 mL via INTRAVENOUS

## 2018-10-31 MED ORDER — ACETAMINOPHEN 650 MG RE SUPP
650.0000 mg | Freq: Four times a day (QID) | RECTAL | Status: DC | PRN
  Administered 2018-10-31 – 2018-11-09 (×4): 650 mg via RECTAL
  Filled 2018-10-31 (×4): qty 1

## 2018-10-31 MED ORDER — HEPARIN SODIUM (PORCINE) 1000 UNIT/ML DIALYSIS
1000.0000 [IU] | INTRAMUSCULAR | Status: DC | PRN
  Administered 2018-11-04: 2400 [IU] via INTRAVENOUS_CENTRAL
  Filled 2018-10-31 (×4): qty 6

## 2018-10-31 MED ORDER — CALCIUM GLUCONATE-NACL 2-0.675 GM/100ML-% IV SOLN
2.0000 g | Freq: Once | INTRAVENOUS | Status: AC
  Administered 2018-10-31: 2000 mg via INTRAVENOUS
  Filled 2018-10-31: qty 100

## 2018-10-31 MED ORDER — MAGNESIUM SULFATE 4 GM/100ML IV SOLN
4.0000 g | Freq: Once | INTRAVENOUS | Status: AC
  Administered 2018-10-31: 4 g via INTRAVENOUS
  Filled 2018-10-31: qty 100

## 2018-10-31 NOTE — Consult Note (Addendum)
NAME:  Derek Riley, MRN:  [id number], DOB:  May 15, 1951, LOS: 3 ADMISSION DATE:  10/09/2018, CONSULTATION DATE:  11/22 REFERRING MD:  11/22, CHIEF COMPLAINT:  Worsening metabolic acidosis and renal failure w/ clinical decline  Brief History   67 year old male admitted 11/20 w/ gallstone pancreatitis. PCCM consulted 11/22 for progressive metabolic acidosis, acute renal failure and persistent clinical decline.   History of present illness   67 year old white male admitted on 11/20 w/ one day h/o acute abd pain. The pain was progressive and associated w/ nausea and vomiting. He described the pain as gnawing subxiphoid that had been progressive in nature. Lipase was 50341, creatinine 1. CT w/ acute pancreatitis. He was admitted to the medical service. GI and surgical service consulted, underwent MRCP that showed extensive pancreatic, peripancreatic inflammation w/ either proteinaceous or hemorrhagic peripancreatic fluid. There was no choledocholithiasis or findings c/w biliary tract obstruction. Followed by surgery w/ plans for eventual cholecystectomy. He was been treated to date w/ IVFs, empiric abx and supportive care. In spite of this he developed worsening renal failure and metabolic acidosis. PCCM asked to see on 11/22 for continued decline in clinical status, acidosis and renal failure    Past Medical History  Diabetes, gallstones, hypertension.  Significant Hospital Events   11/20 transferred to [facility] from [place] w/ acute gallstone pancreatitis. GI & surgicals services consulted. Started On supportive IVFs, empiric zosyn and analgesic support.  Surgical service is planning on cholecystectomy once pancreatitis improved.  Initial lipase on arrival to [place], creatinine 1.72 11/21: MRCP 11/21 showing extensive pancreatitis, possible hemorrhagic peripancreatic fluid no choledocholithiasis, lipase 3361, creatinine up to 4.91, anion gap 13.  No role for ERCP continued supportive  care 11/22: Creatinine up to 6.95, gap now 16, progressive diffuse mottling, worse pain, critical care consulted for shock, and progressive multiorgan failure, moving to intensive care for CRRT as well as hemodynamic support. 3.7 liters + since admit, developed worsening resp failure requiring intubation shortly after arrival. Possible aspiration during intubation   Consults:  11/20 GI and surgical services 11/22 critical care  Procedures:  11/22 Foley catheter 11/22 right IJ CVL (HD)>>> 11/22 OETT 11/22>>>  Significant Diagnostic Tests:  MRCP 11/21: Acute pancreatitis with extensive pancreatic and periPancreatic inflammatory changes, including proteinaceous and/or hemorrhagic peri-pancreatic fluid.  No choledocholithiasis or biliary tract obstruction.  Small volume of ascites, trace bilateral pleural effusions.  Bilateral basilar volume loss  Micro Data:   Antimicrobials:  Zosyn 11/20  Interim history/subjective:  Acutely decompensated 11/22 , requiring pressor support and intubation  Suspected aspiration with recent vomiting . -CXR w/ increased RLL aspdz  On CRRT . -even bal .  Lactate elevated.  Remains on vent support, fio2 weaned to .50 this am . -not weaning  Fever -tmax 101 , Green/brown mucus  On Pressors , b/p labile  On precedex /fent , follows commands, calm   Objective   Blood pressure 115/67, pulse 74, temperature 99.2 F (37.3 C), temperature source Axillary, resp. rate 16, height 6' (1.829 m), weight 89.7 kg, SpO2 97 %. CVP:  [11 mmHg-12 mmHg] 12 mmHg  Vent Mode: PRVC FiO2 (%):  [50 %-100 %] 50 % Set Rate:  [16 bmp] 16 bmp Vt Set:  [600 mL] 600 mL PEEP:  [5 cmH20] 5 cmH20 Plateau Pressure:  [16 cmH20-19 cmH20] 19 cmH20   Intake/Output Summary (Last 24 hours) at 10/31/2018 0904 Last data filed at 10/31/2018 0800 Gross per 24 hour  Intake 4840.11 ml  Output 2654 ml  Net 2186.11 ml   Filed Weights   10/26/2018 0920 10/30/18 0410 10/31/18 0411  Weight: 87.4  kg 91.8 kg 89.7 kg    Examination: General: Elderly male appears calm on ventilator  HENT: Dry mucosa, ETT in place  Lungs: Diminished breath sounds in the bases, on vent  Cardiovascular: RRR no MRG  Abdomen: Distended, hypoactive bowel sounds, OGT  Extremities: Warm, no edema pulses palpable  Neuro: Sedated, calm, follows commands  GU: Foley in place  Resolved Hospital Problem list     Assessment & Plan:   Acute gallstone pancreatitis, currently no evidence of choledocholithiasis from MRCP, however imaging does suggest progressive pancreatic and peripancreatic fluid +/- hemorrhage.  Plan Continue n.p.o. Status Trend lipase Treat pain Eventually will need cholecystectomy   Acute hypoxic respiratory failure. Developed worsening respiratory distress shortly after arrival to ICU.  C/b possible aspiration event  -CXR c/w RLL aspiration  Plan Full vent support Tr  CXR  VAP bundle  Cont abx for asp coverage  Distributive shock; meets SIRS criteria from pancreatitis w/ clinical decline raising concern for sepsis/septic shock  Plan 1 L IVF now .  tr cbc Tr  lactic acid Check  blood cultures and sputum cx   CVP-unable to obtain due lack of access .   Norepinephrine for MAP > 65 Check cortisol level      Progressive anion gap metabolic acidosis-> almost certainly lactic acidosis by clinical exam but could certainly be element of renal failure contributing.  Plan Tr lactate and bmet   AKI -baseline Cr 1. Suspect primarily hemodynamically mediated +/- contrast induced nephropathy Plan Cont CRRT  Serial chemistries.   Fluid and electrolyte imbalance: hyperkalemia, hypocalcemia, hypomagnesium  Plan Replace as indicated.  Serial chemistries  Leukocytosis  -improving Plan Trend cbc  Elevated LFTs in setting of colicystitis (in [place] imaging showed gallstones and CBD stones) these have seemingly passed as were neg on MRCP LFTs trending down, lipase tr down   Plan Intermittent LFTs and lipase   DM w/ hyperglycemia Plan ssi protocol.    Anemia  Plan Trend cbc  Best practice:  Diet: NPO Pain/Anxiety/Delirium protocol (if indicated): fent /precedex  VAP protocol (if indicated):  DVT prophylaxis: change from LMWH to [facility] heparin 11/22 GI prophylaxis: PPI (active reflux) Glucose control: ssi Mobility: br Code Status: full code Family Communication: pending Disposition: ICU  Labs   CBC: Recent Labs  Lab 10/21/2018 2323 10/29/18 1125 10/30/18 0606 10/30/18 1817 10/31/18 0402  WBC 29.7* 26.9* 19.8* 29.1* 26.8*  HGB 17.4* 14.8 12.3* 11.5* 11.3*  HCT 54.8* 47.5 38.6* 34.8* 33.8*  MCV 89.5 90.8 91.7 89.2 87.3  PLT 183 158 147* 149* 142*    Basic Metabolic Panel: Recent Labs  Lab 10/27/2018 2323 10/29/18 1125 10/30/18 0331 10/30/18 1814 10/31/18 0402  NA 140 139 139 140 139  K 4.7 5.2* 5.0 3.9 3.5  CL 109 109 110 100 90*  CO2 17* 17* 13* 25 33*  GLUCOSE 215* 211* 244* 162* 136*  BUN 27* 44* 77* 77* 59*  CREATININE 2.98* 4.91* 6.95* 6.02* 5.22*  CALCIUM 8.0* 6.8* 5.8* 5.2* 5.5*  MG  --   --   --   --  1.6*  PHOS  --   --   --  3.5 2.8   GFR: Estimated Creatinine Clearance: 15.1 mL/min (A) (by C-G formula based on SCr of 5.22 mg/dL (H)). Recent Labs  Lab 10/29/18 1125 10/30/18 0606 10/30/18 1542 10/30/18 1817 10/31/18 0402  WBC 26.9* 19.8*  --  29.1* 26.8*  LATICACIDVEN  --   --  1.8  --  2.4*    Liver Function Tests: Recent Labs  Lab 10/22/2018 1139 10/21/2018 2323 10/29/18 1125 10/30/18 0331 10/30/18 1814 10/31/18 0402  AST 381* 358* 399* 207*  --  84*  ALT 429* 422* 374* 260*  --  147*  ALKPHOS 67 55 50 48  --  72  BILITOT 3.1* 6.3* 6.3* 4.0*  --  2.3*  PROT 7.4 6.7 6.4* 5.9*  --  5.7*  ALBUMIN 3.8 3.4* 2.9* 2.3* 2.1* 2.0*   Recent Labs  Lab 10/26/2018 1524 10/29/18 1125 10/31/18 0402  LIPASE 3,700* 3,361* 304*   No results for input(s): AMMONIA in the last 168 hours.  ABG    Component Value  Date/Time   PHART 7.433 10/31/2018 0228   PCO2ART 56.4 (H) 10/31/2018 0228   PO2ART 82.0 (L) 10/31/2018 0228   HCO3 37.2 (H) 10/31/2018 0228   TCO2 39 (H) 10/31/2018 0228   ACIDBASEDEF 8.0 (H) 10/30/2018 1356   O2SAT 95.0 10/31/2018 0228     Coagulation Profile: No results for input(s): INR, PROTIME in the last 168 hours.  Cardiac Enzymes: No results for input(s): CKTOTAL, CKMB, CKMBINDEX, TROPONINI in the last 168 hours.  HbA1C: Hgb A1c MFr Bld  Date/Time Value Ref Range Status  11/06/2018 11:23 PM 8.0 (H) 4.8 - 5.6 % Final    Comment:    (NOTE) Pre diabetes:          5.7%-6.4% Diabetes:              >6.4% Glycemic control for   <7.0% adults with diabetes     CBG: Recent Labs  Lab 10/30/18 1951 10/30/18 2343 10/31/18 0402 10/31/18 0728 10/31/18 0732  GLUCAP 145* 128* 124* 107* 62*     Past Medical History  He,  has a past medical history of Diabetes mellitus without complication ([place]), Gallstone (10/2018), Hyperlipidemia, and Hypertension.    Critical care time:       Tammy Parrett NP-C  [facility]  [phone]   10/31/2018

## 2018-10-31 NOTE — Progress Notes (Signed)
Initial Nutrition Assessment  DOCUMENTATION CODES:  Not applicable  INTERVENTION:  If aggressive care pursued, would recommend initiation of EN via small bore ngt placed at LOT  TF recs would be as follows: Vital AF 1.2 @55  cc/hr (1320 ml per day) and Prostat 60 ml BID to provide 1984 kcals, 159 gm protein, 1071 ml free water daily.  If EN felt to be contraindicated d/t other complex medical issues, would recommend TPN.   NUTRITION DIAGNOSIS:  Inadequate oral intake related to inability to eat as evidenced by NPO status.  GOAL:  Patient will meet greater than or equal to 90% of their needs   MONITOR:  Diet advancement, Vent status, Labs, Weight trends, I & O's, TF tolerance(GOC)  REASON FOR ASSESSMENT:  Ventilator    ASSESSMENT:  67 y/o male PMHx htn/hld, dm2. Presented w/ abd pain that began suddenly 1 day PTA. Supportive care from PCP ineffective and presented to ED w/ vomiting. Determined to have severe gallstone pancreatitis w/ related AKI. Decompensated after arrival, developing  worsening renal failure. On 11/22 transfered to ICU and required intubation. Begun on pressors and CRRT.    Pt intubated, sedated on RD arrival. No one present to provide any history. There is no information in chart predating this admission.   H&P notes pts abdominal pain only began 1 day PTA.; it was not something that had been worsening over time. Based on this information, would suspect he was eating/drinking as normal up to 11/19 when pain began. Will confirm when able to speak with family. Physical exam supports this notion as he little to no muscle/fat wasting present.  There is no wt history in chart. It appears he was ~192.7 Lbs when arrived to ED on 11/20, though he was dehydrated. Rehydrated wt now 197.75 lbs  Noted that pt has been NPO since arrival on 11/20. Has gone 3 days with no oral intake and likely 4 days with insufficient intake. Pt with poor prognosis. He is full code. If  aggressive care is pursued, best practice would be enteral nutrition w/ postpyloric small bore tube, though other complicating medical issues may prohibit this. Will leave TF recommendations in event this route is elected  Noted Tmax was taken orally and is outlier from other temp readings. Will use avg temp of 37.2 for estimating needs  Patient is currently intubated on ventilator support. Abdomen firm.  MV: 9.1 L/min Temp (24hrs), Avg:99 F (37.2 C), Min:97.5 F (36.4 C), Max:101.5 F (38.6 C) Propofol: None  Labs: Recent  BGs: 105-145, Albumin: 2.0, Mag: 1.6, Phos: 2.8, K:3.5  BUN/creat:59/5.22-improved, Lactic acid:2.4, WBC 26.8 Meds: Insulin, Reglan,  Infusions: IVF, famotidine, IV abx, Bicarb Sedation/Analgesia: Precedex, fentanyl Vasopressor support: norepinephrine  Recent Labs  Lab 10/30/18 0331 10/30/18 1814 10/31/18 0402  NA 139 140 139  K 5.0 3.9 3.5  CL 110 100 90*  CO2 13* 25 33*  BUN 77* 77* 59*  CREATININE 6.95* 6.02* 5.22*  CALCIUM 5.8* 5.2* 5.5*  MG  --   --  1.6*  PHOS  --  3.5 2.8  GLUCOSE 244* 162* 136*   NUTRITION - FOCUSED PHYSICAL EXAM:   Most Recent Value  Orbital Region  Mild depletion  Upper Arm Region  No depletion  Thoracic and Lumbar Region  No depletion  Buccal Region  No depletion  Temple Region  No depletion  Clavicle Bone Region  No depletion  Clavicle and Acromion Bone Region  No depletion  Scapular Bone Region  No depletion  Dorsal  Hand  No depletion  Patellar Region  No depletion  Anterior Thigh Region  No depletion  Posterior Calf Region  No depletion  Edema (RD Assessment)  None  Hair  Reviewed  Eyes  Reviewed  Mouth  Reviewed  Skin  Reviewed  Nails  Reviewed       Diet Order:   Diet Order            Diet NPO time specified  Diet effective now             EDUCATION NEEDS:  No education needs have been identified at this time  Skin:  Skin Assessment: Reviewed RN Assessment  Last BM:  Unknown  Height:  Ht  Readings from Last 1 Encounters:  10/17/2018 6' (1.829 m)   Weight:  Wt Readings from Last 1 Encounters:  10/31/18 89.7 kg  No further wt hx available.   Ideal Body Weight:  80.91 kg  BMI:  Body mass index is 26.82 kg/m.  Estimated Nutritional Needs:  Kcal:  1930 kcals (PSU 2003 B) Protein:  144-161g Pro (1.6-1.8g/kg bw) Fluid:  Per MD goals  Burtis Junes RD, LDN, CNSC Clinical Nutrition Available Tues-Sat via Pager: [phone] 10/31/2018 12:02 PM

## 2018-10-31 NOTE — Progress Notes (Signed)
Notified Elink about Calcium 5.5 and Lactic acid 2.4. Awaiting orders.

## 2018-10-31 NOTE — Progress Notes (Signed)
Patient ID: Derek Riley, male   DOB: 01/21/1951, 67 y.o.   MRN: [id number] [facility] Progress Note   Assessment/ Plan:   1.  Acute kidney injury (unknown baseline renal function): Suspected to be from ATN from sepsis plus/minus contrast-induced nephropathy.  We will continue CRRT at this time as there is no indication for renal recovery-change prescription as he is now alkalemic on ABG/metabolic panel.  We will continue to leave him "net even" from a fluid balance standpoint. 2.  Anion gap metabolic acidosis: Corrected with pre-and post filter bicarbonate on CRRT-change prescription as he is now alkalotic. 3.  Acute severe pancreatitis-likely from gallstones: Going supportive management with bowel rest/management of metabolic abnormalities.  Plan noted for cholecystectomy in the future. 4.  Sepsis/MODS: Continue ventilator support and CRRT at this time.  On broad-spectrum antimicrobial coverage with Zosyn. 5.  Hypocalcemia: Secondary to metabolic derangements of pancreatitis-we will replace with intravenous calcium gluconate and check ionized calcium  Subjective:   Without acute events overnight since seen yesterday in the afternoon.   Objective:   BP 115/67   Pulse 74   Temp 99.2 F (37.3 C) (Axillary)   Resp 16   Ht 6' (1.829 m)   Wt 89.7 kg   SpO2 97%   BMI 26.82 kg/m   Intake/Output Summary (Last 24 hours) at 10/31/2018 0803 Last data filed at 10/31/2018 0800 Gross per 24 hour  Intake 4840.11 ml  Output 2532 ml  Net 2308.11 ml   Weight change: -2.1 kg  Physical Exam: Gen: Appears comfortable on ventilator CVS: Pulse regular rhythm, normal rate, S1 and S2 normal Resp: Anteriorly clear to auscultation, no rales/rhonchi Abd: Distended, firm, high-pitched bowel sounds Ext: Trace dependent/lower extremity edema  Imaging: Mr Abdomen Mrcp Wo Contrast  Result Date: 10/29/2018 CLINICAL DATA:  67 year old male with history of abdominal pain for 1 day.  Diffuse pain. Multiple episodes of emesis. Evaluate for potential pancreatitis. EXAM: MRI ABDOMEN WITHOUT CONTRAST  (INCLUDING MRCP) TECHNIQUE: Multiplanar multisequence MR imaging of the abdomen was performed. Heavily T2-weighted images of the biliary and pancreatic ducts were obtained, and three-dimensional MRCP images were rendered by post processing. COMPARISON:  No prior abdominal MRI. CT the abdomen and pelvis 10/27/2018. FINDINGS: Comment: Study is limited for detection and characterization of visceral and/or vascular lesions by lack of IV gadolinium. Lower chest: Trace bilateral pleural effusions lying dependently with areas of increased signal intensity in the lung bases which could reflect there is of atelectasis or sequela of recent aspiration. Hepatobiliary: Heterogeneous loss of signal intensity throughout the hepatic parenchyma on out of phase dual echo images, indicative of hepatic steatosis. No discrete cystic or solid hepatic lesions are confidently identified on today's noncontrast examination. No intra or extrahepatic biliary ductal dilatation noted on MRCP images. Common bile duct measures 5 mm in the porta hepatis. No filling defects in the common bile duct to suggest choledocholithiasis. No gallstones. Gallbladder is grossly unremarkable in appearance. Pancreas: Diffusely increased T2 signal intensity throughout the pancreas, as well as in the adjacent peripancreatic retroperitoneum. Some of this peripancreatic fluid is also T1 hyperintense, suggesting some proteinaceous/hemorrhagic contents. No discrete pancreatic mass. No pancreatic ductal dilatation noted on MRCP images. Spleen:  Unremarkable. Adrenals/Urinary Tract: Subcentimeter T1 hypointense, T2 hyperintense lesions in the kidneys bilaterally, incompletely characterized on today's noncontrast examination, but statistically likely to represent small cysts. No hydroureteronephrosis in the visualized portions of the abdomen. Bilateral  adrenal glands are normal in appearance. Stomach/Bowel: Visualized portions are unremarkable. Vascular/Lymphatic: No  aneurysm identified in the visualized abdominal vasculature. No lymphadenopathy noted in the abdomen. Other: Extensive edema throughout the retroperitoneum. Small volume of ascites most evident in the pericolic gutters bilaterally. Musculoskeletal: No aggressive appearing osseous lesions are noted in the visualized portions of the skeleton. IMPRESSION: 1. Changes of acute pancreatitis, with extensive pancreatic and peripancreatic inflammatory changes, including proteinaceous and/or hemorrhagic peripancreatic fluid. 2. No choledocholithiasis or findings of biliary tract obstruction. 3. Small volume of ascites. 4. Trace bilateral pleural effusions. 5. Increased signal intensity in the lung bases bilaterally which could reflect areas of passive subsegmental atelectasis, however, the possibility of aspiration pneumonitis should be considered. Correlation with chest radiographs is recommended. Electronically Signed   By: Vinnie Langton M.D.   On: 10/29/2018 10:46   Mr 3d Recon At Scanner  Result Date: 10/29/2018 CLINICAL DATA:  67 year old male with history of abdominal pain for 1 day. Diffuse pain. Multiple episodes of emesis. Evaluate for potential pancreatitis. EXAM: MRI ABDOMEN WITHOUT CONTRAST  (INCLUDING MRCP) TECHNIQUE: Multiplanar multisequence MR imaging of the abdomen was performed. Heavily T2-weighted images of the biliary and pancreatic ducts were obtained, and three-dimensional MRCP images were rendered by post processing. COMPARISON:  No prior abdominal MRI. CT the abdomen and pelvis 10/27/2018. FINDINGS: Comment: Study is limited for detection and characterization of visceral and/or vascular lesions by lack of IV gadolinium. Lower chest: Trace bilateral pleural effusions lying dependently with areas of increased signal intensity in the lung bases which could reflect there is of  atelectasis or sequela of recent aspiration. Hepatobiliary: Heterogeneous loss of signal intensity throughout the hepatic parenchyma on out of phase dual echo images, indicative of hepatic steatosis. No discrete cystic or solid hepatic lesions are confidently identified on today's noncontrast examination. No intra or extrahepatic biliary ductal dilatation noted on MRCP images. Common bile duct measures 5 mm in the porta hepatis. No filling defects in the common bile duct to suggest choledocholithiasis. No gallstones. Gallbladder is grossly unremarkable in appearance. Pancreas: Diffusely increased T2 signal intensity throughout the pancreas, as well as in the adjacent peripancreatic retroperitoneum. Some of this peripancreatic fluid is also T1 hyperintense, suggesting some proteinaceous/hemorrhagic contents. No discrete pancreatic mass. No pancreatic ductal dilatation noted on MRCP images. Spleen:  Unremarkable. Adrenals/Urinary Tract: Subcentimeter T1 hypointense, T2 hyperintense lesions in the kidneys bilaterally, incompletely characterized on today's noncontrast examination, but statistically likely to represent small cysts. No hydroureteronephrosis in the visualized portions of the abdomen. Bilateral adrenal glands are normal in appearance. Stomach/Bowel: Visualized portions are unremarkable. Vascular/Lymphatic: No aneurysm identified in the visualized abdominal vasculature. No lymphadenopathy noted in the abdomen. Other: Extensive edema throughout the retroperitoneum. Small volume of ascites most evident in the pericolic gutters bilaterally. Musculoskeletal: No aggressive appearing osseous lesions are noted in the visualized portions of the skeleton. IMPRESSION: 1. Changes of acute pancreatitis, with extensive pancreatic and peripancreatic inflammatory changes, including proteinaceous and/or hemorrhagic peripancreatic fluid. 2. No choledocholithiasis or findings of biliary tract obstruction. 3. Small volume of  ascites. 4. Trace bilateral pleural effusions. 5. Increased signal intensity in the lung bases bilaterally which could reflect areas of passive subsegmental atelectasis, however, the possibility of aspiration pneumonitis should be considered. Correlation with chest radiographs is recommended. Electronically Signed   By: Vinnie Langton M.D.   On: 10/29/2018 10:46   Portable Chest X-ray  Result Date: 10/30/2018 CLINICAL DATA:  Intubation. EXAM: PORTABLE CHEST 1 VIEW COMPARISON:  07/27/2017. FINDINGS: Endotracheal tube, NG tube, right IJ line stable position. Heart size normal. Bibasilar pulmonary infiltrates. No pleural  effusion or pneumothorax. IMPRESSION: 1. Endotracheal tube, NG tube, right IJ line in good anatomic position. 2.  Mild bibasilar atelectasis/infiltrates. Electronically Signed   By: Marcello Moores  Register   On: 10/30/2018 12:07   Dg Abd Portable 1v  Result Date: 10/30/2018 CLINICAL DATA:  OG tube placement.  Ileus. EXAM: PORTABLE ABDOMEN - 1 VIEW COMPARISON:  MRI 10/29/2018. FINDINGS: NG tube noted coiled in the no free air identified. Degenerative change thoracolumbar spine. Stomach. Distended loops of bowel noted. Follow-up abdominal series suggested to demonstrate resolution. IMPRESSION: 1.  NG tube noted coiled stomach. 2. Distended loops of bowel noted. Follow-up abdominal series suggested to demonstrate resolution. Electronically Signed   By: Marcello Moores  Register   On: 10/30/2018 12:42    Labs: BMET Recent Labs  Lab 10/31/2018 1139 10/18/2018 2323 10/29/18 1125 10/30/18 0331 10/30/18 1814 10/31/18 0402  NA 137 140 139 139 140 139  K 4.2 4.7 5.2* 5.0 3.9 3.5  CL 102 109 109 110 100 90*  CO2 19* 17* 17* 13* 25 33*  GLUCOSE 312* 215* 211* 244* 162* 136*  BUN 15 27* 44* 77* 77* 59*  CREATININE 1.72* 2.98* 4.91* 6.95* 6.02* 5.22*  CALCIUM 8.6* 8.0* 6.8* 5.8* 5.2* 5.5*  PHOS  --   --   --   --  3.5 2.8   CBC Recent Labs  Lab 10/29/18 1125 10/30/18 0606 10/30/18 1817  10/31/18 0402  WBC 26.9* 19.8* 29.1* 26.8*  HGB 14.8 12.3* 11.5* 11.3*  HCT 47.5 38.6* 34.8* 33.8*  MCV 90.8 91.7 89.2 87.3  PLT 158 147* 149* 142*    Medications:    . chlorhexidine gluconate (MEDLINE KIT)  15 mL Mouth Rinse BID  . Chlorhexidine Gluconate Cloth  6 each Topical Daily  . fentaNYL (SUBLIMAZE) injection  50 mcg Intravenous Once  . heparin injection (subcutaneous)  5,000 Units Subcutaneous Q8H  . insulin aspart  0-20 Units Subcutaneous Q4H  . mouth rinse  15 mL Mouth Rinse 10 times per day  . metoCLOPramide (REGLAN) injection  5 mg Intravenous Q6H  . rocuronium  1 mg/kg Intravenous Once  . sodium chloride flush  10-40 mL Intracatheter Q12H   Elmarie Shiley, MD 10/31/2018, 8:03 AM

## 2018-10-31 NOTE — Progress Notes (Signed)
CRITICAL VALUE ALERT  Critical Value:  Calcium 5.8  Date & Time Notied:  10/31/18 1649  Provider Notified: Chase Caller  Orders Received/Actions taken: calcium gluconate IVPB

## 2018-10-31 NOTE — Progress Notes (Signed)
Subjective/Chief Complaint: Patient decompensated yesterday, developed ARF, acidosis, leukocytosis, hypocalcemia and respiratory failure, moved to ICU and was intubated, did have some vomiting during intubation. Remains critically ill on CVVH and levo @33 .    Objective: Vital signs in last 24 hours: Temp:  [97.5 F (36.4 C)-101.5 F (38.6 C)] 99.2 F (37.3 C) (11/23 0732) Pulse Rate:  [72-118] 74 (11/23 0800) Resp:  [14-29] 16 (11/23 0800) BP: (75-130)/(52-84) 115/67 (11/23 0800) SpO2:  [92 %-100 %] 97 % (11/23 0800) Arterial Line BP: (90-159)/(49-72) 132/63 (11/23 0800) FiO2 (%):  [50 %-100 %] 60 % (11/23 0800) Weight:  [89.7 kg] 89.7 kg (11/23 0411) Last BM Date: (PTA)  Intake/Output from previous day: 11/22 0701 - 11/23 0700 In: 4695.5 [I.V.:4243.2; IV Piggyback:402.3] Out: 2532 [Urine:100; Emesis/NG output:925] Intake/Output this shift: Total I/O In: 144.6 [I.V.:69.6; Other:25; IV Piggyback:50] Out: 122 [Urine:45; Other:77]  General appearance: sedated, ill Resp: on vent Cardio: regular rate and rhythm GI: distended but compressible. bedside RN reports abdominal pressure 17 this am (Not paralyzed) Skin: Skin color, texture, turgor normal. No rashes or lesions Neurologic: sedated  Lab Results:  Recent Labs    10/30/18 1817 10/31/18 0402  WBC 29.1* 26.8*  HGB 11.5* 11.3*  HCT 34.8* 33.8*  PLT 149* 142*   BMET Recent Labs    10/30/18 1814 10/31/18 0402  NA 140 139  K 3.9 3.5  CL 100 90*  CO2 25 33*  GLUCOSE 162* 136*  BUN 77* 59*  CREATININE 6.02* 5.22*  CALCIUM 5.2* 5.5*   PT/INR No results for input(s): LABPROT, INR in the last 72 hours. ABG Recent Labs    10/30/18 1356 10/31/18 0228  PHART 7.232* 7.433  HCO3 19.9* 37.2*    Studies/Results: Mr Abdomen Mrcp Wo Contrast  Result Date: 10/29/2018 CLINICAL DATA:  67 year old male with history of abdominal pain for 1 day. Diffuse pain. Multiple episodes of emesis. Evaluate for potential  pancreatitis. EXAM: MRI ABDOMEN WITHOUT CONTRAST  (INCLUDING MRCP) TECHNIQUE: Multiplanar multisequence MR imaging of the abdomen was performed. Heavily T2-weighted images of the biliary and pancreatic ducts were obtained, and three-dimensional MRCP images were rendered by post processing. COMPARISON:  No prior abdominal MRI. CT the abdomen and pelvis 10/27/2018. FINDINGS: Comment: Study is limited for detection and characterization of visceral and/or vascular lesions by lack of IV gadolinium. Lower chest: Trace bilateral pleural effusions lying dependently with areas of increased signal intensity in the lung bases which could reflect there is of atelectasis or sequela of recent aspiration. Hepatobiliary: Heterogeneous loss of signal intensity throughout the hepatic parenchyma on out of phase dual echo images, indicative of hepatic steatosis. No discrete cystic or solid hepatic lesions are confidently identified on today's noncontrast examination. No intra or extrahepatic biliary ductal dilatation noted on MRCP images. Common bile duct measures 5 mm in the porta hepatis. No filling defects in the common bile duct to suggest choledocholithiasis. No gallstones. Gallbladder is grossly unremarkable in appearance. Pancreas: Diffusely increased T2 signal intensity throughout the pancreas, as well as in the adjacent peripancreatic retroperitoneum. Some of this peripancreatic fluid is also T1 hyperintense, suggesting some proteinaceous/hemorrhagic contents. No discrete pancreatic mass. No pancreatic ductal dilatation noted on MRCP images. Spleen:  Unremarkable. Adrenals/Urinary Tract: Subcentimeter T1 hypointense, T2 hyperintense lesions in the kidneys bilaterally, incompletely characterized on today's noncontrast examination, but statistically likely to represent small cysts. No hydroureteronephrosis in the visualized portions of the abdomen. Bilateral adrenal glands are normal in appearance. Stomach/Bowel: Visualized  portions are unremarkable. Vascular/Lymphatic: No aneurysm identified  in the visualized abdominal vasculature. No lymphadenopathy noted in the abdomen. Other: Extensive edema throughout the retroperitoneum. Small volume of ascites most evident in the pericolic gutters bilaterally. Musculoskeletal: No aggressive appearing osseous lesions are noted in the visualized portions of the skeleton. IMPRESSION: 1. Changes of acute pancreatitis, with extensive pancreatic and peripancreatic inflammatory changes, including proteinaceous and/or hemorrhagic peripancreatic fluid. 2. No choledocholithiasis or findings of biliary tract obstruction. 3. Small volume of ascites. 4. Trace bilateral pleural effusions. 5. Increased signal intensity in the lung bases bilaterally which could reflect areas of passive subsegmental atelectasis, however, the possibility of aspiration pneumonitis should be considered. Correlation with chest radiographs is recommended. Electronically Signed   By: Vinnie Langton M.D.   On: 10/29/2018 10:46   Mr 3d Recon At Scanner  Result Date: 10/29/2018 CLINICAL DATA:  67 year old male with history of abdominal pain for 1 day. Diffuse pain. Multiple episodes of emesis. Evaluate for potential pancreatitis. EXAM: MRI ABDOMEN WITHOUT CONTRAST  (INCLUDING MRCP) TECHNIQUE: Multiplanar multisequence MR imaging of the abdomen was performed. Heavily T2-weighted images of the biliary and pancreatic ducts were obtained, and three-dimensional MRCP images were rendered by post processing. COMPARISON:  No prior abdominal MRI. CT the abdomen and pelvis 10/27/2018. FINDINGS: Comment: Study is limited for detection and characterization of visceral and/or vascular lesions by lack of IV gadolinium. Lower chest: Trace bilateral pleural effusions lying dependently with areas of increased signal intensity in the lung bases which could reflect there is of atelectasis or sequela of recent aspiration. Hepatobiliary: Heterogeneous  loss of signal intensity throughout the hepatic parenchyma on out of phase dual echo images, indicative of hepatic steatosis. No discrete cystic or solid hepatic lesions are confidently identified on today's noncontrast examination. No intra or extrahepatic biliary ductal dilatation noted on MRCP images. Common bile duct measures 5 mm in the porta hepatis. No filling defects in the common bile duct to suggest choledocholithiasis. No gallstones. Gallbladder is grossly unremarkable in appearance. Pancreas: Diffusely increased T2 signal intensity throughout the pancreas, as well as in the adjacent peripancreatic retroperitoneum. Some of this peripancreatic fluid is also T1 hyperintense, suggesting some proteinaceous/hemorrhagic contents. No discrete pancreatic mass. No pancreatic ductal dilatation noted on MRCP images. Spleen:  Unremarkable. Adrenals/Urinary Tract: Subcentimeter T1 hypointense, T2 hyperintense lesions in the kidneys bilaterally, incompletely characterized on today's noncontrast examination, but statistically likely to represent small cysts. No hydroureteronephrosis in the visualized portions of the abdomen. Bilateral adrenal glands are normal in appearance. Stomach/Bowel: Visualized portions are unremarkable. Vascular/Lymphatic: No aneurysm identified in the visualized abdominal vasculature. No lymphadenopathy noted in the abdomen. Other: Extensive edema throughout the retroperitoneum. Small volume of ascites most evident in the pericolic gutters bilaterally. Musculoskeletal: No aggressive appearing osseous lesions are noted in the visualized portions of the skeleton. IMPRESSION: 1. Changes of acute pancreatitis, with extensive pancreatic and peripancreatic inflammatory changes, including proteinaceous and/or hemorrhagic peripancreatic fluid. 2. No choledocholithiasis or findings of biliary tract obstruction. 3. Small volume of ascites. 4. Trace bilateral pleural effusions. 5. Increased signal  intensity in the lung bases bilaterally which could reflect areas of passive subsegmental atelectasis, however, the possibility of aspiration pneumonitis should be considered. Correlation with chest radiographs is recommended. Electronically Signed   By: Vinnie Langton M.D.   On: 10/29/2018 10:46   Portable Chest Xray  Result Date: 10/31/2018 CLINICAL DATA:  Aspiration EXAM: PORTABLE CHEST 1 VIEW COMPARISON:  10/30/2018 and prior exams FINDINGS: Endotracheal tube, NG tube and RIGHT IJ central venous catheter are unchanged. Cardiomediastinal silhouette is unchanged.  Slightly increased RIGHT LOWER lung opacity noted. No pneumothorax, definite effusion or acute bony abnormality. IMPRESSION: Slightly increased RIGHT LOWER lung opacity which may represent atelectasis or airspace disease. Aspiration/pneumonia is not excluded. Electronically Signed   By: Margarette Canada M.D.   On: 10/31/2018 08:23   Portable Chest X-ray  Result Date: 10/30/2018 CLINICAL DATA:  Intubation. EXAM: PORTABLE CHEST 1 VIEW COMPARISON:  07/27/2017. FINDINGS: Endotracheal tube, NG tube, right IJ line stable position. Heart size normal. Bibasilar pulmonary infiltrates. No pleural effusion or pneumothorax. IMPRESSION: 1. Endotracheal tube, NG tube, right IJ line in good anatomic position. 2.  Mild bibasilar atelectasis/infiltrates. Electronically Signed   By: Marcello Moores  Register   On: 10/30/2018 12:07   Dg Abd Portable 1v  Result Date: 10/30/2018 CLINICAL DATA:  OG tube placement.  Ileus. EXAM: PORTABLE ABDOMEN - 1 VIEW COMPARISON:  MRI 10/29/2018. FINDINGS: NG tube noted coiled in the no free air identified. Degenerative change thoracolumbar spine. Stomach. Distended loops of bowel noted. Follow-up abdominal series suggested to demonstrate resolution. IMPRESSION: 1.  NG tube noted coiled stomach. 2. Distended loops of bowel noted. Follow-up abdominal series suggested to demonstrate resolution. Electronically Signed   By: Marcello Moores  Register    On: 10/30/2018 12:42    Anti-infectives: Anti-infectives (From admission, onward)   Start     Dose/Rate Route Frequency Ordered Stop   10/29/18 1900  piperacillin-tazobactam (ZOSYN) IVPB 2.25 g     2.25 g 100 mL/hr over 30 Minutes Intravenous Every 6 hours 10/29/18 1741     10/30/2018 2000  piperacillin-tazobactam (ZOSYN) IVPB 3.375 g  Status:  Discontinued     3.375 g 12.5 mL/hr over 240 Minutes Intravenous Every 8 hours 10/27/2018 1156 10/29/18 1741   10/16/2018 1230  piperacillin-tazobactam (ZOSYN) IVPB 3.375 g     3.375 g 100 mL/hr over 30 Minutes Intravenous  Once 10/21/2018 1156 10/26/2018 1506      Assessment/Plan: Severe pancreatitis with multisystem organ failure. Continue care per critical care team. Prognosis is poor. Will follow peripherally.   LOS: 3 days    Clovis Riley 10/31/2018

## 2018-10-31 NOTE — Progress Notes (Signed)
Patient with multisystem organ failure as a consequence of gallstone pancreatitis, requiring pressors.  The patient's liver chemistries are improving and his orogastric tube drainage is bilious in character, suggesting the absence of biliary obstruction.  Most likely, he passed a common duct stone in the process of causing his gallstone pancreatitis.  I would consider a fluid challenge with a colloid (high doses of albumin) keeping in mind that these patients with severe pancreatitis do an enormous amount of third spacing.  I will obtain a KUB in the morning to see if his small bowel dilatation is improving with NG suction.  On exam currently, bowel sounds are absent, as expected given his severe medical illness and ongoing fentanyl infusion.  Cleotis Nipper, M.D. Pager [phone] If no answer or after 5 PM call [phone]

## 2018-10-31 NOTE — Progress Notes (Signed)
PCCM INTERVAL PROGRESS NOTE   GI recommendations for fluid challenge with colloid noted. Patient in shock on 58mcg levophed with anasarca. Likely third spacing. Will try a dose of albumin and see how this BP pressor demand responds.    Georgann Housekeeper, AGACNP-BC [place] Pager [phone] or [phone]  10/31/2018 11:55 PM

## 2018-10-31 NOTE — Progress Notes (Signed)
Pt to receive 2L LR bolus per Dr. Chase Caller, will keep pt 2L+ on I&O.

## 2018-10-31 NOTE — Progress Notes (Signed)
CRITICAL VALUE ALERT  Critical Value:  Calcium 5.5  Date & Time Notied:   10/31/2018  6606

## 2018-11-01 ENCOUNTER — Inpatient Hospital Stay (HOSPITAL_COMMUNITY): Payer: Medicare Other

## 2018-11-01 LAB — RENAL FUNCTION PANEL
ALBUMIN: 2.5 g/dL — AB (ref 3.5–5.0)
ANION GAP: 12 (ref 5–15)
Albumin: 1.9 g/dL — ABNORMAL LOW (ref 3.5–5.0)
Anion gap: 11 (ref 5–15)
BUN: 37 mg/dL — AB (ref 8–23)
BUN: 35 mg/dL — ABNORMAL HIGH (ref 8–23)
CHLORIDE: 100 mmol/L (ref 98–111)
CO2: 25 mmol/L (ref 22–32)
CO2: 23 mmol/L (ref 22–32)
CREATININE: 3.39 mg/dL — AB (ref 0.61–1.24)
Calcium: 6.2 mg/dL — CL (ref 8.9–10.3)
Calcium: 6.8 mg/dL — ABNORMAL LOW (ref 8.9–10.3)
Chloride: 101 mmol/L (ref 98–111)
Creatinine, Ser: 3.32 mg/dL — ABNORMAL HIGH (ref 0.61–1.24)
GFR calc Af Amer: 21 mL/min — ABNORMAL LOW (ref >60)
GFR, EST AFRICAN AMERICAN: 20 mL/min — AB (ref >60)
GFR, EST NON AFRICAN AMERICAN: 17 mL/min — AB (ref >60)
GFR, EST NON AFRICAN AMERICAN: 18 mL/min — AB (ref >60)
Glucose, Bld: 136 mg/dL — ABNORMAL HIGH (ref 70–99)
Glucose, Bld: 96 mg/dL (ref 70–99)
PHOSPHORUS: 3 mg/dL (ref 2.5–4.6)
POTASSIUM: 4.3 mmol/L (ref 3.5–5.1)
Phosphorus: 3.7 mg/dL (ref 2.5–4.6)
Potassium: 4.4 mmol/L (ref 3.5–5.1)
SODIUM: 136 mmol/L (ref 135–145)
Sodium: 136 mmol/L (ref 135–145)

## 2018-11-01 LAB — COMPREHENSIVE METABOLIC PANEL
ALBUMIN: 1.9 g/dL — AB (ref 3.5–5.0)
ALK PHOS: 74 U/L (ref 38–126)
ALT: 90 U/L — ABNORMAL HIGH (ref 0–44)
ANION GAP: 10 (ref 5–15)
AST: 55 U/L — ABNORMAL HIGH (ref 15–41)
BILIRUBIN TOTAL: 2.5 mg/dL — AB (ref 0.3–1.2)
BUN: 37 mg/dL — ABNORMAL HIGH (ref 8–23)
CO2: 25 mmol/L (ref 22–32)
Calcium: 6.2 mg/dL — CL (ref 8.9–10.3)
Chloride: 100 mmol/L (ref 98–111)
Creatinine, Ser: 3.41 mg/dL — ABNORMAL HIGH (ref 0.61–1.24)
GFR, EST AFRICAN AMERICAN: 20 mL/min — AB (ref >60)
GFR, EST NON AFRICAN AMERICAN: 17 mL/min — AB (ref >60)
GLUCOSE: 135 mg/dL — AB (ref 70–99)
POTASSIUM: 4.4 mmol/L (ref 3.5–5.1)
Sodium: 135 mmol/L (ref 135–145)
TOTAL PROTEIN: 5.6 g/dL — AB (ref 6.5–8.1)

## 2018-11-01 LAB — GLUCOSE, CAPILLARY
GLUCOSE-CAPILLARY: 116 mg/dL — AB (ref 70–99)
Glucose-Capillary: 105 mg/dL — ABNORMAL HIGH (ref 70–99)
Glucose-Capillary: 132 mg/dL — ABNORMAL HIGH (ref 70–99)
Glucose-Capillary: 133 mg/dL — ABNORMAL HIGH (ref 70–99)
Glucose-Capillary: 84 mg/dL (ref 70–99)
Glucose-Capillary: 91 mg/dL (ref 70–99)

## 2018-11-01 LAB — CBC
HEMATOCRIT: 30.4 % — AB (ref 39.0–52.0)
HEMOGLOBIN: 9.4 g/dL — AB (ref 13.0–17.0)
MCH: 28.2 pg (ref 26.0–34.0)
MCHC: 30.9 g/dL (ref 30.0–36.0)
MCV: 91.3 fL (ref 80.0–100.0)
Platelets: 114 10*3/uL — ABNORMAL LOW (ref 150–400)
RBC: 3.33 MIL/uL — ABNORMAL LOW (ref 4.22–5.81)
RDW: 14.2 % (ref 11.5–15.5)
WBC: 19.9 10*3/uL — AB (ref 4.0–10.5)
nRBC: 0.2 % (ref 0.0–0.2)

## 2018-11-01 LAB — CALCIUM, IONIZED

## 2018-11-01 LAB — PROTIME-INR
INR: 1.38
PROTHROMBIN TIME: 16.8 s — AB (ref 11.4–15.2)

## 2018-11-01 LAB — TRIGLYCERIDES: TRIGLYCERIDES: 231 mg/dL — AB (ref <150)

## 2018-11-01 LAB — LACTIC ACID, PLASMA: Lactic Acid, Venous: 1.6 mmol/L (ref 0.5–1.9)

## 2018-11-01 LAB — LIPASE, BLOOD: Lipase: 81 U/L — ABNORMAL HIGH (ref 11–51)

## 2018-11-01 LAB — MAGNESIUM: MAGNESIUM: 2.5 mg/dL — AB (ref 1.7–2.4)

## 2018-11-01 MED ORDER — ALBUMIN HUMAN 25 % IV SOLN
50.0000 g | Freq: Once | INTRAVENOUS | Status: AC
  Administered 2018-11-01: 50 g via INTRAVENOUS
  Filled 2018-11-01: qty 200

## 2018-11-01 MED ORDER — SODIUM CHLORIDE 0.9 % IV SOLN
1.0000 g | Freq: Once | INTRAVENOUS | Status: AC
  Administered 2018-11-01: 1 g via INTRAVENOUS
  Filled 2018-11-01: qty 10

## 2018-11-01 MED ORDER — ALBUMIN HUMAN 5 % IV SOLN
12.5000 g | Freq: Once | INTRAVENOUS | Status: AC
  Administered 2018-11-01: 12.5 g via INTRAVENOUS
  Filled 2018-11-01: qty 250

## 2018-11-01 MED ORDER — ETOMIDATE 2 MG/ML IV SOLN
20.0000 mg | Freq: Once | INTRAVENOUS | Status: AC
  Administered 2018-11-01: 20 mg via INTRAVENOUS

## 2018-11-01 NOTE — Procedures (Signed)
Intubation Procedure Note FINNEAN CERAMI [id number] Aug 30, 1951  Procedure: Intubation Indications: Airway protection and maintenance  Procedure Details Consent: Unable to obtain consent because of altered level of consciousness. and emergent situation. Time Out: Verified patient identification, verified procedure, site/side was marked, verified correct patient position, special equipment/implants available, medications/allergies/relevent history reviewed, required imaging and test results available.  Performed  Maximum clean technique was used including hand hygiene, gloves, mask.  4 MAC Glidescope  Medications: 9m Versed Fentanyl infusion ongoing at 4019m Etomidate 2026mGrade 1 Airway view  Evaluation Hemodynamic Status: BP stable throughout; O2 sats: stable throughout Patient's Current Condition: stable Complications: No apparent complications Patient did tolerate procedure well. Chest X-ray ordered to verify placement.  CXR: pending.   PauGeorgann HousekeeperGACNP-BC [place] [phone]/24/2019 4:15 AM

## 2018-11-01 NOTE — Progress Notes (Signed)
Patient ID: Derek Riley, male   DOB: 02/18/1951, 67 y.o.   MRN: [id number] [facility] Progress Note   Assessment/ Plan:   1.  Acute kidney injury (unknown baseline renal function): Suspected to be from ATN from sepsis plus/minus contrast-induced nephropathy.  Continue CRRT at the current prescription with no ultrafiltration yet given his continued pressor dependent hypotension.  We will give some IV albumin. 2.  Anion gap metabolic acidosis: Corrected with CRRT. 3.  Acute severe pancreatitis-likely from gallstones: Going supportive management with bowel rest/management of metabolic abnormalities.  Unfortunately, poor prognosis. 4.  Sepsis/MODS: Continue ventilator support and CRRT at this time.  On broad-spectrum antimicrobial coverage with Zosyn. 5.  Hypocalcemia: Secondary to metabolic derangements of pancreatitis-hypocalcemia noted however, ionized calcium pending and corrected calcium acceptable at 7.8  Subjective:   Self extubated this morning and required reintubation with restraints/sedation.   Objective:   BP 115/61   Pulse 62   Temp (!) 96.7 F (35.9 C) (Axillary)   Resp 16   Ht 6' (1.829 m)   Wt 92.1 kg   SpO2 100%   BMI 27.54 kg/m   Intake/Output Summary (Last 24 hours) at 11/01/2018 0810 Last data filed at 11/01/2018 0600 Gross per 24 hour  Intake 5440.57 ml  Output 3504 ml  Net 1936.57 ml   Weight change: 2.4 kg  Physical Exam: Gen: Appears comfortable on ventilator-intermittent limb jerking CVS: Pulse regular rhythm, normal rate, S1 and S2 normal Resp: Anteriorly clear to auscultation, no rales/rhonchi Abd: Distended, firm, high-pitched bowel sounds Ext: 1-2+ lower and upper extremity edema  Imaging: Dg Abd 1 View  Result Date: 11/01/2018 CLINICAL DATA:  NG tube placement EXAM: ABDOMEN - 1 VIEW COMPARISON:  10/30/2018 FINDINGS: Enteric tube with tip in the upper mid abdomen consistent with location in the distal stomach. Gaseous  distention of upper abdominal bowel, probably representing transverse colon. No change since prior study. Stool in the rectum and ascending colon. Degenerative changes in the spine. IMPRESSION: Enteric tube tip is in the upper mid abdomen consistent with location in the distal stomach. Gaseous distention of upper abdominal bowel, probably transverse colon, unchanged. Electronically Signed   By: Lucienne Capers M.D.   On: 11/01/2018 06:34   Dg Chest Port 1 View  Result Date: 11/01/2018 CLINICAL DATA:  Intubation EXAM: PORTABLE CHEST 1 VIEW COMPARISON:  10/31/2018 FINDINGS: Endotracheal tube tip measures 5 cm above the carina. Enteric tube tip is below the left hemidiaphragm but off the field of view. Right central venous catheter with tip over the low SVC region. No pneumothorax. Shallow inspiration with atelectasis in the lung bases. No blunting of costophrenic angles. No pneumothorax. Mediastinal contours appear intact. Degenerative changes in the spine. IMPRESSION: Appliances appear in satisfactory position. Shallow inspiration with atelectasis in the lung bases. Electronically Signed   By: Lucienne Capers M.D.   On: 11/01/2018 04:36   Portable Chest Xray  Result Date: 10/31/2018 CLINICAL DATA:  Aspiration EXAM: PORTABLE CHEST 1 VIEW COMPARISON:  10/30/2018 and prior exams FINDINGS: Endotracheal tube, NG tube and RIGHT IJ central venous catheter are unchanged. Cardiomediastinal silhouette is unchanged. Slightly increased RIGHT LOWER lung opacity noted. No pneumothorax, definite effusion or acute bony abnormality. IMPRESSION: Slightly increased RIGHT LOWER lung opacity which may represent atelectasis or airspace disease. Aspiration/pneumonia is not excluded. Electronically Signed   By: Margarette Canada M.D.   On: 10/31/2018 08:23   Portable Chest X-ray  Result Date: 10/30/2018 CLINICAL DATA:  Intubation. EXAM: PORTABLE CHEST 1 VIEW  COMPARISON:  07/27/2017. FINDINGS: Endotracheal tube, NG tube, right IJ  line stable position. Heart size normal. Bibasilar pulmonary infiltrates. No pleural effusion or pneumothorax. IMPRESSION: 1. Endotracheal tube, NG tube, right IJ line in good anatomic position. 2.  Mild bibasilar atelectasis/infiltrates. Electronically Signed   By: Marcello Moores  Register   On: 10/30/2018 12:07   Dg Abd Portable 1v  Result Date: 10/30/2018 CLINICAL DATA:  OG tube placement.  Ileus. EXAM: PORTABLE ABDOMEN - 1 VIEW COMPARISON:  MRI 10/29/2018. FINDINGS: NG tube noted coiled in the no free air identified. Degenerative change thoracolumbar spine. Stomach. Distended loops of bowel noted. Follow-up abdominal series suggested to demonstrate resolution. IMPRESSION: 1.  NG tube noted coiled stomach. 2. Distended loops of bowel noted. Follow-up abdominal series suggested to demonstrate resolution. Electronically Signed   By: Marcello Moores  Register   On: 10/30/2018 12:42   US Abdomen Limited Ruq  Result Date: 10/31/2018 CLINICAL DATA:  Pancreatitis. EXAM: ULTRASOUND ABDOMEN LIMITED RIGHT UPPER QUADRANT COMPARISON:  None. FINDINGS: Gallbladder: No gallstones or wall thickening visualized. No sonographic Murphy sign noted by sonographer. Common bile duct: Diameter: 4 mm Liver: Liver is diffusely echogenic indicating fatty infiltration. Hypoechoic focus within the RIGHT liver lobe measures 2.1 x 1.4 x 1.7 cm, without obvious correlate on recent MRI abdomen of 10/29/2018 or CT abdomen of 10/12/2018, presumed focal fatty sparing. Portal vein is patent on color Doppler imaging with normal direction of blood flow towards the liver. RIGHT pleural effusion. IMPRESSION: 1. No evidence of acute cholecystitis.  No gallstones seen. 2. Fatty infiltration of the liver. 3. Hypoechoic focus within the RIGHT liver lobe, measuring 2.1 cm, without corresponding finding on recent MRI abdomen, suspected focal fatty sparing. 4. RIGHT pleural effusion. Electronically Signed   By: Franki Cabot M.D.   On: 10/31/2018 19:37     Labs: BMET Recent Labs  Lab 10/29/18 1125 10/30/18 0331 10/30/18 1814 10/31/18 0402 10/31/18 1559 11/01/18 0449 11/01/18 0450  NA 139 139 140 139 136 135 136  K 5.2* 5.0 3.9 3.5 4.0 4.4 4.4  CL 109 110 100 90* 95* 100 100  CO2 17* 13* 25 33* _0 GLUCOSE 211* 244* 162* 136* 162* 135* 136*  BUN 44* 77* 77* 59* 45* 37* 37*  CREATININE 4.91* 6.95* 6.02* 5.22* 3.92* 3.41* 3.39*  CALCIUM 6.8* 5.8* 5.2* 5.5* 5.8* 6.2* 6.2*  PHOS  --   --  3.5 2.8 4.1  --  3.7   CBC Recent Labs  Lab 10/30/18 0606 10/30/18 1817 10/31/18 0402 11/01/18 0449  WBC 19.8* 29.1* 26.8* 19.9*  HGB 12.3* 11.5* 11.3* 9.4*  HCT 38.6* 34.8* 33.8* 30.4*  MCV 91.7 89.2 87.3 91.3  PLT 147* 149* 142* 114*    Medications:    . chlorhexidine gluconate (MEDLINE KIT)  15 mL Mouth Rinse BID  . Chlorhexidine Gluconate Cloth  6 each Topical Daily  . fentaNYL (SUBLIMAZE) injection  50 mcg Intravenous Once  . heparin injection (subcutaneous)  5,000 Units Subcutaneous Q8H  . insulin aspart  0-20 Units Subcutaneous Q4H  . mouth rinse  15 mL Mouth Rinse 10 times per day  . sodium chloride flush  10-40 mL Intracatheter Q12H   Elmarie Shiley, MD 11/01/2018, 8:10 AM

## 2018-11-01 NOTE — Progress Notes (Signed)
NAME:  Derek Riley, MRN:  [id number], DOB:  1950/12/19, LOS: 4 ADMISSION DATE:  10/19/2018, CONSULTATION DATE:  11/22 REFERRING MD:  11/22, CHIEF COMPLAINT:  Worsening metabolic acidosis and renal failure w/ clinical decline  Brief History   67 year old male admitted 11/20 w/ gallstone pancreatitis. PCCM consulted 11/22 for progressive metabolic acidosis, acute renal failure and persistent clinical decline.   History of present illness   67 year old white male admitted on 11/20 w/ one day h/o acute abd pain. The pain was progressive and associated w/ nausea and vomiting. He described the pain as gnawing subxiphoid that had been progressive in nature. Lipase was 50341, creatinine 1. CT w/ acute pancreatitis. He was admitted to the medical service. GI and surgical service consulted, underwent MRCP that showed extensive pancreatic, peripancreatic inflammation w/ either proteinaceous or hemorrhagic peripancreatic fluid. There was no choledocholithiasis or findings c/w biliary tract obstruction. Followed by surgery w/ plans for eventual cholecystectomy. He was been treated to date w/ IVFs, empiric abx and supportive care. In spite of this he developed worsening renal failure and metabolic acidosis. PCCM asked to see on 11/22 for continued decline in clinical status, acidosis and renal failure    Past Medical History  Diabetes, gallstones, hypertension.  Significant Hospital Events   11/20 transferred to [facility] from [place] w/ acute gallstone pancreatitis. GI & surgicals services consulted. Started On supportive IVFs, empiric zosyn and analgesic support.  Surgical service is planning on cholecystectomy once pancreatitis improved.  Initial lipase on arrival to [place], creatinine 1.72 11/21: MRCP 11/21 showing extensive pancreatitis, possible hemorrhagic peripancreatic fluid no choledocholithiasis, lipase 3361, creatinine up to 4.91, anion gap 13.  No role for ERCP continued supportive  care 11/22: Creatinine up to 6.95, gap now 16, progressive diffuse mottling, worse pain, critical care consulted for shock, and progressive multiorgan failure, moving to intensive care for CRRT as well as hemodynamic support. 3.7 liters + since admit, developed worsening resp failure requiring intubation shortly after arrival. Possible aspiration during intubation  11/23 self extubated required reintubation  Consults:  11/20 GI and surgical services 11/22 critical care  Procedures:  11/22 Foley catheter 11/22 right IJ CVL (HD)>>> 11/22 OETT 11/22>>>  Significant Diagnostic Tests:  MRCP 11/21: Acute pancreatitis with extensive pancreatic and periPancreatic inflammatory changes, including proteinaceous and/or hemorrhagic peri-pancreatic fluid.  No choledocholithiasis or biliary tract obstruction.  Small volume of ascites, trace bilateral pleural effusions.  Bilateral basilar volume loss  Micro Data:  Blood cultures 11/23>> Sputum culture 11/23  Antimicrobials:  Zosyn 11/20  Interim history/subjective:  Patient remains alert follows commands , on sedation Decreased pressor needs, weaning levophed -on 35mcg  Self extubated last p.m. requiring reintubation Continues on CRRT, remains anuric   Objective   Blood pressure (!) 104/52, pulse 65, temperature (!) 96.7 F (35.9 C), temperature source Axillary, resp. rate 17, height 6' (1.829 m), weight 92.1 kg, SpO2 98 %. CVP:  [16 mmHg] 16 mmHg  Vent Mode: PRVC FiO2 (%):  [40 %-100 %] 40 % Set Rate:  [16 bmp] 16 bmp Vt Set:  [600 mL] 600 mL PEEP:  [5 cmH20] 5 cmH20 Plateau Pressure:  [18 cmH20-26 cmH20] 19 cmH20   Intake/Output Summary (Last 24 hours) at 11/01/2018 1100 Last data filed at 11/01/2018 1100 Gross per 24 hour  Intake 5546.22 ml  Output 3666 ml  Net 1880.22 ml   Filed Weights   10/30/18 0410 10/31/18 0411 11/01/18 0500  Weight: 91.8 kg 89.7 kg 92.1 kg  Examination: General: Elderly male appears calm on  ventilator   HENT: Dry mucosa, ETT in place   Lungs: Manage breath sounds in the bases, on vent   Cardiovascular: RRR, no MRG Abdomen: Obese, hypoactive bowel sounds, OGT   Extremities: Warm no edema   Neuro: Sedated, calm, follows commands  GU: Foley in place  Resolved Hospital Problem list     Assessment & Plan:   Acute gallstone pancreatitis, currently no evidence of choledocholithiasis from MRCP, however imaging does suggest progressive pancreatic and peripancreatic fluid +/- hemorrhage.  Plan Continue n.p.o. Status Trend lipase Treat pain Eventually will need cholecystectomy   Acute hypoxic respiratory failure. Developed worsening respiratory distress shortly after arrival to ICU.  C/b possible aspiration event  -CXR c/w RLL aspiration  Plan Full vent support Tr  CXR  VAP bundle  Cont abx for asp coverage  Distributive shock; meets SIRS criteria from pancreatitis w/ clinical decline raising concern for sepsis/septic shock  11/23 Cortisol 32 , LA tr down 1.6  Plan Continue IV Abx    Follow cx data    CVP-unable to obtain due lack of access .   Wean  Norepinephrine for MAP > 65  As able    Progressive anion gap metabolic acidosis-> almost certainly lactic acidosis by clinical exam but could certainly be element of renal failure contributing. -  11/24 Gap 11.  Plan Tr lactate and bmet   AKI -baseline Cr 1. Suspect primarily hemodynamically mediated +/- contrast induced nephropathy Plan Cont CRRT  Serial chemistries.  -Renal following   Fluid and electrolyte imbalance: hyperkalemia, hypocalcemia, hypomagnesium -resolved  Plan Replace as indicated.  Serial chemistries  Leukocytosis  -improving Plan Trend cbc  Elevated LFTs in setting of colicystitis (in [place] imaging showed gallstones and CBD stones) these have seemingly passed as were neg on MRCP LFTs trending down, lipase tr down  Plan Intermittent LFTs and lipase   DM w/ hyperglycemia Plan ssi  protocol.    Anemia  Plan Trend cbc  Agitation  -cont sedation protocol   Best practice:  Diet: NPO  Pain/Anxiety/Delirium protocol (if indicated): fent /precedex  VAP protocol (if indicated):  DVT prophylaxis: change from LMWH to [facility] heparin 11/22 GI prophylaxis: PPI (active reflux) Glucose control: ssi Mobility: br Code Status: full code Family Communication: pending Disposition: ICU   Labs   CBC: Recent Labs  Lab 10/29/18 1125 10/30/18 0606 10/30/18 1817 10/31/18 0402 11/01/18 0449  WBC 26.9* 19.8* 29.1* 26.8* 19.9*  HGB 14.8 12.3* 11.5* 11.3* 9.4*  HCT 47.5 38.6* 34.8* 33.8* 30.4*  MCV 90.8 91.7 89.2 87.3 91.3  PLT 158 147* 149* 142* 114*    Basic Metabolic Panel: Recent Labs  Lab 10/30/18 1814 10/31/18 0402 10/31/18 1559 11/01/18 0449 11/01/18 0450  NA 140 139 136 135 136  K 3.9 3.5 4.0 4.4 4.4  CL 100 90* 95* 100 100  CO2 25 33* 29 25 25   GLUCOSE 162* 136* 162* 135* 136*  BUN 77* 59* 45* 37* 37*  CREATININE 6.02* 5.22* 3.92* 3.41* 3.39*  CALCIUM 5.2* 5.5* 5.8* 6.2* 6.2*  MG  --  1.6*  --  2.5*  --   PHOS 3.5 2.8 4.1  --  3.7   GFR: Estimated Creatinine Clearance: 23.2 mL/min (A) (by C-G formula based on SCr of 3.39 mg/dL (H)). Recent Labs  Lab 10/30/18 0606 10/30/18 1542 10/30/18 1817 10/31/18 0402 11/01/18 0449 11/01/18 0450  WBC 19.8*  --  29.1* 26.8* 19.9*  --   LATICACIDVEN  --  1.8  --  2.4*  --  1.6    Liver Function Tests: Recent Labs  Lab 10/26/2018 2323 10/29/18 1125 10/30/18 0331 10/30/18 1814 10/31/18 0402 10/31/18 1559 11/01/18 0449 11/01/18 0450  AST 358* 399* 207*  --  84*  --  55*  --   ALT 422* 374* 260*  --  147*  --  90*  --   ALKPHOS 55 50 48  --  72  --  74  --   BILITOT 6.3* 6.3* 4.0*  --  2.3*  --  2.5*  --   PROT 6.7 6.4* 5.9*  --  5.7*  --  5.6*  --   ALBUMIN 3.4* 2.9* 2.3* 2.1* 2.0* 1.7* 1.9* 1.9*   Recent Labs  Lab 11/05/2018 1524 10/29/18 1125 10/31/18 0402 11/01/18 0449  LIPASE 3,700* 3,361*  304* 81*   No results for input(s): AMMONIA in the last 168 hours.  ABG    Component Value Date/Time   PHART 7.433 10/31/2018 0228   PCO2ART 56.4 (H) 10/31/2018 0228   PO2ART 82.0 (L) 10/31/2018 0228   HCO3 37.2 (H) 10/31/2018 0228   TCO2 39 (H) 10/31/2018 0228   ACIDBASEDEF 8.0 (H) 10/30/2018 1356   O2SAT 95.0 10/31/2018 0228     Coagulation Profile: Recent Labs  Lab 11/01/18 0014  INR 1.38    Cardiac Enzymes: No results for input(s): CKTOTAL, CKMB, CKMBINDEX, TROPONINI in the last 168 hours.  HbA1C: Hgb A1c MFr Bld  Date/Time Value Ref Range Status  10/12/2018 11:23 PM 8.0 (H) 4.8 - 5.6 % Final    Comment:    (NOTE) Pre diabetes:          5.7%-6.4% Diabetes:              >6.4% Glycemic control for   <7.0% adults with diabetes     CBG: Recent Labs  Lab 10/31/18 1521 10/31/18 1932 10/31/18 2358 11/01/18 0436 11/01/18 0739  GLUCAP 144* 126* 105* 116* 132*     Past Medical History  He,  has a past medical history of Diabetes mellitus without complication ([place]), Gallstone (10/2018), Hyperlipidemia, and Hypertension.    Critical care time:       Chao Blazejewski NP-C  [facility]  [phone]   11/01/2018

## 2018-11-01 NOTE — Progress Notes (Signed)
PCCM INTERVAL PROGRESS NOTE   Called to bedside in the event of self extubation. Tube partially retracted by patient. Large cuff leak.  Plan: -Re-intubate -Start using versed pushes.  -soft wrist restraints.   Georgann Housekeeper, AGACNP-BC [place] Pager [phone] or [phone]  11/01/2018 4:09 AM

## 2018-11-01 NOTE — Progress Notes (Addendum)
No evident benefit from 50gm albumin infusion, per conversation w/ nurse, in terms of pressor requirement.  This goes somewhat against the notion of patient being intravascularly volume-contracted from third-spacing from his pancreatitis.  KUB shows unchanged gaseous distension of bowel (?transverse colon) in upper abd.  Will sign off since realistically this is more critical care management of complications of a GI problem, as opposed to active GI problem management.  However, we would be very happy to see patient again if, at any time, you feel further input from [place] would be helpful.  Cleotis Nipper, M.D. Pager [phone] If no answer or after 5 PM call [phone]

## 2018-11-02 ENCOUNTER — Inpatient Hospital Stay (HOSPITAL_COMMUNITY): Payer: Medicare Other

## 2018-11-02 LAB — COMPREHENSIVE METABOLIC PANEL
ALBUMIN: 2.2 g/dL — AB (ref 3.5–5.0)
ALT: 62 U/L — ABNORMAL HIGH (ref 0–44)
ANION GAP: 14 (ref 5–15)
AST: 49 U/L — ABNORMAL HIGH (ref 15–41)
Alkaline Phosphatase: 81 U/L (ref 38–126)
BILIRUBIN TOTAL: 3.1 mg/dL — AB (ref 0.3–1.2)
BUN: 34 mg/dL — ABNORMAL HIGH (ref 8–23)
CALCIUM: 6.7 mg/dL — AB (ref 8.9–10.3)
CO2: 19 mmol/L — ABNORMAL LOW (ref 22–32)
Chloride: 103 mmol/L (ref 98–111)
Creatinine, Ser: 3.27 mg/dL — ABNORMAL HIGH (ref 0.61–1.24)
GFR calc Af Amer: 21 mL/min — ABNORMAL LOW (ref >60)
GFR, EST NON AFRICAN AMERICAN: 18 mL/min — AB (ref >60)
GLUCOSE: 151 mg/dL — AB (ref 70–99)
POTASSIUM: 5 mmol/L (ref 3.5–5.1)
Sodium: 136 mmol/L (ref 135–145)
TOTAL PROTEIN: 6 g/dL — AB (ref 6.5–8.1)

## 2018-11-02 LAB — CULTURE, RESPIRATORY

## 2018-11-02 LAB — GLUCOSE, CAPILLARY
GLUCOSE-CAPILLARY: 100 mg/dL — AB (ref 70–99)
GLUCOSE-CAPILLARY: 109 mg/dL — AB (ref 70–99)
GLUCOSE-CAPILLARY: 135 mg/dL — AB (ref 70–99)
GLUCOSE-CAPILLARY: 88 mg/dL (ref 70–99)
Glucose-Capillary: 93 mg/dL (ref 70–99)
Glucose-Capillary: 122 mg/dL — ABNORMAL HIGH (ref 70–99)
Glucose-Capillary: 90 mg/dL (ref 70–99)

## 2018-11-02 LAB — CULTURE, RESPIRATORY W GRAM STAIN

## 2018-11-02 LAB — CBC
HEMATOCRIT: 29.9 % — AB (ref 39.0–52.0)
HEMOGLOBIN: 9 g/dL — AB (ref 13.0–17.0)
MCH: 28.1 pg (ref 26.0–34.0)
MCHC: 30.1 g/dL (ref 30.0–36.0)
MCV: 93.4 fL (ref 80.0–100.0)
Platelets: 118 10*3/uL — ABNORMAL LOW (ref 150–400)
RBC: 3.2 MIL/uL — ABNORMAL LOW (ref 4.22–5.81)
RDW: 14.6 % (ref 11.5–15.5)
WBC: 24.9 10*3/uL — AB (ref 4.0–10.5)
nRBC: 0.6 % — ABNORMAL HIGH (ref 0.0–0.2)

## 2018-11-02 LAB — RENAL FUNCTION PANEL
ALBUMIN: 2 g/dL — AB (ref 3.5–5.0)
ANION GAP: 12 (ref 5–15)
BUN: 32 mg/dL — AB (ref 8–23)
CALCIUM: 7.1 mg/dL — AB (ref 8.9–10.3)
CO2: 21 mmol/L — ABNORMAL LOW (ref 22–32)
Chloride: 103 mmol/L (ref 98–111)
Creatinine, Ser: 2.86 mg/dL — ABNORMAL HIGH (ref 0.61–1.24)
GFR calc Af Amer: 25 mL/min — ABNORMAL LOW (ref >60)
GFR calc non Af Amer: 21 mL/min — ABNORMAL LOW (ref >60)
GLUCOSE: 115 mg/dL — AB (ref 70–99)
POTASSIUM: 4.7 mmol/L (ref 3.5–5.1)
Phosphorus: 3.1 mg/dL (ref 2.5–4.6)
SODIUM: 136 mmol/L (ref 135–145)

## 2018-11-02 LAB — MAGNESIUM: MAGNESIUM: 2.8 mg/dL — AB (ref 1.7–2.4)

## 2018-11-02 LAB — PROTIME-INR
INR: 1.39
Prothrombin Time: 16.9 seconds — ABNORMAL HIGH (ref 11.4–15.2)

## 2018-11-02 LAB — PHOSPHORUS: Phosphorus: 4.5 mg/dL (ref 2.5–4.6)

## 2018-11-02 MED ORDER — DEXMEDETOMIDINE HCL IN NACL 400 MCG/100ML IV SOLN
0.0000 ug/kg/h | INTRAVENOUS | Status: DC
  Administered 2018-11-02: 0.8 ug/kg/h via INTRAVENOUS
  Administered 2018-11-03: 0.9 ug/kg/h via INTRAVENOUS
  Administered 2018-11-03: 1 ug/kg/h via INTRAVENOUS
  Administered 2018-11-03: 0.8 ug/kg/h via INTRAVENOUS
  Filled 2018-11-02 (×3): qty 100

## 2018-11-02 MED ORDER — CALCIUM GLUCONATE-NACL 2-0.675 GM/100ML-% IV SOLN
2.0000 g | Freq: Once | INTRAVENOUS | Status: AC
  Administered 2018-11-02: 2000 mg via INTRAVENOUS
  Filled 2018-11-02: qty 100

## 2018-11-02 NOTE — Consult Note (Signed)
            [facility] CM Primary Care Navigator  11/02/2018  Derek Riley [facility] 12-Sep-1951 [id number]   Attempt to see patient at the bedside to identify possible discharge needs but he was transferred to 27M 10 on 10/30/18. Per MD note, patient was transferred to ICU as patient is critically ill and will need pressors, resuscitation and possible initiation of CRRT (continuous renal replacement therapy).  Will attempt tofollow-up andsee patientforfurther [facility]-CM needswhen available and out of ICU.   For additional questions please contact:  Edwena Felty A. Loraina Stauffer, BSN, RN-BC [facility] PRIMARY CARE Navigator Cell: [phone]

## 2018-11-02 NOTE — Procedures (Signed)
Admit: 10/26/2018 LOS: 5  78M AKI from ATN, severe pancreatitis, septic shock  Current CRRT Prescription: Start Date: 10/30/18 Catheter: Temp HD Cath placed 11/22 CCM R IJ BFR: 200 Pre Blood Pump: 400 4K DFR: 1500 4K Replacement Rate: 300 4K Goal UF: Net even Anticoagulation: none Clotting: none   S: Weening NE UOP  < 151m/24h Spiked fever yesterday on Zosyn; Cx data ngtd  O: 11/24 0701 - 11/25 0700 In: 2095.8 [I.V.:1400.6; NG/GT:70; IV Piggyback:555.3] Out: 2145 [Urine:75; Emesis/NG output:725]  Filed Weights   10/31/18 0411 11/01/18 0500 11/02/18 0422  Weight: 89.7 kg 92.1 kg 90.8 kg    Recent Labs  Lab 11/01/18 0450 11/01/18 1523 11/02/18 0428  NA 136 136 136  K 4.4 4.3 5.0  CL 100 101 103  CO2 25 23 19*  GLUCOSE 136* 96 151*  BUN 37* 35* 34*  CREATININE 3.39* 3.32* 3.27*  CALCIUM 6.2* 6.8* 6.7*  PHOS 3.7 3.0 4.5   Recent Labs  Lab 10/31/18 0402 11/01/18 0449 11/02/18 0428  WBC 26.8* 19.9* 24.9*  HGB 11.3* 9.4* 9.0*  HCT 33.8* 30.4* 29.9*  MCV 87.3 91.3 93.4  PLT 142* 114* 118*    Scheduled Meds: . chlorhexidine gluconate (MEDLINE KIT)  15 mL Mouth Rinse BID  . Chlorhexidine Gluconate Cloth  6 each Topical Daily  . fentaNYL (SUBLIMAZE) injection  50 mcg Intravenous Once  . heparin injection (subcutaneous)  5,000 Units Subcutaneous Q8H  . insulin aspart  0-20 Units Subcutaneous Q4H  . mouth rinse  15 mL Mouth Rinse 10 times per day  . sodium chloride flush  10-40 mL Intracatheter Q12H   Continuous Infusions: .  prismasol BGK 4/2.5 400 mL/hr at 11/01/18 2346  .  prismasol BGK 4/2.5 300 mL/hr at 11/02/18 0404  . sodium chloride Stopped (11/01/18 0104)  . sodium chloride 10 mL/hr at 11/02/18 0800  . sodium chloride    . dexmedetomidine (PRECEDEX) IV infusion 0.6 mcg/kg/hr (11/02/18 0800)  . famotidine (PEPCID) IV Stopped (11/01/18 1435)  . fentaNYL infusion INTRAVENOUS 225 mcg/hr (11/02/18 0800)  . heparin 999 mL/hr at 10/31/18 0534  .  norepinephrine (LEVOPHED) Adult infusion 3 mcg/min (11/02/18 0800)  . piperacillin-tazobactam (ZOSYN)  IV Stopped (11/02/18 0645)  . prismasol BGK 4/2.5 1,500 mL/hr at 11/02/18 0822   PRN Meds:.Place/Maintain arterial line **AND** sodium chloride, acetaminophen, fentaNYL, heparin, heparin, heparin, HYDROmorphone (DILAUDID) injection, midazolam, midazolam, [DISCONTINUED] ondansetron **OR** ondansetron (ZOFRAN) IV, polyethylene glycol, promethazine, sodium chloride flush  ABG    Component Value Date/Time   PHART 7.433 10/31/2018 0228   PCO2ART 56.4 (H) 10/31/2018 0228   PO2ART 82.0 (L) 10/31/2018 0228   HCO3 37.2 (H) 10/31/2018 0228   TCO2 39 (H) 10/31/2018 0228   ACIDBASEDEF 8.0 (H) 10/30/2018 1356   O2SAT 95.0 10/31/2018 0228    A  1. Dialysis dependent AKI from ATN  2. Severe pancreatitis 3. Septic shock on NE 4. Metabolic Acidosis 5. Hypocalcemia; stable on 2.5Ca bath 6. Anemia 7. VDRF 8. AMS  Cont CRRT, in Pre BP to 802mhr.  COnt net even but with weening NE consider gentle neg UF.    RyPearson GrippeMD [facility] pgr [phone]

## 2018-11-02 NOTE — Consult Note (Signed)
NAME:  Derek Riley, MRN:  [id number], DOB:  March 16, 1951, LOS: 5 ADMISSION DATE:  10/20/2018, CONSULTATION DATE:  11/22 REFERRING MD:  11/22, CHIEF COMPLAINT:  Worsening metabolic acidosis and renal failure w/ clinical decline  Brief History   67 year old male admitted 11/20 w/ gallstone pancreatitis. PCCM consulted 11/22 for progressive metabolic acidosis, acute renal failure and persistent clinical decline.   History of present illness   67 year old white male admitted on 11/20 w/ one day h/o acute abd pain. The pain was progressive and associated w/ nausea and vomiting. He described the pain as gnawing subxiphoid that had been progressive in nature. Lipase was 50341, creatinine 1. CT w/ acute pancreatitis. He was admitted to the medical service. GI and surgical service consulted, underwent MRCP that showed extensive pancreatic, peripancreatic inflammation w/ either proteinaceous or hemorrhagic peripancreatic fluid. There was no choledocholithiasis or findings c/w biliary tract obstruction. Followed by surgery w/ plans for eventual cholecystectomy. He was been treated to date w/ IVFs, empiric abx and supportive care. In spite of this he developed worsening renal failure and metabolic acidosis. PCCM asked to see on 11/22 for continued decline in clinical status, acidosis and renal failure    Past Medical History  Diabetes, gallstones, hypertension.  Significant Hospital Events   11/20 transferred to [facility] from [place] w/ acute gallstone pancreatitis. GI & surgicals services consulted. Started On supportive IVFs, empiric zosyn and analgesic support.  Surgical service is planning on cholecystectomy once pancreatitis improved.  Initial lipase on arrival to [place], creatinine 1.72 11/21: MRCP 11/21 showing extensive pancreatitis, possible hemorrhagic peripancreatic fluid no choledocholithiasis, lipase 3361, creatinine up to 4.91, anion gap 13.  No role for ERCP continued supportive  care 11/22: Creatinine up to 6.95, gap now 16, progressive diffuse mottling, worse pain, critical care consulted for shock, and progressive multiorgan failure, moving to intensive care for CRRT as well as hemodynamic support. 3.7 liters + since admit, developed worsening resp failure requiring intubation shortly after arrival. Possible aspiration during intubation   Consults:  11/20 GI and surgical services 11/22 critical care  Procedures:  11/22 Foley catheter 11/22 right IJ CVL (HD)>>> 11/22 OETT 11/22>>>  Significant Diagnostic Tests:  MRCP 11/21: Acute pancreatitis with extensive pancreatic and periPancreatic inflammatory changes, including proteinaceous and/or hemorrhagic peri-pancreatic fluid.  No choledocholithiasis or biliary tract obstruction.  Small volume of ascites, trace bilateral pleural effusions.  Bilateral basilar volume loss  Micro Data:   Antimicrobials:  Zosyn 11/20  Interim history/subjective:  Weaning vasopressors.  Agitation after 1h SBT. Patient continues to report abdominal pain.  Objective   Blood pressure (!) 94/53, pulse 71, temperature 98.6 F (37 C), temperature source Oral, resp. rate 15, height 6' (1.829 m), weight 90.8 kg, SpO2 98 %.    Vent Mode: PRVC FiO2 (%):  [40 %] 40 % Set Rate:  [16 bmp] 16 bmp Vt Set:  [620 mL] 620 mL PEEP:  [5 cmH20] 5 cmH20 Pressure Support:  [5 cmH20] 5 cmH20 Plateau Pressure:  [17 cmH20-21 cmH20] 21 cmH20   Intake/Output Summary (Last 24 hours) at 11/02/2018 1713 Last data filed at 11/02/2018 1700 Gross per 24 hour  Intake 1775.22 ml  Output 1847 ml  Net -71.78 ml   Filed Weights   10/31/18 0411 11/01/18 0500 11/02/18 0422  Weight: 89.7 kg 92.1 kg 90.8 kg    Examination: General: Elderly male appears calm on ventilator  HENT: Dry mucosa, ETT in place  Lungs: Diminished breath sounds in the  bases, on vent, adequate Vt on PS Cardiovascular: RRR no MRG  Abdomen: Distended, hypoactive bowel sounds, OGT,  tender to palpation.  Extremities: Warm, no edema pulses palpable  Neuro: Sedated, calm, follows commands  GU: Foley in place  Resolved Hospital Problem list     Assessment & Plan:   Acute gallstone pancreatitis, currently no evidence of choledocholithiasis from MRCP, however imaging does suggest progressive pancreatic and peripancreatic fluid +/- hemorrhage.  Plan Continue n.p.o. Status Trend lipase Treat pain Eventually will need cholecystectomy   Acute hypoxic respiratory failure. Developed worsening respiratory distress shortly after arrival to ICU.  C/b possible aspiration event  -CXR c/w RLL aspiration  Plan SBT in am, potential extubation. Tr  CXR  VAP bundle  Cont abx for asp coverage  Distributive shock; meets SIRS criteria from pancreatitis w/ clinical decline raising concern for sepsis/septic shock  Plan Now resolved - continue to wean pressores.  AKI -baseline Cr 1. Suspect primarily hemodynamically mediated +/- contrast induced nephropathy Plan Cont CRRT  Serial chemistries.   Fluid and electrolyte imbalance: hyperkalemia, hypocalcemia, hypomagnesium  Plan Replace as indicated.  Serial chemistries  Leukocytosis  -improving Plan Trend cbc  Elevated LFTs in setting of colicystitis (in [place] imaging showed gallstones and CBD stones) these have seemingly passed as were neg on MRCP LFTs trending down, lipase tr down  Plan Intermittent LFTs and lipase   DM w/ hyperglycemia Plan ssi protocol.    Anemia  Plan Trend cbc  Best practice:  Diet: NPO Pain/Anxiety/Delirium protocol (if indicated): fent /precedex  VAP protocol (if indicated):  DVT prophylaxis: change from LMWH to [facility] heparin 11/22 GI prophylaxis: PPI (active reflux) Glucose control: ssi Mobility: br Code Status: full code Family Communication: pending Disposition: ICU  Labs   CBC: Recent Labs  Lab 10/30/18 0606 10/30/18 1817 10/31/18 0402 11/01/18 0449 11/02/18 0428  WBC  19.8* 29.1* 26.8* 19.9* 24.9*  HGB 12.3* 11.5* 11.3* 9.4* 9.0*  HCT 38.6* 34.8* 33.8* 30.4* 29.9*  MCV 91.7 89.2 87.3 91.3 93.4  PLT 147* 149* 142* 114* 118*    Basic Metabolic Panel: Recent Labs  Lab 10/31/18 0402 10/31/18 1559 11/01/18 0449 11/01/18 0450 11/01/18 1523 11/02/18 0428 11/02/18 1612  NA 139 136 135 136 136 136 136  K 3.5 4.0 4.4 4.4 4.3 5.0 4.7  CL 90* 95* 100 100 101 103 103  CO2 33* 29 25 25 23  19* 21*  GLUCOSE 136* 162* 135* 136* 96 151* 115*  BUN 59* 45* 37* 37* 35* 34* 32*  CREATININE 5.22* 3.92* 3.41* 3.39* 3.32* 3.27* 2.86*  CALCIUM 5.5* 5.8* 6.2* 6.2* 6.8* 6.7* 7.1*  MG 1.6*  --  2.5*  --   --  2.8*  --   PHOS 2.8 4.1  --  3.7 3.0 4.5 3.1   GFR: Estimated Creatinine Clearance: 27.5 mL/min (A) (by C-G formula based on SCr of 2.86 mg/dL (H)). Recent Labs  Lab 10/30/18 1542 10/30/18 1817 10/31/18 0402 11/01/18 0449 11/01/18 0450 11/02/18 0428  WBC  --  29.1* 26.8* 19.9*  --  24.9*  LATICACIDVEN 1.8  --  2.4*  --  1.6  --     Liver Function Tests: Recent Labs  Lab 10/29/18 1125 10/30/18 0331  10/31/18 0402  11/01/18 0449 11/01/18 0450 11/01/18 1523 11/02/18 0428 11/02/18 1612  AST 399* 207*  --  84*  --  55*  --   --  49*  --   ALT 374* 260*  --  147*  --  90*  --   --  62*  --   ALKPHOS 50 48  --  72  --  74  --   --  81  --   BILITOT 6.3* 4.0*  --  2.3*  --  2.5*  --   --  3.1*  --   PROT 6.4* 5.9*  --  5.7*  --  5.6*  --   --  6.0*  --   ALBUMIN 2.9* 2.3*   < > 2.0*   < > 1.9* 1.9* 2.5* 2.2* 2.0*   < > = values in this interval not displayed.   Recent Labs  Lab 10/26/2018 1524 10/29/18 1125 10/31/18 0402 11/01/18 0449  LIPASE 3,700* 3,361* 304* 81*   No results for input(s): AMMONIA in the last 168 hours.  ABG    Component Value Date/Time   PHART 7.433 10/31/2018 0228   PCO2ART 56.4 (H) 10/31/2018 0228   PO2ART 82.0 (L) 10/31/2018 0228   HCO3 37.2 (H) 10/31/2018 0228   TCO2 39 (H) 10/31/2018 0228   ACIDBASEDEF 8.0 (H)  10/30/2018 1356   O2SAT 95.0 10/31/2018 0228     Coagulation Profile: Recent Labs  Lab 11/01/18 0014 11/02/18 0942  INR 1.38 1.39    Cardiac Enzymes: No results for input(s): CKTOTAL, CKMB, CKMBINDEX, TROPONINI in the last 168 hours.  HbA1C: Hgb A1c MFr Bld  Date/Time Value Ref Range Status  10/17/2018 11:23 PM 8.0 (H) 4.8 - 5.6 % Final    Comment:    (NOTE) Pre diabetes:          5.7%-6.4% Diabetes:              >6.4% Glycemic control for   <7.0% adults with diabetes     CBG: Recent Labs  Lab 11/01/18 2353 11/02/18 0405 11/02/18 0729 11/02/18 1112 11/02/18 1539  GLUCAP 100* 135* 109* 88 93     Past Medical History  He,  has a past medical history of Diabetes mellitus without complication ([place]), Gallstone (10/2018), Hyperlipidemia, and Hypertension.    Critical care time:  58 min     Kipp Brood, MD [facility] ICU Physician [place]  Pager: [phone] Mobile: [phone] After hours: [phone].  11/02/2018

## 2018-11-02 NOTE — Progress Notes (Signed)
Pt weaned for 1 hour on 5/5 before becoming tachypneic and desaturating into the 80's. RT placed back on full support, VSS at this time. Will continue to monitor.

## 2018-11-03 ENCOUNTER — Inpatient Hospital Stay (HOSPITAL_COMMUNITY): Payer: Medicare Other

## 2018-11-03 DIAGNOSIS — R579 Shock, unspecified: Secondary | ICD-10-CM

## 2018-11-03 DIAGNOSIS — K851 Biliary acute pancreatitis without necrosis or infection: Principal | ICD-10-CM

## 2018-11-03 LAB — RENAL FUNCTION PANEL
ALBUMIN: 1.9 g/dL — AB (ref 3.5–5.0)
ANION GAP: 11 (ref 5–15)
ANION GAP: 14 (ref 5–15)
Albumin: 1.9 g/dL — ABNORMAL LOW (ref 3.5–5.0)
BUN: 32 mg/dL — ABNORMAL HIGH (ref 8–23)
BUN: 29 mg/dL — AB (ref 8–23)
CO2: 21 mmol/L — AB (ref 22–32)
CO2: 19 mmol/L — ABNORMAL LOW (ref 22–32)
CREATININE: 2.82 mg/dL — AB (ref 0.61–1.24)
Calcium: 6.9 mg/dL — ABNORMAL LOW (ref 8.9–10.3)
Calcium: 7.2 mg/dL — ABNORMAL LOW (ref 8.9–10.3)
Chloride: 104 mmol/L (ref 98–111)
Chloride: 103 mmol/L (ref 98–111)
Creatinine, Ser: 2.58 mg/dL — ABNORMAL HIGH (ref 0.61–1.24)
GFR calc Af Amer: 29 mL/min — ABNORMAL LOW (ref >60)
GFR calc non Af Amer: 22 mL/min — ABNORMAL LOW (ref >60)
GFR calc non Af Amer: 25 mL/min — ABNORMAL LOW (ref >60)
GFR, EST AFRICAN AMERICAN: 25 mL/min — AB (ref >60)
GLUCOSE: 118 mg/dL — AB (ref 70–99)
Glucose, Bld: 125 mg/dL — ABNORMAL HIGH (ref 70–99)
PHOSPHORUS: 3.1 mg/dL (ref 2.5–4.6)
POTASSIUM: 5 mmol/L (ref 3.5–5.1)
Phosphorus: 3 mg/dL (ref 2.5–4.6)
Potassium: 4.8 mmol/L (ref 3.5–5.1)
SODIUM: 136 mmol/L (ref 135–145)
SODIUM: 136 mmol/L (ref 135–145)

## 2018-11-03 LAB — CBC
HCT: 27.7 % — ABNORMAL LOW (ref 39.0–52.0)
Hemoglobin: 8.3 g/dL — ABNORMAL LOW (ref 13.0–17.0)
MCH: 28 pg (ref 26.0–34.0)
MCHC: 30 g/dL (ref 30.0–36.0)
MCV: 93.6 fL (ref 80.0–100.0)
PLATELETS: 113 10*3/uL — AB (ref 150–400)
RBC: 2.96 MIL/uL — AB (ref 4.22–5.81)
RDW: 14.7 % (ref 11.5–15.5)
WBC: 24.8 10*3/uL — ABNORMAL HIGH (ref 4.0–10.5)
nRBC: 0.8 % — ABNORMAL HIGH (ref 0.0–0.2)

## 2018-11-03 LAB — GLUCOSE, CAPILLARY
GLUCOSE-CAPILLARY: 109 mg/dL — AB (ref 70–99)
Glucose-Capillary: 101 mg/dL — ABNORMAL HIGH (ref 70–99)
Glucose-Capillary: 103 mg/dL — ABNORMAL HIGH (ref 70–99)
Glucose-Capillary: 106 mg/dL — ABNORMAL HIGH (ref 70–99)
Glucose-Capillary: 113 mg/dL — ABNORMAL HIGH (ref 70–99)
Glucose-Capillary: 142 mg/dL — ABNORMAL HIGH (ref 70–99)

## 2018-11-03 LAB — PROCALCITONIN: Procalcitonin: 7.86 ng/mL

## 2018-11-03 LAB — LIPASE, BLOOD: Lipase: 43 U/L (ref 11–51)

## 2018-11-03 LAB — MAGNESIUM: Magnesium: 2.7 mg/dL — ABNORMAL HIGH (ref 1.7–2.4)

## 2018-11-03 MED ORDER — CLONAZEPAM 0.5 MG PO TBDP
0.5000 mg | ORAL_TABLET | Freq: Two times a day (BID) | ORAL | Status: DC
  Administered 2018-11-03 (×2): 0.5 mg via ORAL
  Filled 2018-11-03 (×2): qty 1

## 2018-11-03 MED ORDER — SODIUM CHLORIDE 0.9 % IV SOLN
1.0000 g | Freq: Two times a day (BID) | INTRAVENOUS | Status: DC
  Administered 2018-11-03 – 2018-11-04 (×3): 1 g via INTRAVENOUS
  Filled 2018-11-03 (×3): qty 1

## 2018-11-03 MED ORDER — QUETIAPINE FUMARATE 50 MG PO TABS
50.0000 mg | ORAL_TABLET | Freq: Every day | ORAL | Status: DC
  Administered 2018-11-03 – 2018-11-04 (×2): 50 mg via ORAL
  Filled 2018-11-03 (×3): qty 1

## 2018-11-03 MED ORDER — ORAL CARE MOUTH RINSE
15.0000 mL | Freq: Two times a day (BID) | OROMUCOSAL | Status: DC
  Administered 2018-11-03 – 2018-11-05 (×4): 15 mL via OROMUCOSAL

## 2018-11-03 MED ORDER — HYDROMORPHONE HCL 1 MG/ML IJ SOLN
0.5000 mg | INTRAMUSCULAR | Status: DC | PRN
  Administered 2018-11-03 – 2018-11-05 (×9): 1 mg via INTRAVENOUS
  Filled 2018-11-03 (×3): qty 1
  Filled 2018-11-03: qty 0.5
  Filled 2018-11-03 (×4): qty 1
  Filled 2018-11-03: qty 0.5
  Filled 2018-11-03: qty 1

## 2018-11-03 NOTE — Progress Notes (Signed)
unable to successfully  place NG tube; NP Paula at bedside, she will try  In 30 minutes to 1 hour

## 2018-11-03 NOTE — Progress Notes (Signed)
220 mL fentanyl wasted in sink witnessed by [employer]

## 2018-11-03 NOTE — Progress Notes (Signed)
[place] Progress Note Patient Name: DERRON PIPKINS DOB: 1951/08/07 MRN: [id number]   Date of Service  11/03/2018  HPI/Events of Note  Vomiting - Request for NGT.  eICU Interventions  Will order: 1. NGT to LIS.     Intervention Category Major Interventions: Other:  Lysle Dingwall 11/03/2018, 11:16 PM

## 2018-11-03 NOTE — Procedures (Signed)
Admit: 11/02/2018 LOS: 6  17M AKI from ATN, severe pancreatitis, septic shock  Current CRRT Prescription: Start Date: 10/30/18 Catheter: Temp HD Cath placed 11/22 CCM R IJ BFR: 200 Pre Blood Pump: 800 4K DFR: 1500 4K Replacement Rate: 300 4K Goal UF: Net even Anticoagulation: none Clotting: none   S: Remains on intermittent low dose NE No sig UOP On PSV  O: 11/25 0701 - 11/26 0700 In: 1707.5 [I.V.:1307.3; NG/GT:50; IV Piggyback:350.2] Out: 1880 [Urine:10; Emesis/NG output:775]  Filed Weights   11/01/18 0500 11/02/18 0422 11/03/18 0345  Weight: 92.1 kg 90.8 kg 90.3 kg    Recent Labs  Lab 11/02/18 0428 11/02/18 1612 11/03/18 0355  NA 136 136 136  K 5.0 4.7 4.8  CL 103 103 104  CO2 19* 21* 21*  GLUCOSE 151* 115* 118*  BUN 34* 32* 32*  CREATININE 3.27* 2.86* 2.82*  CALCIUM 6.7* 7.1* 6.9*  PHOS 4.5 3.1 3.0   Recent Labs  Lab 11/01/18 0449 11/02/18 0428 11/03/18 0355  WBC 19.9* 24.9* 24.8*  HGB 9.4* 9.0* 8.3*  HCT 30.4* 29.9* 27.7*  MCV 91.3 93.4 93.6  PLT 114* 118* 113*    Scheduled Meds: . chlorhexidine gluconate (MEDLINE KIT)  15 mL Mouth Rinse BID  . Chlorhexidine Gluconate Cloth  6 each Topical Daily  . heparin injection (subcutaneous)  5,000 Units Subcutaneous Q8H  . insulin aspart  0-20 Units Subcutaneous Q4H  . mouth rinse  15 mL Mouth Rinse 10 times per day  . sodium chloride flush  10-40 mL Intracatheter Q12H   Continuous Infusions: .  prismasol BGK 4/2.5 800 mL/hr at 11/03/18 0528  .  prismasol BGK 4/2.5 300 mL/hr at 11/02/18 2047  . sodium chloride Stopped (11/01/18 0104)  . sodium chloride 10 mL/hr at 11/03/18 0800  . sodium chloride    . dexmedetomidine (PRECEDEX) IV infusion 1 mcg/kg/hr (11/03/18 0800)  . famotidine (PEPCID) IV Stopped (11/02/18 1540)  . fentaNYL infusion INTRAVENOUS 175 mcg/hr (11/03/18 0800)  . heparin 999 mL/hr at 10/31/18 0534  . norepinephrine (LEVOPHED) Adult infusion Stopped (11/03/18 0755)  .  piperacillin-tazobactam (ZOSYN)  IV Stopped (11/03/18 0551)  . prismasol BGK 4/2.5 1,500 mL/hr at 11/03/18 0502   PRN Meds:.Place/Maintain arterial line **AND** sodium chloride, acetaminophen, fentaNYL, heparin, heparin, heparin, HYDROmorphone (DILAUDID) injection, midazolam, midazolam, [DISCONTINUED] ondansetron **OR** ondansetron (ZOFRAN) IV, polyethylene glycol, promethazine, sodium chloride flush  ABG    Component Value Date/Time   PHART 7.433 10/31/2018 0228   PCO2ART 56.4 (H) 10/31/2018 0228   PO2ART 82.0 (L) 10/31/2018 0228   HCO3 37.2 (H) 10/31/2018 0228   TCO2 39 (H) 10/31/2018 0228   ACIDBASEDEF 8.0 (H) 10/30/2018 1356   O2SAT 95.0 10/31/2018 0228    A  1. Dialysis dependent AKI from ATN  2. Severe pancreatitis 3. Septic shock on NE 4. Metabolic Acidosis 5. Hypocalcemia; stable on 2.5Ca bath 6. Anemia 7. VDRF 8. AMS  Cont CRRT at current settings.  COnt net even but with weening NE consider gentle neg UF.  Not ready to transition to intermittent hemodialysis  Pearson Grippe, MD [facility] pgr [phone]

## 2018-11-03 NOTE — Procedures (Signed)
Extubation Procedure Note  Patient Details:   Name: Derek Riley DOB: 10/18/1951 MRN: [id number]   Airway Documentation:   Patient extubated per orders, patient had positive cuff leak prior to extubation. Placed on 6 lpm of humidified oxygen. RN at bedside Vent end date: 11/03/18 Vent end time: 1235   Evaluation  O2 sats: stable throughout Complications: No apparent complications Patient did tolerate procedure well. Bilateral Breath Sounds: Clear, Diminished   Yes  Ander Purpura 11/03/2018, 12:36 PM

## 2018-11-03 NOTE — Progress Notes (Signed)
Pharmacy Antibiotic Note  Derek Riley is a 67 y.o. male admitted on 10/17/2018 with intraabdominal infection.  Pharmacy has been consulted for meropenem dosing. Patient afebrile, but WBC has remained elevated 20-26 throughout admission, despite 5 days of Zosyn. Additionally, patient had AKI and has been on CRRT since 11/22.  Plan: After discussion with Dr. Nelda Marseille, antibiotics will be changed from Zosyn to meropenem.  Height: 6' (182.9 cm) Weight: 199 lb 1.2 oz (90.3 kg) IBW/kg (Calculated) : 77.6  Temp (24hrs), Avg:99 F (37.2 C), Min:98.3 F (36.8 C), Max:99.4 F (37.4 C)  Recent Labs  Lab 10/30/18 1542  10/30/18 1817 10/31/18 0402  11/01/18 0449 11/01/18 0450 11/01/18 1523 11/02/18 0428 11/02/18 1612 11/03/18 0355  WBC  --   --  29.1* 26.8*  --  19.9*  --   --  24.9*  --  24.8*  CREATININE  --    < >  --  5.22*   < > 3.41* 3.39* 3.32* 3.27* 2.86* 2.82*  LATICACIDVEN 1.8  --   --  2.4*  --   --  1.6  --   --   --   --    < > = values in this interval not displayed.    Estimated Creatinine Clearance: 27.9 mL/min (A) (by C-G formula based on SCr of 2.82 mg/dL (H)).    No Known Allergies  Antimicrobials this admission: Piperacillin-tazobactam 11/20 >> 11/26  Dose adjustments this admission: Discontinue Zosyn. Start meropenem 1g IV q12h. Follow CRRT, renal function, LFT, CBC, WBC, & temp.   Microbiology results: 11/23 BCx: no growth 3 days 11/23 tracheal aspirate: yeast - not treating  11/22 MRSA PCR: negative  Thank you for allowing pharmacy to be a part of this patient's care.  Evalyn Casco 11/03/2018 11:33 AM

## 2018-11-03 NOTE — Progress Notes (Signed)
Perfuse vomiting , RN was able to capture 300 cc. Antiemesis given: contact elink requested NG tube order

## 2018-11-03 NOTE — Consult Note (Addendum)
NAME:  Derek Riley, MRN:  [id number], DOB:  Apr 08, 1951, LOS: 6 ADMISSION DATE:  11/02/2018, CONSULTATION DATE:  11/22 REFERRING MD:  11/22, CHIEF COMPLAINT:  Worsening metabolic acidosis and renal failure w/ clinical decline  Brief History   67 year old male admitted 11/20 w/ gallstone pancreatitis. PCCM consulted 11/22 for progressive metabolic acidosis, acute renal failure and persistent clinical decline.   History of present illness   67 year old white male admitted on 11/20 w/ one day h/o acute abd pain. The pain was progressive and associated w/ nausea and vomiting. He described the pain as gnawing subxiphoid that had been progressive in nature. Lipase was 50341, creatinine 1. CT w/ acute pancreatitis. He was admitted to the medical service. GI and surgical service consulted, underwent MRCP that showed extensive pancreatic, peripancreatic inflammation w/ either proteinaceous or hemorrhagic peripancreatic fluid. There was no choledocholithiasis or findings c/w biliary tract obstruction. Followed by surgery w/ plans for eventual cholecystectomy. He was been treated to date w/ IVFs, empiric abx and supportive care. In spite of this he developed worsening renal failure and metabolic acidosis. PCCM asked to see on 11/22 for continued decline in clinical status, acidosis and renal failure    Past Medical History  Diabetes, gallstones, hypertension.  Significant Hospital Events   11/20 transferred to [facility] from [place] w/ acute gallstone pancreatitis. GI & surgicals services consulted. Started On supportive IVFs, empiric zosyn and analgesic support.  Surgical service is planning on cholecystectomy once pancreatitis improved.  Initial lipase on arrival to [place], creatinine 1.72 11/21: MRCP 11/21 showing extensive pancreatitis, possible hemorrhagic peripancreatic fluid no choledocholithiasis, lipase 3361, creatinine up to 4.91, anion gap 13.  No role for ERCP continued supportive  care 11/22: Creatinine up to 6.95, gap now 16, progressive diffuse mottling, worse pain, critical care consulted for shock, and progressive multiorgan failure, moving to intensive care for CRRT as well as hemodynamic support. 3.7 liters + since admit, developed worsening resp failure requiring intubation shortly after arrival. Possible aspiration during intubation   Consults:  11/20 GI and surgical services 11/22 critical care  Procedures:  11/22 Foley catheter 11/22 right IJ CVL (HD)>>> 11/22 OETT 11/22>>>  Significant Diagnostic Tests:  MRCP 11/21: Acute pancreatitis with extensive pancreatic and periPancreatic inflammatory changes, including proteinaceous and/or hemorrhagic peri-pancreatic fluid.  No choledocholithiasis or biliary tract obstruction.  Small volume of ascites, trace bilateral pleural effusions.  Bilateral basilar volume loss  Micro Data:   Antimicrobials:  Zosyn 11/20  Interim history/subjective:  Weaning vasopressors and continuing vent support. Patient is able to open eyes to verbal command.  Objective   Blood pressure (!) 109/55, pulse 77, temperature 99.1 F (37.3 C), temperature source Axillary, resp. rate 14, height 6' (1.829 m), weight 90.3 kg, SpO2 100 %.    Vent Mode: PSV;CPAP FiO2 (%):  [40 %] 40 % Set Rate:  [16 bmp] 16 bmp Vt Set:  [620 mL] 620 mL PEEP:  [5 cmH20] 5 cmH20 Pressure Support:  [5 cmH20-10 cmH20] 10 cmH20 Plateau Pressure:  [20 OXB35-32 cmH20] 20 cmH20   Intake/Output Summary (Last 24 hours) at 11/03/2018 1012 Last data filed at 11/03/2018 0900 Gross per 24 hour  Intake 1636.96 ml  Output 1799 ml  Net -162.04 ml   Filed Weights   11/01/18 0500 11/02/18 0422 11/03/18 0345  Weight: 92.1 kg 90.8 kg 90.3 kg    Examination: General: Ill appearing, appears comfortable HENT: Dry mucosa, ETT in place Lungs: CTAB, no wheezing or rhonchi, diminished  breath sounds bilat Cardiovascular: RRR, no murmurs Abdomen: soft, obese,  non-distended, non-tender, +bowel sounds Extremities: Warm, palpable pulses, no edema or cyanosis Neuro: sedated, follows commands GU: foley in place  Resolved Hospital Problem list     Assessment & Plan:   Acute gallstone pancreatitis, currently no evidence of choledocholithiasis from MRCP, however imaging does suggest progressive pancreatic and peripancreatic fluid +/- hemorrhage.  Plan Continue n.p.o. Status Trend lipase Treat pain Eventually will need cholecystectomy  Acute hypoxic respiratory failure. Developed worsening respiratory distress shortly after arrival to ICU.  C/b possible aspiration event  -CXR c/w RLL aspiration  Plan SBT in am, potential extubation. Tr  CXR  VAP bundle  Cont abx for asp coverage  Distributive shock; meets SIRS criteria from pancreatitis w/ clinical decline raising concern for sepsis/septic shock  Plan Now resolved - continue to wean pressores.  AKI -baseline Cr 1. Suspect primarily hemodynamically mediated +/- contrast induced nephropathy. Slowly improving. Plan Cont CRRT  Serial chemistries.   Fluid and electrolyte imbalance: hyperkalemia, hypocalcemia, hypomagnesium  Plan Replace as indicated.  Serial chemistries  Leukocytosis  -improving Plan Trend cbc  Elevated LFTs in setting of colicystitis (in [place] imaging showed gallstones and CBD stones) these have seemingly passed as were neg on MRCP LFTs trending down, lipase tr down  Plan Intermittent LFTs and lipase   DM w/ hyperglycemia Plan ssi protocol.    Anemia  Plan Trend cbc  Best practice:  Diet: NPO Pain/Anxiety/Delirium protocol (if indicated): fent /precedex  VAP protocol (if indicated):  DVT prophylaxis: change from LMWH to [facility] heparin 11/22 GI prophylaxis: PPI (active reflux) Glucose control: ssi Mobility: br Code Status: full code Family Communication: pending Disposition: ICU  Labs   CBC: Recent Labs  Lab 10/30/18 1817 10/31/18 0402  11/01/18 0449 11/02/18 0428 11/03/18 0355  WBC 29.1* 26.8* 19.9* 24.9* 24.8*  HGB 11.5* 11.3* 9.4* 9.0* 8.3*  HCT 34.8* 33.8* 30.4* 29.9* 27.7*  MCV 89.2 87.3 91.3 93.4 93.6  PLT 149* 142* 114* 118* 113*    Basic Metabolic Panel: Recent Labs  Lab 10/31/18 0402  11/01/18 0449 11/01/18 0450 11/01/18 1523 11/02/18 0428 11/02/18 1612 11/03/18 0355  NA 139   < > 135 136 136 136 136 136  K 3.5   < > 4.4 4.4 4.3 5.0 4.7 4.8  CL 90*   < > 100 100 101 103 103 104  CO2 33*   < > 25 25 23  19* 21* 21*  GLUCOSE 136*   < > 135* 136* 96 151* 115* 118*  BUN 59*   < > 37* 37* 35* 34* 32* 32*  CREATININE 5.22*   < > 3.41* 3.39* 3.32* 3.27* 2.86* 2.82*  CALCIUM 5.5*   < > 6.2* 6.2* 6.8* 6.7* 7.1* 6.9*  MG 1.6*  --  2.5*  --   --  2.8*  --  2.7*  PHOS 2.8   < >  --  3.7 3.0 4.5 3.1 3.0   < > = values in this interval not displayed.   GFR: Estimated Creatinine Clearance: 27.9 mL/min (A) (by C-G formula based on SCr of 2.82 mg/dL (H)). Recent Labs  Lab 10/30/18 1542  10/31/18 0402 11/01/18 0449 11/01/18 0450 11/02/18 0428 11/03/18 0355  WBC  --    < > 26.8* 19.9*  --  24.9* 24.8*  LATICACIDVEN 1.8  --  2.4*  --  1.6  --   --    < > = values in this interval not displayed.  Liver Function Tests: Recent Labs  Lab 10/29/18 1125 10/30/18 0331  10/31/18 0402  11/01/18 0449 11/01/18 0450 11/01/18 1523 11/02/18 0428 11/02/18 1612 11/03/18 0355  AST 399* 207*  --  84*  --  55*  --   --  49*  --   --   ALT 374* 260*  --  147*  --  90*  --   --  62*  --   --   ALKPHOS 50 48  --  72  --  74  --   --  81  --   --   BILITOT 6.3* 4.0*  --  2.3*  --  2.5*  --   --  3.1*  --   --   PROT 6.4* 5.9*  --  5.7*  --  5.6*  --   --  6.0*  --   --   ALBUMIN 2.9* 2.3*   < > 2.0*   < > 1.9* 1.9* 2.5* 2.2* 2.0* 1.9*   < > = values in this interval not displayed.   Recent Labs  Lab 11/06/2018 1524 10/29/18 1125 10/31/18 0402 11/01/18 0449  LIPASE 3,700* 3,361* 304* 81*   No results for  input(s): AMMONIA in the last 168 hours.  ABG    Component Value Date/Time   PHART 7.433 10/31/2018 0228   PCO2ART 56.4 (H) 10/31/2018 0228   PO2ART 82.0 (L) 10/31/2018 0228   HCO3 37.2 (H) 10/31/2018 0228   TCO2 39 (H) 10/31/2018 0228   ACIDBASEDEF 8.0 (H) 10/30/2018 1356   O2SAT 95.0 10/31/2018 0228     Coagulation Profile: Recent Labs  Lab 11/01/18 0014 11/02/18 0942  INR 1.38 1.39    Cardiac Enzymes: No results for input(s): CKTOTAL, CKMB, CKMBINDEX, TROPONINI in the last 168 hours.  HbA1C: Hgb A1c MFr Bld  Date/Time Value Ref Range Status  10/09/2018 11:23 PM 8.0 (H) 4.8 - 5.6 % Final    Comment:    (NOTE) Pre diabetes:          5.7%-6.4% Diabetes:              >6.4% Glycemic control for   <7.0% adults with diabetes     CBG: Recent Labs  Lab 11/02/18 1539 11/02/18 1944 11/02/18 2317 11/03/18 0353 11/03/18 0735  GLUCAP 93 122* 90 109* 103*     Past Medical History  He,  has a past medical history of Diabetes mellitus without complication ([place]), Gallstone (10/2018), Hyperlipidemia, and Hypertension.    Critical care time:  32 min    Harolyn Rutherford, [place], PGY-2  11/03/2018   Attending Note:  67 year old male with gal stone pancreatitis who presents with ARDS and respiratory failure.  Patient is more interactive but remains very lethargic with coarse BS on exam.  I reviewed CXR myself, pulmonary edema noted with ETT in a good position.  Discussed with PCCM-resident and pharmacy.  Will d/c all sedation.  Allow patient to wake up and extubate as he is weaning very well this AM.  Restraints to be placed to prevent self extubation.  D/C zosyn.  Add merrem with pharmacy to dose.  Check PCT.  If not extubated, may need to rescan his abdomen checking for pseudocyst.  PCCM will continue to manage.  The patient is critically ill with multiple organ systems failure and requires high complexity decision making for assessment and support,  frequent evaluation and titration of therapies, application of advanced monitoring technologies and extensive interpretation of multiple  databases.   Critical Care Time devoted to patient care services described in this note is  33  Minutes. This time reflects time of care of this signee Dr Jennet Maduro. This critical care time does not reflect procedure time, or teaching time or supervisory time of PA/NP/Med student/Med Resident etc but could involve care discussion time.  Rush Farmer, M.D. [facility]. Pager: [phone]. After hours pager: [phone].

## 2018-11-04 ENCOUNTER — Inpatient Hospital Stay (HOSPITAL_COMMUNITY): Payer: Medicare Other

## 2018-11-04 LAB — GLUCOSE, CAPILLARY
GLUCOSE-CAPILLARY: 133 mg/dL — AB (ref 70–99)
GLUCOSE-CAPILLARY: 118 mg/dL — AB (ref 70–99)
GLUCOSE-CAPILLARY: 131 mg/dL — AB (ref 70–99)
GLUCOSE-CAPILLARY: 150 mg/dL — AB (ref 70–99)
Glucose-Capillary: 140 mg/dL — ABNORMAL HIGH (ref 70–99)
Glucose-Capillary: 152 mg/dL — ABNORMAL HIGH (ref 70–99)

## 2018-11-04 LAB — HEPATIC FUNCTION PANEL
ALK PHOS: 119 U/L (ref 38–126)
ALT: 41 U/L (ref 0–44)
AST: 48 U/L — ABNORMAL HIGH (ref 15–41)
Albumin: 1.7 g/dL — ABNORMAL LOW (ref 3.5–5.0)
BILIRUBIN TOTAL: 4.1 mg/dL — AB (ref 0.3–1.2)
Bilirubin, Direct: 2.1 mg/dL — ABNORMAL HIGH (ref 0.0–0.2)
Indirect Bilirubin: 2 mg/dL — ABNORMAL HIGH (ref 0.3–0.9)
TOTAL PROTEIN: 5.3 g/dL — AB (ref 6.5–8.1)

## 2018-11-04 LAB — RENAL FUNCTION PANEL
ALBUMIN: 1.7 g/dL — AB (ref 3.5–5.0)
ANION GAP: 15 (ref 5–15)
Albumin: 1.6 g/dL — ABNORMAL LOW (ref 3.5–5.0)
Anion gap: 14 (ref 5–15)
BUN: 33 mg/dL — AB (ref 8–23)
BUN: 40 mg/dL — ABNORMAL HIGH (ref 8–23)
CALCIUM: 7 mg/dL — AB (ref 8.9–10.3)
CHLORIDE: 102 mmol/L (ref 98–111)
CO2: 19 mmol/L — AB (ref 22–32)
CO2: 18 mmol/L — ABNORMAL LOW (ref 22–32)
Calcium: 7.1 mg/dL — ABNORMAL LOW (ref 8.9–10.3)
Chloride: 102 mmol/L (ref 98–111)
Creatinine, Ser: 2.55 mg/dL — ABNORMAL HIGH (ref 0.61–1.24)
Creatinine, Ser: 3.03 mg/dL — ABNORMAL HIGH (ref 0.61–1.24)
GFR calc Af Amer: 29 mL/min — ABNORMAL LOW (ref >60)
GFR calc non Af Amer: 25 mL/min — ABNORMAL LOW (ref >60)
GFR, EST AFRICAN AMERICAN: 24 mL/min — AB (ref >60)
GFR, EST NON AFRICAN AMERICAN: 20 mL/min — AB (ref >60)
GLUCOSE: 155 mg/dL — AB (ref 70–99)
Glucose, Bld: 140 mg/dL — ABNORMAL HIGH (ref 70–99)
PHOSPHORUS: 3.6 mg/dL (ref 2.5–4.6)
POTASSIUM: 4.6 mmol/L (ref 3.5–5.1)
POTASSIUM: 4.7 mmol/L (ref 3.5–5.1)
Phosphorus: 3.3 mg/dL (ref 2.5–4.6)
SODIUM: 134 mmol/L — AB (ref 135–145)
Sodium: 136 mmol/L (ref 135–145)

## 2018-11-04 LAB — POCT I-STAT 3, ART BLOOD GAS (G3+)
ACID-BASE DEFICIT: 5 mmol/L — AB (ref 0.0–2.0)
Acid-base deficit: 5 mmol/L — ABNORMAL HIGH (ref 0.0–2.0)
BICARBONATE: 18.6 mmol/L — AB (ref 20.0–28.0)
Bicarbonate: 18.7 mmol/L — ABNORMAL LOW (ref 20.0–28.0)
O2 Saturation: 93 %
O2 Saturation: 95 %
PH ART: 7.416 (ref 7.350–7.450)
PH ART: 7.432 (ref 7.350–7.450)
TCO2: 19 mmol/L — ABNORMAL LOW (ref 22–32)
TCO2: 20 mmol/L — ABNORMAL LOW (ref 22–32)
pCO2 arterial: 28.8 mmHg — ABNORMAL LOW (ref 32.0–48.0)
pCO2 arterial: 28 mmHg — ABNORMAL LOW (ref 32.0–48.0)
pO2, Arterial: 64 mmHg — ABNORMAL LOW (ref 83.0–108.0)
pO2, Arterial: 71 mmHg — ABNORMAL LOW (ref 83.0–108.0)

## 2018-11-04 LAB — CBC
HEMATOCRIT: 26.6 % — AB (ref 39.0–52.0)
Hemoglobin: 8.3 g/dL — ABNORMAL LOW (ref 13.0–17.0)
MCH: 28.5 pg (ref 26.0–34.0)
MCHC: 31.2 g/dL (ref 30.0–36.0)
MCV: 91.4 fL (ref 80.0–100.0)
NRBC: 0.9 % — AB (ref 0.0–0.2)
Platelets: 116 10*3/uL — ABNORMAL LOW (ref 150–400)
RBC: 2.91 MIL/uL — ABNORMAL LOW (ref 4.22–5.81)
RDW: 14.9 % (ref 11.5–15.5)
WBC: 37.4 10*3/uL — AB (ref 4.0–10.5)

## 2018-11-04 LAB — MAGNESIUM: Magnesium: 2.7 mg/dL — ABNORMAL HIGH (ref 1.7–2.4)

## 2018-11-04 LAB — PROCALCITONIN: Procalcitonin: 5.45 ng/mL

## 2018-11-04 MED ORDER — SODIUM CHLORIDE 0.9 % IV SOLN
500.0000 mg | INTRAVENOUS | Status: DC
  Administered 2018-11-04 – 2018-11-06 (×3): 500 mg via INTRAVENOUS
  Filled 2018-11-04 (×3): qty 0.5

## 2018-11-04 MED ORDER — ONDANSETRON HCL 4 MG/2ML IJ SOLN
4.0000 mg | Freq: Four times a day (QID) | INTRAMUSCULAR | Status: DC | PRN
  Administered 2018-11-04 – 2018-11-24 (×15): 4 mg via INTRAVENOUS
  Filled 2018-11-04 (×16): qty 2

## 2018-11-04 MED ORDER — IOHEXOL 300 MG/ML  SOLN
80.0000 mL | Freq: Once | INTRAMUSCULAR | Status: AC | PRN
  Administered 2018-11-04: 80 mL via INTRAVENOUS

## 2018-11-04 MED ORDER — CLONAZEPAM 0.5 MG PO TBDP: 0.5000 mg | ORAL_TABLET | Freq: Two times a day (BID) | ORAL | Status: DC | PRN

## 2018-11-04 MED ORDER — LIDOCAINE VISCOUS HCL 2 % MT SOLN
15.0000 mL | Freq: Once | OROMUCOSAL | Status: DC
  Filled 2018-11-04: qty 15

## 2018-11-04 NOTE — Progress Notes (Signed)
Noted absent breath sounds on the right on assessment, notified MD and obtained an order for a stat CXR. VSS, no evident respiratory distress at this time. Will continue to monitor.

## 2018-11-04 NOTE — Progress Notes (Signed)
[place] Progress Note Patient Name: Derek Riley DOB: 12-Aug-1951 MRN: [id number]   Date of Service  11/04/2018  HPI/Events of Note  Notified of inadvertent removal of NG and requiring NRB.  Patient appears confused but not in distress. ABG showed moderate hypoxemia without hypercarbia.  eICU Interventions  Able to wean to 6L [place].  NG to be replaced tonight.  CXR after.     Intervention Category Major Interventions: Respiratory failure - evaluation and management Intermediate Interventions: Change in mental status - evaluation and management  Judd Lien 11/04/2018, 8:18 PM

## 2018-11-04 NOTE — Procedures (Signed)
Admit: 10/26/2018 LOS: 7  89M AKI from ATN, severe pancreatitis, septic shock  Current CRRT Prescription: Start Date: 10/30/18 Catheter: Temp HD Cath placed 11/22 CCM R IJ BFR: 200 Pre Blood Pump: 800 4K DFR: 1500 4K Replacement Rate: 300 4K Goal UF: Net even Anticoagulation: none Clotting: none   S: Extubated on  Off pressors No sig UOP  O: 11/26 0701 - 11/27 0700 In: 768 [I.V.:518; IV Piggyback:250] Out: 2993 [Emesis/NG output:870]  Filed Weights   11/02/18 0422 11/03/18 0345 11/04/18 0500  Weight: 90.8 kg 90.3 kg 89.3 kg    Recent Labs  Lab 11/03/18 0355 11/03/18 1622 11/04/18 0445  NA 136 136 136  K 4.8 5.0 4.6  CL 104 103 102  CO2 21* 19* 19*  GLUCOSE 118* 125* 140*  BUN 32* 29* 33*  CREATININE 2.82* 2.58* 2.55*  CALCIUM 6.9* 7.2* 7.1*  PHOS 3.0 3.1 3.6   Recent Labs  Lab 11/01/18 0449 11/02/18 0428 11/03/18 0355  WBC 19.9* 24.9* 24.8*  HGB 9.4* 9.0* 8.3*  HCT 30.4* 29.9* 27.7*  MCV 91.3 93.4 93.6  PLT 114* 118* 113*    Scheduled Meds: . Chlorhexidine Gluconate Cloth  6 each Topical Daily  . clonazepam  0.5 mg Oral BID  . heparin injection (subcutaneous)  5,000 Units Subcutaneous Q8H  . insulin aspart  0-20 Units Subcutaneous Q4H  . mouth rinse  15 mL Mouth Rinse BID  . QUEtiapine  50 mg Oral QHS  . sodium chloride flush  10-40 mL Intracatheter Q12H   Continuous Infusions: .  prismasol BGK 4/2.5 800 mL/hr at 11/04/18 0823  .  prismasol BGK 4/2.5 300 mL/hr at 11/04/18 0146  . sodium chloride Stopped (11/01/18 0104)  . sodium chloride 10 mL/hr at 11/03/18 2338  . sodium chloride    . heparin 999 mL/hr at 10/31/18 0534  . meropenem (MERREM) IV 1 g (11/03/18 2231)  . prismasol BGK 4/2.5 1,500 mL/hr at 11/04/18 0823   PRN Meds:.Place/Maintain arterial line **AND** sodium chloride, acetaminophen, heparin, heparin, heparin, HYDROmorphone (DILAUDID) injection, ondansetron (ZOFRAN) IV, polyethylene glycol, promethazine, sodium chloride  flush  ABG    Component Value Date/Time   PHART 7.433 10/31/2018 0228   PCO2ART 56.4 (H) 10/31/2018 0228   PO2ART 82.0 (L) 10/31/2018 0228   HCO3 37.2 (H) 10/31/2018 0228   TCO2 39 (H) 10/31/2018 0228   ACIDBASEDEF 8.0 (H) 10/30/2018 1356   O2SAT 95.0 10/31/2018 0228    A  1. Dialysis dependent AKI from ATN, anuric 2. Severe pancreatitis 3. Septic shock on NE; resolved 4. Metabolic Acidosis 5. Hypocalcemia; stable on 2.5Ca bath 6. Anemia 7. VDRF, extubated 11/26 8. AMS  Hold CRRT today if desired can increase ultrafiltration rate prior to discontinuation, will discuss with critical care; will tentatively attempt HD on 11/29.   Pearson Grippe, MD [facility] pgr [phone]

## 2018-11-04 NOTE — Progress Notes (Signed)
Discussed TPN needs w/ Dr. Joelyn Oms with Nephrology, does not want pt to have a PICC access for TPN, other access should be established.

## 2018-11-04 NOTE — Progress Notes (Signed)
[place] Progress Note Patient Name: Derek Riley DOB: June 02, 1951 MRN: [id number]   Date of Service  11/04/2018  HPI/Events of Note  Bedside nurse concern for tachypnea but able to maintain sats on [place]. Visualized portion of lung on CXR without significant change. Patient not in acute distress.  eICU Interventions  Tachypnea likely compensatory for metabolic acidosis.  No acute need to intubate at this time but will need to closely monitor.     Intervention Category Intermediate Interventions: Respiratory distress - evaluation and management  Judd Lien 11/04/2018, 10:00 PM

## 2018-11-04 NOTE — Progress Notes (Signed)
Re-attempted NG tube: this time with success: previous Xray both pending : Canister already filled with 500cc of gastric content

## 2018-11-04 NOTE — Progress Notes (Signed)
Nutrition Follow-up  DOCUMENTATION CODES:   Not applicable  INTERVENTION:    TPN dosing per Pharmacy.  When able to transition to enteral nutrition, if unable to safely take POs, recommend jejunal feeding tube, Vital AF 1.2 at 80 ml/h would meet 100% of kcal and protein needs.  NUTRITION DIAGNOSIS:   Inadequate oral intake related to inability to eat as evidenced by NPO status.  Ongoing  GOAL:   Patient will meet greater than or equal to 90% of their needs  Unmet  MONITOR:   Diet advancement, Vent status, Labs, Weight trends, I & O's, TF tolerance(GOC)  ASSESSMENT:   67 y/o male PMHx htn/hld, dm2. Presented w/ abd pain that began suddenly 1 day PTA. Supportive care from PCP ineffective and presented to ED w/ vomiting. Determined to have severe gallstone pancreatitis w/ related AKI. Decompensated after arrival, developing  worsening renal failure. On 11/22 transfered to ICU and required intubation. Begun on pressors and CRRT.    Patient was extubated on 11/26. He has been NPO since admission (7 days).   CRRT being held today. Hopeful for transition to [facility] 11/29.  Labs reviewed. Magnesium 2.7 (H) CBG's: 118-140 Medications reviewed and include novolog.  OGT placed last night with 800 ml output. 175 ml output documented so far today. Abdominal x ray shows OGT in the stomach and gaseous distention of the colon. Per discussion with medical team, will begin TPN tomorrow. Currently too late for Pharmacy to mix and hang TPN today.   Diet Order:   Diet Order            Diet NPO time specified  Diet effective now              EDUCATION NEEDS:   No education needs have been identified at this time  Skin:  Skin Assessment: Reviewed RN Assessment  Last BM:  11/26 (smear)  Height:   Ht Readings from Last 1 Encounters:  10/09/2018 6' (1.829 m)    Weight:   Wt Readings from Last 1 Encounters:  11/04/18 89.3 kg    Ideal Body Weight:  80.91 kg  BMI:  Body mass  index is 26.7 kg/m.  Estimated Nutritional Needs:   Kcal:  1610-9604  Protein:  >/= 135 gm  Fluid:  2.3 L    Molli Barrows, RD, LDN, [place] Pager [phone] After Hours Pager [phone]

## 2018-11-04 NOTE — Progress Notes (Signed)
Came to bedside in reference to patient needing additional CVL for initiation of TPN tomorrow.  No PICC per Renal due to ATN and currently HD dependent.  Informed by RN that patient had pulled out his NGT and since with increasing tachypnea with increased O2 needs, now at 6L [place].  ABG noted.  Tachypnea likely related to compensation from metabolic acidosis, pain, and possibly abd distention (363ml output from NGT today).    P:  NTG replaced by myself with confirmation on AXR- thus far ~150 ml out Patient not able to lay flat at this time for placement of left IJ CVL.  May need to consider subclavian line later tonight if no improvement.   Kennieth Rad, AGACNP-BC [facility] Pgr: [phone] or if no answer [phone] 11/04/2018, 11:02 PM

## 2018-11-04 NOTE — Progress Notes (Signed)
NAME:  Derek Riley, MRN:  [id number], DOB:  Sep 15, 1951, LOS: 7 ADMISSION DATE:  10/29/2018, CONSULTATION DATE:  11/22 REFERRING MD:  11/22, CHIEF COMPLAINT:  Worsening metabolic acidosis and renal failure w/ clinical decline  Brief History   67 year old male admitted 11/20 w/ gallstone pancreatitis. PCCM consulted 11/22 for progressive metabolic acidosis, acute renal failure and persistent clinical decline.   Past Medical History  Diabetes, gallstones, hypertension.  Significant Hospital Events   11/20 transferred to [facility] from [place] w/ acute gallstone pancreatitis. GI & surgicals services consulted. Started On supportive IVFs, empiric zosyn and analgesic support.  Surgical service is planning on cholecystectomy once pancreatitis improved.  Initial lipase on arrival to [place], creatinine 1.72 11/21: MRCP 11/21 showing extensive pancreatitis, possible hemorrhagic peripancreatic fluid no choledocholithiasis, lipase 3361, creatinine up to 4.91, anion gap 13.  No role for ERCP continued supportive care 11/22: Creatinine up to 6.95, gap now 16, progressive diffuse mottling, worse pain, critical care consulted for shock, and progressive multiorgan failure, moving to intensive care for CRRT as well as hemodynamic support. 3.7 liters + since admit, developed worsening resp failure requiring intubation shortly after arrival. Possible aspiration during intubation  11/26: Extubation with NGT placed 11/27: CRRT stopped  Consults:  11/20 GI and surgical services 11/22 critical care  Procedures:  11/22 Foley catheter 11/22 right IJ CVL (HD)>>> 11/22 OETT 11/22>>>  Significant Diagnostic Tests:  MRCP 11/21: Acute pancreatitis with extensive pancreatic and periPancreatic inflammatory changes, including proteinaceous and/or hemorrhagic peri-pancreatic fluid.  No choledocholithiasis or biliary tract obstruction.  Small volume of ascites, trace bilateral pleural effusions.  Bilateral  basilar volume loss  Micro Data:  11/26 Procalcitonin: 7.86>>5.45 Antimicrobials:  Zosyn 11/20  Interim history/subjective:  Extubated on 11/26 and patient tolerating well. Had episode of emesis yesterday that was concerning for aspiration. He is awake and alert this am but remains drowsy. Breathing appears to be splinting.  Objective   Blood pressure (!) 112/44, pulse 85, temperature 97.7 F (36.5 C), temperature source Oral, resp. rate (!) 32, height 6' (1.829 m), weight 89.3 kg, SpO2 92 %.    Vent Mode: PSV;CPAP FiO2 (%):  [40 %] 40 % PEEP:  [5 cmH20] 5 cmH20 Pressure Support:  [5 cmH20] 5 cmH20   Intake/Output Summary (Last 24 hours) at 11/04/2018 0853 Last data filed at 11/04/2018 0800 Gross per 24 hour  Intake 732.01 ml  Output 1610 ml  Net -877.99 ml   Filed Weights   11/02/18 0422 11/03/18 0345 11/04/18 0500  Weight: 90.8 kg 90.3 kg 89.3 kg    Examination: General: Ill appearing, mild discomfort, NAD HENT: Dry mucosa, NGT in place Lungs: CTAB, coarse lung sounds in upper lung fields R>L Cardiovascular: RRR, no murmurs Abdomen: soft, non-distended, epigatric area and diffuse very TTP, +bowel sounds Extremities: warm, palpable peripheral pulses, no cyanosis Neuro: no focal deficits GU: foley in place  Resolved Hospital Problem list     Assessment & Plan:   Acute gallstone pancreatitis, currently no evidence of choledocholithiasis from MRCP, however imaging does suggest progressive pancreatic and peripancreatic fluid +/- hemorrhage.  Procalcitonin downtrending Plan Continue n.p.o. Status; NGT tube Repeat CT Abd scan due to continued pain and increasing WBC count Starting TPN Lipase normalized Treat pain Eventually will need cholecystectomy  Acute hypoxic respiratory failure. Developed worsening respiratory distress shortly after arrival to ICU.  C/b possible aspiration event  -CXR c/w RLL aspiration, improving Plan Extubation on 11/26, tolerating  well Changed from Zosyn to Meropenum  due to consistently elevated WBC count continues to rise  Distributive shock; meets SIRS criteria from pancreatitis w/ clinical decline raising concern for sepsis/septic shock  Plan Now resolved, off pressors  AKI -baseline Cr 1. Suspect primarily hemodynamically mediated +/- contrast induced nephropathy. Slowly improving. Plan Stopping CRRT, plan for HD on 11/29 Serial chemistries.   Fluid and electrolyte imbalance: hyperkalemia, hypocalcemia, hypomagnesium  Plan Replace as indicated.  Serial chemistries  Anxiety Patient has been increasingly anxious since being extubated even with pain control and not having breathing difficulties. - Klonopin 0.5mg  BID and seroquel at night - cont to monitor resp function  Leukocytosis  -stable Plan Trend cbc, switch Abx, see problem above  Elevated LFTs in setting of colicystitis (in [place] imaging showed gallstones and CBD stones) these have seemingly passed as were neg on MRCP LFTs trending down, lipase tr down  Plan Intermittent LFTs and lipase   DM w/ hyperglycemia Plan ssi protocol.    Anemia  Plan Trend cbc  Best practice:  Diet: NPO Pain/Anxiety/Delirium protocol (if indicated): fent /precedex  VAP protocol (if indicated):  DVT prophylaxis: change from LMWH to [facility] heparin 11/22 GI prophylaxis: PPI (active reflux) Glucose control: ssi Mobility: br Code Status: full code Family Communication: pending Disposition: ICU  Labs   CBC: Recent Labs  Lab 10/30/18 1817 10/31/18 0402 11/01/18 0449 11/02/18 0428 11/03/18 0355  WBC 29.1* 26.8* 19.9* 24.9* 24.8*  HGB 11.5* 11.3* 9.4* 9.0* 8.3*  HCT 34.8* 33.8* 30.4* 29.9* 27.7*  MCV 89.2 87.3 91.3 93.4 93.6  PLT 149* 142* 114* 118* 113*    Basic Metabolic Panel: Recent Labs  Lab 10/31/18 0402  11/01/18 0449  11/02/18 0428 11/02/18 1612 11/03/18 0355 11/03/18 1622 11/04/18 0445  NA 139   < > 135   < > 136 136 136 136 136  K  3.5   < > 4.4   < > 5.0 4.7 4.8 5.0 4.6  CL 90*   < > 100   < > 103 103 104 103 102  CO2 33*   < > 25   < > 19* 21* 21* 19* 19*  GLUCOSE 136*   < > 135*   < > 151* 115* 118* 125* 140*  BUN 59*   < > 37*   < > 34* 32* 32* 29* 33*  CREATININE 5.22*   < > 3.41*   < > 3.27* 2.86* 2.82* 2.58* 2.55*  CALCIUM 5.5*   < > 6.2*   < > 6.7* 7.1* 6.9* 7.2* 7.1*  MG 1.6*  --  2.5*  --  2.8*  --  2.7*  --  2.7*  PHOS 2.8   < >  --    < > 4.5 3.1 3.0 3.1 3.6   < > = values in this interval not displayed.   GFR: Estimated Creatinine Clearance: 30.9 mL/min (A) (by C-G formula based on SCr of 2.55 mg/dL (H)). Recent Labs  Lab 10/30/18 1542  10/31/18 0402 11/01/18 0449 11/01/18 0450 11/02/18 0428 11/03/18 0355 11/03/18 1100 11/04/18 0445  PROCALCITON  --   --   --   --   --   --   --  7.86 5.45  WBC  --    < > 26.8* 19.9*  --  24.9* 24.8*  --   --   LATICACIDVEN 1.8  --  2.4*  --  1.6  --   --   --   --    < > = values in this  interval not displayed.    Liver Function Tests: Recent Labs  Lab 10/29/18 1125 10/30/18 0331  10/31/18 0402  11/01/18 0449  11/02/18 0428 11/02/18 1612 11/03/18 0355 11/03/18 1622 11/04/18 0445  AST 399* 207*  --  84*  --  55*  --  49*  --   --   --   --   ALT 374* 260*  --  147*  --  90*  --  62*  --   --   --   --   ALKPHOS 50 48  --  72  --  74  --  81  --   --   --   --   BILITOT 6.3* 4.0*  --  2.3*  --  2.5*  --  3.1*  --   --   --   --   PROT 6.4* 5.9*  --  5.7*  --  5.6*  --  6.0*  --   --   --   --   ALBUMIN 2.9* 2.3*   < > 2.0*   < > 1.9*   < > 2.2* 2.0* 1.9* 1.9* 1.7*   < > = values in this interval not displayed.   Recent Labs  Lab 10/15/2018 1524 10/29/18 1125 10/31/18 0402 11/01/18 0449 11/03/18 1100  LIPASE 3,700* 3,361* 304* 81* 43   No results for input(s): AMMONIA in the last 168 hours.  ABG    Component Value Date/Time   PHART 7.433 10/31/2018 0228   PCO2ART 56.4 (H) 10/31/2018 0228   PO2ART 82.0 (L) 10/31/2018 0228   HCO3 37.2 (H)  10/31/2018 0228   TCO2 39 (H) 10/31/2018 0228   ACIDBASEDEF 8.0 (H) 10/30/2018 1356   O2SAT 95.0 10/31/2018 0228     Coagulation Profile: Recent Labs  Lab 11/01/18 0014 11/02/18 0942  INR 1.38 1.39    Cardiac Enzymes: No results for input(s): CKTOTAL, CKMB, CKMBINDEX, TROPONINI in the last 168 hours.  HbA1C: Hgb A1c MFr Bld  Date/Time Value Ref Range Status  10/26/2018 11:23 PM 8.0 (H) 4.8 - 5.6 % Final    Comment:    (NOTE) Pre diabetes:          5.7%-6.4% Diabetes:              >6.4% Glycemic control for   <7.0% adults with diabetes     CBG: Recent Labs  Lab 11/03/18 1522 11/03/18 1944 11/03/18 2354 11/04/18 0401 11/04/18 0733  GLUCAP 101* 113* 142* 133* 118*     Past Medical History  He,  has a past medical history of Diabetes mellitus without complication ([place]), Gallstone (10/2018), Hyperlipidemia, and Hypertension.    Critical care time:  40 min    Harolyn Rutherford, DO [facility], PGY-2  11/04/2018

## 2018-11-04 NOTE — Progress Notes (Signed)
Notified of bilious emesis this evening.  NGT placement noted to be in left lung which was since been removed.  Patient not complaining of nausea currently however abd continues to be distended.  I attempted after placed OGT in R nare after placing gastric tube on ice for 30 mins to make more stiff.  Was unsuccessful as patient continuously coughs and poorly tucks his chin.    Will likely need NGT tube placement by fluro in radiology in am. QTc 0.473, will add zofran prn    Kennieth Rad, AGACNP-BC [place] Pgr: [phone] or if no answer [phone] 11/04/2018, 12:18 AM

## 2018-11-05 ENCOUNTER — Inpatient Hospital Stay (HOSPITAL_COMMUNITY): Payer: Medicare Other

## 2018-11-05 LAB — GLUCOSE, CAPILLARY
Glucose-Capillary: 155 mg/dL — ABNORMAL HIGH (ref 70–99)
Glucose-Capillary: 163 mg/dL — ABNORMAL HIGH (ref 70–99)
Glucose-Capillary: 164 mg/dL — ABNORMAL HIGH (ref 70–99)
Glucose-Capillary: 177 mg/dL — ABNORMAL HIGH (ref 70–99)
Glucose-Capillary: 238 mg/dL — ABNORMAL HIGH (ref 70–99)
Glucose-Capillary: 246 mg/dL — ABNORMAL HIGH (ref 70–99)

## 2018-11-05 LAB — TRIGLYCERIDES
TRIGLYCERIDES: 219 mg/dL — AB (ref <150)
Triglycerides: 214 mg/dL — ABNORMAL HIGH (ref <150)

## 2018-11-05 LAB — POCT I-STAT 3, ART BLOOD GAS (G3+)
Acid-base deficit: 1 mmol/L (ref 0.0–2.0)
Acid-base deficit: 3 mmol/L — ABNORMAL HIGH (ref 0.0–2.0)
Acid-base deficit: 4 mmol/L — ABNORMAL HIGH (ref 0.0–2.0)
Bicarbonate: 19 mmol/L — ABNORMAL LOW (ref 20.0–28.0)
Bicarbonate: 21.1 mmol/L (ref 20.0–28.0)
Bicarbonate: 22.5 mmol/L (ref 20.0–28.0)
O2 Saturation: 95 %
O2 Saturation: 95 %
O2 Saturation: 96 %
PCO2 ART: 31.4 mmHg — AB (ref 32.0–48.0)
Patient temperature: 98.3
Patient temperature: 98.8
TCO2: 20 mmol/L — ABNORMAL LOW (ref 22–32)
TCO2: 22 mmol/L (ref 22–32)
TCO2: 24 mmol/L (ref 22–32)
pCO2 arterial: 26.9 mmHg — ABNORMAL LOW (ref 32.0–48.0)
pCO2 arterial: 32.4 mmHg (ref 32.0–48.0)
pH, Arterial: 7.437 (ref 7.350–7.450)
pH, Arterial: 7.45 (ref 7.350–7.450)
pH, Arterial: 7.456 — ABNORMAL HIGH (ref 7.350–7.450)
pO2, Arterial: 70 mmHg — ABNORMAL LOW (ref 83.0–108.0)
pO2, Arterial: 72 mmHg — ABNORMAL LOW (ref 83.0–108.0)
pO2, Arterial: 77 mmHg — ABNORMAL LOW (ref 83.0–108.0)

## 2018-11-05 LAB — DIFFERENTIAL
Abs Immature Granulocytes: 1.2 10*3/uL — ABNORMAL HIGH (ref 0.00–0.07)
BASOS ABS: 0 10*3/uL (ref 0.0–0.1)
Basophils Relative: 0 %
Eosinophils Absolute: 0 10*3/uL (ref 0.0–0.5)
Eosinophils Relative: 0 %
LYMPHS PCT: 5 %
Lymphs Abs: 2 10*3/uL (ref 0.7–4.0)
MONO ABS: 3.2 10*3/uL — AB (ref 0.1–1.0)
Metamyelocytes Relative: 1 %
Monocytes Relative: 8 %
Myelocytes: 2 %
NEUTROS ABS: 33.3 10*3/uL — AB (ref 1.7–7.7)
NRBC: 1 /100{WBCs} — AB
Neutrophils Relative %: 84 %

## 2018-11-05 LAB — COMPREHENSIVE METABOLIC PANEL
ALBUMIN: 1.6 g/dL — AB (ref 3.5–5.0)
ALT: 36 U/L (ref 0–44)
AST: 56 U/L — ABNORMAL HIGH (ref 15–41)
Alkaline Phosphatase: 133 U/L — ABNORMAL HIGH (ref 38–126)
Anion gap: 16 — ABNORMAL HIGH (ref 5–15)
BUN: 58 mg/dL — ABNORMAL HIGH (ref 8–23)
CO2: 20 mmol/L — ABNORMAL LOW (ref 22–32)
CREATININE: 4.48 mg/dL — AB (ref 0.61–1.24)
Calcium: 6.9 mg/dL — ABNORMAL LOW (ref 8.9–10.3)
Chloride: 101 mmol/L (ref 98–111)
GFR calc Af Amer: 15 mL/min — ABNORMAL LOW (ref >60)
GFR calc non Af Amer: 13 mL/min — ABNORMAL LOW (ref >60)
Glucose, Bld: 155 mg/dL — ABNORMAL HIGH (ref 70–99)
Potassium: 4.5 mmol/L (ref 3.5–5.1)
Sodium: 137 mmol/L (ref 135–145)
Total Bilirubin: 3.7 mg/dL — ABNORMAL HIGH (ref 0.3–1.2)
Total Protein: 5.5 g/dL — ABNORMAL LOW (ref 6.5–8.1)

## 2018-11-05 LAB — CBC
HCT: 25.4 % — ABNORMAL LOW (ref 39.0–52.0)
Hemoglobin: 8.1 g/dL — ABNORMAL LOW (ref 13.0–17.0)
MCH: 27.8 pg (ref 26.0–34.0)
MCHC: 31.9 g/dL (ref 30.0–36.0)
MCV: 87.3 fL (ref 80.0–100.0)
NRBC: 0.8 % — AB (ref 0.0–0.2)
PLATELETS: 129 10*3/uL — AB (ref 150–400)
RBC: 2.91 MIL/uL — ABNORMAL LOW (ref 4.22–5.81)
RDW: 15 % (ref 11.5–15.5)
WBC: 39.6 10*3/uL — AB (ref 4.0–10.5)

## 2018-11-05 LAB — RENAL FUNCTION PANEL
ANION GAP: 14 (ref 5–15)
Albumin: 1.6 g/dL — ABNORMAL LOW (ref 3.5–5.0)
BUN: 71 mg/dL — ABNORMAL HIGH (ref 8–23)
CALCIUM: 6.9 mg/dL — AB (ref 8.9–10.3)
CO2: 23 mmol/L (ref 22–32)
Chloride: 102 mmol/L (ref 98–111)
Creatinine, Ser: 5.31 mg/dL — ABNORMAL HIGH (ref 0.61–1.24)
GFR calc non Af Amer: 10 mL/min — ABNORMAL LOW (ref >60)
GFR, EST AFRICAN AMERICAN: 12 mL/min — AB (ref >60)
Glucose, Bld: 162 mg/dL — ABNORMAL HIGH (ref 70–99)
PHOSPHORUS: 4.1 mg/dL (ref 2.5–4.6)
Potassium: 4.7 mmol/L (ref 3.5–5.1)
SODIUM: 139 mmol/L (ref 135–145)

## 2018-11-05 LAB — CULTURE, BLOOD (ROUTINE X 2)
CULTURE: NO GROWTH
Culture: NO GROWTH
Special Requests: ADEQUATE

## 2018-11-05 LAB — PROCALCITONIN: Procalcitonin: 6.25 ng/mL

## 2018-11-05 LAB — LACTIC ACID, PLASMA: LACTIC ACID, VENOUS: 1.6 mmol/L (ref 0.5–1.9)

## 2018-11-05 LAB — PHOSPHORUS: Phosphorus: 3.6 mg/dL (ref 2.5–4.6)

## 2018-11-05 LAB — MAGNESIUM: Magnesium: 3.2 mg/dL — ABNORMAL HIGH (ref 1.7–2.4)

## 2018-11-05 LAB — PREALBUMIN

## 2018-11-05 MED ORDER — STERILE WATER FOR INJECTION IV SOLN
INTRAVENOUS | Status: DC
  Administered 2018-11-05: 03:00:00 via INTRAVENOUS
  Filled 2018-11-05 (×2): qty 850

## 2018-11-05 MED ORDER — TRACE MINERALS CR-CU-MN-SE-ZN 10-1000-500-60 MCG/ML IV SOLN
INTRAVENOUS | Status: AC
  Administered 2018-11-05: 17:00:00 via INTRAVENOUS
  Filled 2018-11-05: qty 360

## 2018-11-05 MED ORDER — CHLORHEXIDINE GLUCONATE CLOTH 2 % EX PADS
6.0000 | MEDICATED_PAD | Freq: Every day | CUTANEOUS | Status: DC
  Administered 2018-11-06: 6 via TOPICAL

## 2018-11-05 NOTE — Progress Notes (Signed)
PHARMACY - ADULT TOTAL PARENTERAL NUTRITION CONSULT NOTE   Pharmacy Consult for TPN Indication: Necrotizing Pancreatitis  Patient Measurements: Height: 6' (182.9 cm) Weight: 194 lb 3.6 oz (88.1 kg) IBW/kg (Calculated) : 77.6 TPN AdjBW (KG): 87.4 Body mass index is 26.34 kg/m. Usual Weight: 88 kg  Assessment: 67 yo m with a PMH of DM, gallstones, and HTN. Originally admitted to [facility] on 11/20 after transfer d/t acute gallstone pancreatitis. On 11/21, patient had ERCP which showed extensive pancreatitis and possible hemorrhagic peripancreatic fluid. Patient was moved to ICU on 11/22 and received CRRT unitl 11/27 and was intubated from 11/22 to 11/26. CT abdomen 11/26 showed necrotizing pancreatitis.   GI: Pending cholecystectomy when pancreatitis has resolved. Patient currently NPO for > 7 days. Initial NGT placed overnight 11/27 and immediately put out > 500 mL. NGT was removed and replaced 11/28 early morning with immediately ~ 150 mL output 11/28 alb 1.6, prealb < 5  Endo: CBGs < 180s on SSI Insulin requirements in the past 24 hours: ~ 18 units SSI Lytes: WNL including corrected calcium Renal: severe AKI from ATN - patient anuric; 5 days of CRRT and switched to iHD with plans for initial session 11/29 Pulm: was intubated 11/22>>11/26 - now on HFNC with continued tachypnea.  Cards: VSS was on pressors from 11/21-11/24 Hepatobil: AST/ALT WNL, T Bili elevated 3.7, Trigs 215 Neuro: patient has been anxious seroquel 50 mg qHS and clonazepam prn ID: [id number] for necrotizing pancreatitis. WBC continues to climb, now 39.6; cx negative   Zosyn 11/20 >> 11/26 Meropenem 11/26>>  TPN Access: HD cath pigtail TPN start date: 11/05/18 Nutritional Goals (per RD recommendation on 11/27): KCal: 2300 - 2600 / day Protein: > 135 g / day Fluid: 2.3 L  Goal TPN: 80 mL/hr with 144 g AA 15%, Dex 20% (384 g), Lipids 30 g/L - this will provide 2457 kcal/day   Current Nutrition: NPO, TPN  Plan:  Initial  TPN at 30 mL/hr - will go slow since patient at high risk for re-feeding; can initiate lipids as patient has been in ICU for > 7 days  This TPN provides 54 g of protein, 144 g of dextrose, and 14.4 g of lipids which provides 850 kCals per day  Electrolytes in TPN: None to start - plan is to initiate iHD 11/29 but may need return to CRRT Add MVI, trace elements - monitor TBili and signs of jaundice for need to adjust  Continue resistant SSI q4h and adjust as needed  Monitor TPN labs, Bmet, Mg, Phos, Trigs in am  Levester Fresh, PharmD, BCPS, BCCCP Clinical Pharmacist [phone]  Please check AMION for all [place] numbers  11/05/2018 9:12 AM

## 2018-11-05 NOTE — Progress Notes (Signed)
Admit: 10/15/2018 LOS: 8  64M AKI from ATN, severe pancreatitis, septic shock  Subjective:  . Tachypnea overnight, on nasal cannula . Started on sodium bicarbonate infusion; ABG was 7.45/27/1972 . No urine output . Blood pressure stable, not on vasopressor . CT of abdomen yesterday with IV contrast: Some suggestion of pancreatic necrosis, pancreatitis present . Creatinine 4.48, potassium 4.5, bicarbonate 20, BUN 58 this morning  11/27 0701 - 11/28 0700 In: 690.1 [I.V.:460.1; NG/GT:30; IV Piggyback:200] Out: 988 [Emesis/NG output:800]  Filed Weights   11/03/18 0345 11/04/18 0500 11/05/18 0654  Weight: 90.3 kg 89.3 kg 88.1 kg    Scheduled Meds: . Chlorhexidine Gluconate Cloth  6 each Topical Daily  . heparin injection (subcutaneous)  5,000 Units Subcutaneous Q8H  . insulin aspart  0-20 Units Subcutaneous Q4H  . lidocaine  15 mL Mouth/Throat Once  . mouth rinse  15 mL Mouth Rinse BID  . QUEtiapine  50 mg Oral QHS  . sodium chloride flush  10-40 mL Intracatheter Q12H   Continuous Infusions: . sodium chloride Stopped (11/04/18 2127)  . sodium chloride    . meropenem (MERREM) IV Stopped (11/04/18 2157)  . TPN ADULT (ION)     PRN Meds:.Place/Maintain arterial line **AND** sodium chloride, acetaminophen, clonazepam, heparin, HYDROmorphone (DILAUDID) injection, ondansetron (ZOFRAN) IV, polyethylene glycol, promethazine, sodium chloride flush  Current Labs: reviewed    Physical Exam:  Blood pressure 132/61, pulse 94, temperature 98.2 F (36.8 C), temperature source Oral, resp. rate (!) 43, height 6' (1.829 m), weight 88.1 kg, SpO2 94 %. In mild respiratory distress, tachypneic, increased work of breathing RRR, no rub, normal S1 and S2 Trace lower extremity edema Soft abdomen, quiet bowel sounds No rashes or lesions Right IJ temp cath clean/dry/intact  A 1. Dialysis dependent AKI from ATN, anuric; off CRRT 11/27 2. Severe pancreatitis; on meropenem 3. Septic shock on NE;  resolved 4. Metabolic Acidosis; stasble 5. Hypocalcemia; stable on 2.5Ca bath 6. Anemia 7. VDRF, extubated 11/26 8. AMS  P . Stop sodium bicarbonate, I do not think acidosis is driving his tachypnea . No immediate indication for hemodialysis; if decompensates today will require reinitiation of CRRT . Tentative plan for intermittent hemodialysis tomorrow morning: 2K, 2-3L, Qb 350, 4h, no heparin    Pearson Grippe MD 11/05/2018, 11:00 AM  Recent Labs  Lab 11/04/18 0445 11/04/18 1713 11/05/18 0540  NA 136 134* 137  K 4.6 4.7 4.5  CL 102 102 101  CO2 19* 18* 20*  GLUCOSE 140* 155* 155*  BUN 33* 40* 58*  CREATININE 2.55* 3.03* 4.48*  CALCIUM 7.1* 7.0* 6.9*  PHOS 3.6 3.3 3.6   Recent Labs  Lab 11/03/18 0355 11/04/18 1131 11/05/18 0443  WBC 24.8* 37.4* 39.6*  NEUTROABS  --   --  33.3*  HGB 8.3* 8.3* 8.1*  HCT 27.7* 26.6* 25.4*  MCV 93.6 91.4 87.3  PLT 113* 116* 129*

## 2018-11-05 NOTE — Progress Notes (Signed)
Arrived to pt room for transfer of care from nurse Caryl Pina to note pt somnolent ngt out of nose and lying next to pt. Pt verbalized he removed ngt due to discomfort, need for ngt reinforced and education on ngt insertion provided to pt. [place] notified of ngt removal per pt as well as a concern for labored rr and increased oxygen demand. Elink nurse states md will have ground team to perform bedside assessment.

## 2018-11-05 NOTE — Progress Notes (Signed)
NAME:  Derek Riley, MRN:  [id number], DOB:  [birth year], LOS: 8 ADMISSION DATE:  10/11/2018, CONSULTATION DATE:  11/22 REFERRING MD:  11/22, CHIEF COMPLAINT:  Worsening metabolic acidosis and renal failure w/ clinical decline  Brief History   67 year old male admitted 11/20 w/ gallstone pancreatitis. PCCM consulted 11/22 for progressive metabolic acidosis, acute renal failure and persistent clinical decline.    Past Medical History  Diabetes, gallstones, hypertension.  Significant Hospital Events   11/20 transferred to [facility] from [place] w/ acute gallstone pancreatitis. GI & surgicals services consulted. Started On supportive IVFs, empiric zosyn and analgesic support.  Surgical service is planning on cholecystectomy once pancreatitis improved.  Initial lipase on arrival to [place], creatinine 1.72 11/21: MRCP 11/21 showing extensive pancreatitis, possible hemorrhagic peripancreatic fluid no choledocholithiasis, lipase 3361, creatinine up to 4.91, anion gap 13.  No role for ERCP continued supportive care 11/22: Creatinine up to 6.95, gap now 16, progressive diffuse mottling, worse pain, critical care consulted for shock, and progressive multiorgan failure, moving to intensive care for CRRT as well as hemodynamic support. 3.7 liters + since admit, developed worsening resp failure requiring intubation shortly after arrival. Possible aspiration during intubation  11/26 started on meropenem (d/t rising CBC) CRRT stopped 11/27 extubated, pressors off.  11/28 look weaker. WIll probably need re-intubation.   Consults:  11/20 GI and surgical services 11/22 critical care  Procedures:  11/22 Foley catheter 11/22 right IJ CVL (HD)>>> 11/22 OETT 11/22>>>  Significant Diagnostic Tests:  MRCP 11/21: Acute pancreatitis with extensive pancreatic and periPancreatic inflammatory changes, including proteinaceous and/or hemorrhagic peri-pancreatic fluid.  No choledocholithiasis or biliary tract  obstruction.  Small volume of ascites, trace bilateral pleural effusions.  Bilateral basilar volume loss  Micro Data:   Antimicrobials:  Zosyn 11/20>>>11/26 Meropenem 11/26>>>  Interim history/subjective:  Weaker. Increased WOB. Cough mechanics weak   Objective   Blood pressure (Abnormal) 128/57, pulse 92, temperature 99.9 F (37.7 C), temperature source Axillary, resp. rate (Abnormal) 46, height 6' (1.829 m), weight 88.1 kg, SpO2 95 %.        Intake/Output Summary (Last 24 hours) at 11/05/2018 1530 Last data filed at 11/05/2018 1300 Gross per 24 hour  Intake 565.21 ml  Output 1600 ml  Net -1034.79 ml   Filed Weights   11/03/18 0345 11/04/18 0500 11/05/18 0654  Weight: 90.3 kg 89.3 kg 88.1 kg    Examination: General acutely ill appearing white male, extremely weak. Poor cough HENT MM are dry. Bilious NGT output  Pulm diffuse rhonchi, marked accessory use. Very poor cough mechanics Card rrr (tacy) abd still w/ hypoactive BS still diffusely tender GU minimal UOP  Neuro awake will f/c but diffusely weak   Resolved Hospital Problem list   Distributive shock  Assessment & Plan:   Acute gallstone pancreatitis w/ evolution of necrosis, currently no evidence of choledocholithiasis from MRCP, however imaging does suggest progressive pancreatic and peripancreatic fluid +/- hemorrhage.  Plan Cont NPO status Repeat lipase am TPN to start today  Will eventually need cholecystomy  Meropenem day 3 (added 11/26 d/t no improvement)  Acute hypoxic respiratory failure. Developed worsening respiratory distress shortly after arrival to ICU.  C/b possible aspiration event  -extubated 11/28 -post extubation film worsening bibasilar atx/ element of edema  Plan Repeating ABG and lactic acid Will likely need re-intubation  Wean oxygen   AKI -baseline Cr 1. Suspect primarily hemodynamically mediated +/- contrast induced nephropathy.  -CRRT stopped 11/18; cr rising  Plan Trend  chemistry Strict I&O prob resume HD or CRRT in am 11/29  Fluid and electrolyte imbalance: hyperkalemia, hypocalcemia, hypomagnesium  Plan Trend chemistry   Leukocytosis  Plan Trend cbc  Elevated LFTs in setting of colicystitis (in [place] imaging showed gallstones and CBD stones) these have seemingly passed as were neg on MRCP LFTs trending down, lipase tr down  Plan Intermittent LFTs and lipase   DM w/ hyperglycemia Plan ssi  Anemia  Plan Trend cbc   Best practice:  Diet: NPO Pain/Anxiety/Delirium protocol (if indicated): fent /precedex  VAP protocol (if indicated):  DVT prophylaxis: change from LMWH to [facility] heparin 11/22 GI prophylaxis: PPI (active reflux) Glucose control: ssi Mobility: br Code Status: full code Family Communication: pending Disposition: ICU   Critical care time:  [place] ACNP-BC [place] Pager # [phone] OR # [phone] if no answer

## 2018-11-06 ENCOUNTER — Inpatient Hospital Stay (HOSPITAL_COMMUNITY): Payer: Medicare Other

## 2018-11-06 LAB — RENAL FUNCTION PANEL
Albumin: 1.4 g/dL — ABNORMAL LOW (ref 3.5–5.0)
Albumin: 1.4 g/dL — ABNORMAL LOW (ref 3.5–5.0)
Anion gap: 16 — ABNORMAL HIGH (ref 5–15)
Anion gap: 14 (ref 5–15)
BUN: 95 mg/dL — ABNORMAL HIGH (ref 8–23)
BUN: 105 mg/dL — ABNORMAL HIGH (ref 8–23)
CHLORIDE: 101 mmol/L (ref 98–111)
CO2: 22 mmol/L (ref 22–32)
CO2: 24 mmol/L (ref 22–32)
Calcium: 6.6 mg/dL — ABNORMAL LOW (ref 8.9–10.3)
Calcium: 6.3 mg/dL — CL (ref 8.9–10.3)
Chloride: 100 mmol/L (ref 98–111)
Creatinine, Ser: 6.41 mg/dL — ABNORMAL HIGH (ref 0.61–1.24)
Creatinine, Ser: 7.21 mg/dL — ABNORMAL HIGH (ref 0.61–1.24)
GFR calc Af Amer: 10 mL/min — ABNORMAL LOW (ref >60)
GFR calc Af Amer: 8 mL/min — ABNORMAL LOW (ref >60)
GFR, EST NON AFRICAN AMERICAN: 8 mL/min — AB (ref >60)
GFR, EST NON AFRICAN AMERICAN: 7 mL/min — AB (ref >60)
Glucose, Bld: 274 mg/dL — ABNORMAL HIGH (ref 70–99)
Glucose, Bld: 199 mg/dL — ABNORMAL HIGH (ref 70–99)
Phosphorus: 5.2 mg/dL — ABNORMAL HIGH (ref 2.5–4.6)
Phosphorus: 5.1 mg/dL — ABNORMAL HIGH (ref 2.5–4.6)
Potassium: 4.7 mmol/L (ref 3.5–5.1)
Potassium: 5.2 mmol/L — ABNORMAL HIGH (ref 3.5–5.1)
Sodium: 138 mmol/L (ref 135–145)
Sodium: 139 mmol/L (ref 135–145)

## 2018-11-06 LAB — POCT I-STAT 3, ART BLOOD GAS (G3+)
Acid-Base Excess: 1 mmol/L (ref 0.0–2.0)
Bicarbonate: 24.8 mmol/L (ref 20.0–28.0)
O2 SAT: 95 %
TCO2: 26 mmol/L (ref 22–32)
pCO2 arterial: 34.4 mmHg (ref 32.0–48.0)
pH, Arterial: 7.467 — ABNORMAL HIGH (ref 7.350–7.450)
pO2, Arterial: 72 mmHg — ABNORMAL LOW (ref 83.0–108.0)

## 2018-11-06 LAB — GLUCOSE, CAPILLARY
Glucose-Capillary: 250 mg/dL — ABNORMAL HIGH (ref 70–99)
Glucose-Capillary: 258 mg/dL — ABNORMAL HIGH (ref 70–99)
Glucose-Capillary: 219 mg/dL — ABNORMAL HIGH (ref 70–99)
Glucose-Capillary: 176 mg/dL — ABNORMAL HIGH (ref 70–99)
Glucose-Capillary: 184 mg/dL — ABNORMAL HIGH (ref 70–99)

## 2018-11-06 LAB — ALT: ALT: 30 U/L (ref 0–44)

## 2018-11-06 MED ORDER — MIDAZOLAM HCL 2 MG/2ML IJ SOLN
2.0000 mg | Freq: Once | INTRAMUSCULAR | Status: AC
  Administered 2018-11-06: 2 mg via INTRAVENOUS

## 2018-11-06 MED ORDER — FENTANYL BOLUS VIA INFUSION
50.0000 ug | INTRAVENOUS | Status: DC | PRN
  Administered 2018-11-07 – 2018-11-12 (×4): 50 ug via INTRAVENOUS
  Filled 2018-11-06: qty 50

## 2018-11-06 MED ORDER — TRACE MINERALS CR-CU-MN-SE-ZN 10-1000-500-60 MCG/ML IV SOLN
INTRAVENOUS | Status: AC
  Administered 2018-11-06: 18:00:00 via INTRAVENOUS
  Filled 2018-11-06: qty 360

## 2018-11-06 MED ORDER — SODIUM CHLORIDE 0.9 % IV SOLN: 100.0000 mL | INTRAVENOUS | Status: DC | PRN

## 2018-11-06 MED ORDER — ORAL CARE MOUTH RINSE
15.0000 mL | OROMUCOSAL | Status: DC
  Administered 2018-11-06 – 2018-11-20 (×142): 15 mL via OROMUCOSAL

## 2018-11-06 MED ORDER — NOREPINEPHRINE 16 MG/250ML-% IV SOLN
0.0000 ug/min | INTRAVENOUS | Status: DC
  Administered 2018-11-06: 30 ug/min via INTRAVENOUS
  Administered 2018-11-07: 21 ug/min via INTRAVENOUS
  Administered 2018-11-08: 40 ug/min via INTRAVENOUS
  Administered 2018-11-08: 29 ug/min via INTRAVENOUS
  Administered 2018-11-09: 31 ug/min via INTRAVENOUS
  Administered 2018-11-09 – 2018-11-10 (×2): 40 ug/min via INTRAVENOUS
  Administered 2018-11-10: 36 ug/min via INTRAVENOUS
  Filled 2018-11-06 (×11): qty 250

## 2018-11-06 MED ORDER — ALTEPLASE 2 MG IJ SOLR
2.0000 mg | Freq: Once | INTRAMUSCULAR | Status: DC | PRN
  Filled 2018-11-06: qty 2

## 2018-11-06 MED ORDER — HEPARIN SODIUM (PORCINE) 1000 UNIT/ML DIALYSIS: 1000.0000 [IU] | INTRAMUSCULAR | Status: DC | PRN

## 2018-11-06 MED ORDER — CHLORHEXIDINE GLUCONATE 0.12% ORAL RINSE (MEDLINE KIT)
15.0000 mL | Freq: Two times a day (BID) | OROMUCOSAL | Status: DC
  Administered 2018-11-06 – 2018-11-20 (×28): 15 mL via OROMUCOSAL

## 2018-11-06 MED ORDER — SODIUM CHLORIDE 0.9 % IV BOLUS
500.0000 mL | Freq: Once | INTRAVENOUS | Status: AC
  Administered 2018-11-06: 500 mL via INTRAVENOUS

## 2018-11-06 MED ORDER — MIDAZOLAM HCL 2 MG/2ML IJ SOLN
INTRAMUSCULAR | Status: AC
  Administered 2018-11-06: 2 mg via INTRAVENOUS
  Filled 2018-11-06: qty 2

## 2018-11-06 MED ORDER — FENTANYL 2500MCG IN NS 250ML (10MCG/ML) PREMIX INFUSION
0.0000 ug/h | INTRAVENOUS | Status: DC
  Administered 2018-11-06: 50 ug/h via INTRAVENOUS
  Administered 2018-11-06 – 2018-11-07 (×2): 225 ug/h via INTRAVENOUS
  Administered 2018-11-07: 350 ug/h via INTRAVENOUS
  Administered 2018-11-07: 300 ug/h via INTRAVENOUS
  Administered 2018-11-08 (×2): 400 ug/h via INTRAVENOUS
  Administered 2018-11-08: 100 ug/h via INTRAVENOUS
  Administered 2018-11-08: 400 ug/h via INTRAVENOUS
  Administered 2018-11-09: 150 ug/h via INTRAVENOUS
  Administered 2018-11-09: 200 ug/h via INTRAVENOUS
  Administered 2018-11-10: 125 ug/h via INTRAVENOUS
  Administered 2018-11-11: 300 ug/h via INTRAVENOUS
  Administered 2018-11-12: 400 ug/h via INTRAVENOUS
  Administered 2018-11-12: 75 ug/h via INTRAVENOUS
  Filled 2018-11-06 (×15): qty 250

## 2018-11-06 MED ORDER — FENTANYL CITRATE (PF) 100 MCG/2ML IJ SOLN
100.0000 ug | Freq: Once | INTRAMUSCULAR | Status: AC
  Administered 2018-11-06: 100 ug via INTRAVENOUS

## 2018-11-06 MED ORDER — NOREPINEPHRINE 4 MG/250ML-% IV SOLN
INTRAVENOUS | Status: AC
  Administered 2018-11-06: 5 mg
  Filled 2018-11-06: qty 250

## 2018-11-06 MED ORDER — HEPARIN SODIUM (PORCINE) 1000 UNIT/ML IJ SOLN
INTRAMUSCULAR | Status: AC
  Filled 2018-11-06: qty 4

## 2018-11-06 MED ORDER — MIDAZOLAM HCL 2 MG/2ML IJ SOLN
2.0000 mg | INTRAMUSCULAR | Status: DC | PRN
  Administered 2018-11-06 – 2018-11-16 (×15): 2 mg via INTRAVENOUS
  Filled 2018-11-06 (×15): qty 2

## 2018-11-06 MED ORDER — LIDOCAINE HCL (PF) 1 % IJ SOLN: 5.0000 mL | INTRAMUSCULAR | Status: DC | PRN

## 2018-11-06 MED ORDER — LIDOCAINE-PRILOCAINE 2.5-2.5 % EX CREA
1.0000 "application " | TOPICAL_CREAM | CUTANEOUS | Status: DC | PRN
  Filled 2018-11-06: qty 5

## 2018-11-06 MED ORDER — PENTAFLUOROPROP-TETRAFLUOROETH EX AERO: 1.0000 "application " | INHALATION_SPRAY | CUTANEOUS | Status: DC | PRN

## 2018-11-06 MED ORDER — ROCURONIUM BROMIDE 50 MG/5ML IV SOLN
1.0000 mg/kg | Freq: Once | INTRAVENOUS | Status: AC
  Administered 2018-11-06: 80 mg via INTRAVENOUS
  Filled 2018-11-06: qty 8.7

## 2018-11-06 MED ORDER — ETOMIDATE 2 MG/ML IV SOLN
0.3000 mg/kg | Freq: Once | INTRAVENOUS | Status: AC
  Administered 2018-11-06: 20 mg via INTRAVENOUS

## 2018-11-06 MED ORDER — FENTANYL CITRATE (PF) 100 MCG/2ML IJ SOLN
INTRAMUSCULAR | Status: AC
  Administered 2018-11-06: 100 ug via INTRAVENOUS
  Filled 2018-11-06: qty 2

## 2018-11-06 NOTE — Progress Notes (Signed)
On initial assessment, noted pt had signficantly rhonchorous breath sounds bilaterally, belly breathing, with RR in the 40's. Noted that NGT canister was not properly sealed, pt also actively vomiting. Adjusted canister and pt's NGT immediately dumped 223ml's bile into canister. Pt's telemetry strip also irregular w/ increased frequency of PAC's. Pt also lethargic and unable to follow commands or verbalize answers to questions. Notified CCM, awaiting orders.

## 2018-11-06 NOTE — Progress Notes (Signed)
Nutrition Follow-up  DOCUMENTATION CODES:   Not applicable  INTERVENTION:    TPN dosing per Pharmacy.  When able to transition to enteral nutrition, if unable to safely take POs, recommend jejunal feeding tube, Vital AF 1.2 at 80 ml/h would meet 100% of kcal and protein needs  NUTRITION DIAGNOSIS:   Inadequate oral intake related to inability to eat as evidenced by NPO status.  Ongoing  GOAL:   Patient will meet greater than or equal to 90% of their needs  Progressing  MONITOR:   Diet advancement, Vent status, Labs, Weight trends, I & O's, TF tolerance(GOC)  REASON FOR ASSESSMENT:   Ventilator    ASSESSMENT:   67 y/o male PMHx htn/hld, dm2. Presented w/ abd pain that began suddenly 1 day PTA. Supportive care from PCP ineffective and presented to ED w/ vomiting. Determined to have severe gallstone pancreatitis w/ related AKI. Decompensated after arrival, developing  worsening renal failure. On 11/22 transfered to ICU and required intubation. Begun on pressors and CRRT.    11/26- extubated 11/27- pt removed NGT, replaced 11/29- HD held today, re-intubated, central venous catheter placed, plan to assess need for HD in AM per nephrology  Reviewed I/O's: -1.3 L x 24 hours and +5.2 L since admission  OGT output: +2 L x 24 hours  Patient re-intubated on ventilator support. OGT connected to low intermittent suction.  MV: 13.5 L/min Temp (24hrs), Avg:99 F (37.2 C), Min:98.1 F (36.7 C), Max:100.6 F (38.1 C)  TPN infusing at 30 ml/hr (holding on advancement due to high CBGS). Regimen provides 850 kcals and 54 grams protein daily. Plan to advance slowly related to high refeeding risk.   Labs reviewed: K WDL, Phos: 5.2,CBGS: 219-258 (inpatient orders for glycemic control are 0-20 units insulin aspart every 4 hours).   Diet Order:   Diet Order            Diet NPO time specified  Diet effective now              EDUCATION NEEDS:   No education needs have been  identified at this time  Skin:  Skin Assessment: Reviewed RN Assessment  Last BM:  11/05/18  Height:   Ht Readings from Last 1 Encounters:  11/06/18 6' (1.829 m)    Weight:   Wt Readings from Last 1 Encounters:  11/06/18 87 kg    Ideal Body Weight:  80.91 kg  BMI:  Body mass index is 26.01 kg/m.  Estimated Nutritional Needs:   Kcal:  2189  Protein:  130-145 grams  Fluid:  per MD    Britiny Defrain A. Jimmye Norman, RD, LDN, CDE Pager: [phone] After hours Pager: [phone]

## 2018-11-06 NOTE — Procedures (Signed)
Intubation Procedure Note  Derek Riley  [id number] 1951-05-12   Procedure: Intubation Indications: Respiratory insufficiency  Procedure Details Consent: Risks of procedure as well as the alternatives and risks of each were explained to the (patient/caregiver).  Consent for procedure obtained. Time Out: Verified patient identification, verified procedure, site/side was marked, verified correct patient position, special equipment/implants available, medications/allergies/relevent history reviewed, required imaging and test results available.  Performed  Pre-oxygenation: 100% via NRB Premedication: fentanyl 166mg, versed 230mPosition: supine, beechchair Induction agent: Etomidate 2064maralytic: Rocuronium 25m84mchnique: DL MAC 4 Tube size: 7.5 Laryngoscopy view: grade 2 Number of attempts: 1 Insertion depth: 24cm at lip Tube secured: tube holder Other findings: anterior larynx.   OGT/NGT inserted: no  Position confirmed by auscultation: n/a  Evaluation Colorimetric change: yes Bilateral breath sounds: yes Hemodynamic Status: Persistent hypotension treated with pressors; O2 sats: stable throughout Patient's Current Condition: stable Complications: No apparent complications Patient did tolerate procedure well. Chest X-ray ordered to verify placement.  CXR: pending.   RaviEinar Gradrwala 10/21/2018

## 2018-11-06 NOTE — Progress Notes (Signed)
Pharmacy Antibiotic Note  Derek Riley is a 67 y.o. male admitted on 10/19/2018 with necrotizing pancreatitis.  Pharmacy has been consulted for meropenem dosing.   On abx for acute gallstone pancreatitis w/ evolution of necrosis per CT on 11/27. Will eventually need lap chole. Last lipase down to 43 on 11/26. Tmax of 99.9 yesterday, WBC elevated at 39.6.  Plan: Continue meropenem 500mg  IV Q24h Monitor clinical picture, renal function F/U LOT  Height: 6' (182.9 cm) Weight: 194 lb 3.6 oz (88.1 kg) IBW/kg (Calculated) : 77.6  Temp (24hrs), Avg:98.9 F (37.2 C), Min:98.1 F (36.7 C), Max:99.9 F (37.7 C)  Recent Labs  Lab 10/30/18 1542  10/31/18 0402  11/01/18 0449 11/01/18 0450  11/02/18 0428  11/03/18 0355  11/04/18 0445 11/04/18 1131 11/04/18 1713 11/05/18 0443 11/05/18 0540 11/05/18 1538 11/05/18 1602 11/06/18 0355  WBC  --    < > 26.8*  --  19.9*  --   --  24.9*  --  24.8*  --   --  37.4*  --  39.6*  --   --   --   --   CREATININE  --    < > 5.22*   < > 3.41* 3.39*   < > 3.27*   < > 2.82*   < > 2.55*  --  3.03*  --  4.48* 5.31*  --  6.41*  LATICACIDVEN 1.8  --  2.4*  --   --  1.6  --   --   --   --   --   --   --   --   --   --   --  1.6  --    < > = values in this interval not displayed.    Estimated Creatinine Clearance: 12.3 mL/min (A) (by C-G formula based on SCr of 6.41 mg/dL (H)).    No Known Allergies  Thank you for allowing pharmacy to be a part of this patient's care.  Reginia Naas 11/06/2018 7:26 AM

## 2018-11-06 NOTE — Procedures (Signed)
Central Venous Catheter Insertion Procedure Note Marcelino Cam[facility] [id number] [birth year]  Procedure: Insertion of Central Venous Catheter Indications: TPN  Procedure Details Consent: Unable to obtain consent because of emergent medical necessity. Time Out: Verified patient identification, verified procedure, site/side was marked, verified correct patient position, special equipment/implants available, medications/allergies/relevent history reviewed, required imaging and test results available.  Performed  Maximum sterile technique was used including antiseptics, cap, gloves, gown, hand hygiene, mask and sheet. Skin prep: Chlorhexidine; local anesthetic administered A antimicrobial bonded/coated 29F  triple lumen catheter was placed in the left internal jugular vein using the Seldinger technique. Under ultrasound guidance.   Evaluation Blood flow good Complications: No apparent complications Patient did tolerate procedure well. Chest X-ray ordered to verify placement.  CXR: pending.  Oron Westrup 11/06/2018, 9:42 AM

## 2018-11-06 NOTE — Progress Notes (Signed)
PHARMACY - ADULT TOTAL PARENTERAL NUTRITION CONSULT NOTE   Pharmacy Consult:  TPN Indication: Necrotizing Pancreatitis  Patient Measurements: Height: 6' (182.9 cm) Weight: 194 lb 3.6 oz (88.1 kg) IBW/kg (Calculated) : 77.6 TPN AdjBW (KG): 87.4 Body mass index is 26.34 kg/m. Usual Weight: 88 kg  Assessment:  81 YOM with PMH of DM, gallstones, and HTN. Originally admitted to [facility] on 11/20 after transfer d/t acute gallstone pancreatitis. ERCP on 10/29/18 showed extensive pancreatitis and possible hemorrhagic peripancreatic fluid. Patient was moved to ICU on 11/22 and received CRRT unitl 11/27. CT abdomen 11/26 showed necrotizing pancreatitis. NGT placed overnight 11/27 and immediately put out > 500 mL.  GI: pending cholecystectomy when pancreatitis has resolved.  Baseline prealbumin < 5, NG O/P 1933m Endo: DM on metformin PTA.  CBGs uncontrolled (200s) with initiation of TPN Insulin requirements in the past 24 hours: 30 units SSI Lytes: Phos 5.2, CoCa low normal (Mag was 3.2 on 11/28) Renal: severe AKI from ATN, s/p 5d CRRT >> iHD, initial session 11/29, net +5.1L Pulm: intubated 11/22>>11/26 - now on HFNC with tachypnea Cards: VSS, s/p pressors 11/21-11/24 Hepatobil: AST/alk phos mildly elevated, ALT WNL, tbili elevated 3.7, TG elevated at 219 (ILE 23% of total kCal) Neuro: Seroquel for anxiety, PRN Dilaudid/Klonopin ID: necrotizing pancreatitis - afebrile, WBC 39.6, LA 1.6, PC 6.25  Zosyn 11/20 >> 11/26 Merrem 11/26>>  TPN Access: HD cath pigtail TPN start date: 11/05/18  Nutritional Goals (per RD rec on 11/27): 2300 - 2600, > 135gm protein per day  Current Nutrition:  TPN  Plan:  Continue concentrated TPN at 30 ml/hr (goal 80 ml/hr).  Advance once CBGs are better controlled. TPN provides 54g AA, 144g CHO, and 14g ILE which provides 850 kCals per day Electrolytes in TPN: none except add calcium, max acetate Daily multivitamin and trace elements in TPN.  Monitor TBili and  signs of jaundice for need to adjust. Continue resistant SSI Q4H and add 15 units regular insulin in TPN F/U AM labs, CBGs, dialysis plans   Kamarri Fischetti D. DMina Marble PharmD, BCPS, BAda11/29/2019, 7:45 AM

## 2018-11-06 NOTE — Progress Notes (Signed)
Admit: 10/30/2018 LOS: 9  38M AKI from ATN, severe pancreatitis, septic shock  Subjective:  Marland Kitchen Did not tolerate intermittent hemodialysis this morning secondary to hypotension, treatment stopped . Aspiration, increased work of breathing and reintubated . Nearly 2 L of NG output yesterday . No urine output . Potassium 4.7, BUN 95, bicarbonate 22  11/28 0701 - 11/29 0700 In: 612 [I.V.:511.9; IV Piggyback:100.1] Out: 1950 [Emesis/NG VXYIAX:6553]  Filed Weights   11/04/18 0500 11/05/18 0654 11/06/18 0704  Weight: 89.3 kg 88.1 kg 87 kg    Scheduled Meds: . chlorhexidine gluconate (MEDLINE KIT)  15 mL Mouth Rinse BID  . Chlorhexidine Gluconate Cloth  6 each Topical Q0600  . heparin      . heparin injection (subcutaneous)  5,000 Units Subcutaneous Q8H  . insulin aspart  0-20 Units Subcutaneous Q4H  . mouth rinse  15 mL Mouth Rinse 10 times per day  . sodium chloride flush  10-40 mL Intracatheter Q12H   Continuous Infusions: . sodium chloride Stopped (11/06/18 0847)  . sodium chloride    . sodium chloride    . sodium chloride    . fentaNYL infusion INTRAVENOUS 100 mcg/hr (11/06/18 0900)  . meropenem (MERREM) IV Stopped (11/06/18 0030)  . norepinephrine (LEVOPHED) Adult infusion Stopped (11/06/18 0928)  . TPN ADULT (ION) 30 mL/hr at 11/06/18 0900  . TPN ADULT (ION)     PRN Meds:.Place/Maintain arterial line **AND** sodium chloride, sodium chloride, sodium chloride, acetaminophen, alteplase, fentaNYL, heparin, heparin, lidocaine (PF), lidocaine-prilocaine, midazolam, ondansetron (ZOFRAN) IV, pentafluoroprop-tetrafluoroeth, promethazine, sodium chloride flush  Current Labs: reviewed    Physical Exam:  Blood pressure (!) 125/59, pulse 74, temperature 98.3 F (36.8 C), temperature source Oral, resp. rate 18, height 6' (1.829 m), weight 87 kg, SpO2 100 %. Intubated, sedated RRR, no rub, normal S1 and S2 Trace lower extremity edema Soft abdomen, quiet bowel sounds No rashes or  lesions Right IJ temp cath clean/dry/intact  A 1. Dialysis dependent AKI from ATN, anuric; off CRRT 11/27 2. Severe pancreatitis; on meropenem 3. Septic shock on NE; resolved 4. Metabolic Acidosis; resolved 5. Hypocalcemia; stable on 2.5Ca bath 6. Anemia 7. VDRF, extubated 11/26, reintubated 11/29 8. AMS  P . No strong indications for reattempting dialysis today . We will reassess tomorrow, blood pressure seems to support intermittent hemodialysis but certainly did not tolerate that today . This will be a protracted course, will need to tunnel his dialysis catheter when stable    Pearson Grippe MD 11/06/2018, 10:42 AM  Recent Labs  Lab 11/05/18 0540 11/05/18 1538 11/06/18 0355  NA 137 139 138  K 4.5 4.7 4.7  CL 101 102 100  CO2 20* 23 22  GLUCOSE 155* 162* 274*  BUN 58* 71* 95*  CREATININE 4.48* 5.31* 6.41*  CALCIUM 6.9* 6.9* 6.6*  PHOS 3.6 4.1 5.2*   Recent Labs  Lab 11/03/18 0355 11/04/18 1131 11/05/18 0443  WBC 24.8* 37.4* 39.6*  NEUTROABS  --   --  33.3*  HGB 8.3* 8.3* 8.1*  HCT 27.7* 26.6* 25.4*  MCV 93.6 91.4 87.3  PLT 113* 116* 129*

## 2018-11-06 NOTE — Progress Notes (Signed)
NAME:  Derek Riley, MRN:  [id number], DOB:  1951-02-01, LOS: 9 ADMISSION DATE:  10/18/2018, CONSULTATION DATE:  11/22 REFERRING MD:  11/22, CHIEF COMPLAINT:  Worsening metabolic acidosis and renal failure w/ clinical decline  Brief History   67 year old male admitted 11/20 w/ gallstone pancreatitis. PCCM consulted 11/22 for progressive metabolic acidosis, acute renal failure and persistent clinical decline.   Past Medical History  Diabetes, gallstones, hypertension.  Significant Hospital Events   11/20 transferred to [facility] from [place] w/ acute gallstone pancreatitis. GI & surgicals services consulted. Started On supportive IVFs, empiric zosyn and analgesic support.  Surgical service is planning on cholecystectomy once pancreatitis improved.  Initial lipase on arrival to [place], creatinine 1.72 11/21: MRCP 11/21 showing extensive pancreatitis, possible hemorrhagic peripancreatic fluid no choledocholithiasis, lipase 3361, creatinine up to 4.91, anion gap 13.  No role for ERCP continued supportive care 11/22: Creatinine up to 6.95, gap now 16, progressive diffuse mottling, worse pain, critical care consulted for shock, and progressive multiorgan failure, moving to intensive care for CRRT as well as hemodynamic support. 3.7 liters + since admit, developed worsening resp failure requiring intubation shortly after arrival. Possible aspiration during intubation  11/26 started on meropenem (d/t rising CBC) CRRT stopped 11/27 extubated, pressors off.  11/28 look weaker. WIll probably need re-intubation. 11/29 reintubated for increasing WOB, likely aspiration even overnight.   Consults:  11/20 GI and surgical services 11/22 critical care  Procedures:  11/22 Foley catheter 11/22 right IJ CVL (HD)>>> 11/22 OETT 11/22>>>  Significant Diagnostic Tests:  MRCP 11/21: Acute pancreatitis with extensive pancreatic and periPancreatic inflammatory changes, including proteinaceous and/or  hemorrhagic peri-pancreatic fluid.  No choledocholithiasis or biliary tract obstruction.  Small volume of ascites, trace bilateral pleural effusions.  Bilateral basilar volume loss  Micro Data:  Cultures negative Antimicrobials:  Zosyn 11/20>>>11/26 Meropenem 11/26>>>  Interim history/subjective:  NGT not to suction overnight - patient vomited with aspiration. More stuporous with increasing tachypnea, prompting re-intubation.  Objective   Blood pressure 123/60, pulse 86, temperature 98.3 F (36.8 C), temperature source Oral, resp. rate (!) 37, height 6' (1.829 m), weight 87 kg, SpO2 96 %.    Vent Mode: PRVC FiO2 (%):  [100 %] 100 % Set Rate:  [18 bmp] 18 bmp Vt Set:  [620 mL] 620 mL PEEP:  [5 cmH20] 5 cmH20 Plateau Pressure:  [23 cmH20] 23 cmH20   Intake/Output Summary (Last 24 hours) at 11/06/2018 0838 Last data filed at 11/06/2018 0800 Gross per 24 hour  Intake 537.06 ml  Output 1790 ml  Net -1252.94 ml   Filed Weights   11/04/18 0500 11/05/18 0654 11/06/18 0704  Weight: 89.3 kg 88.1 kg 87 kg    Examination: General acutely ill appearing white male, extremely weak.  HENT MM are dry. Bilious NGT output.  ETT now in place. Pulm diffuse rhonchi, no ventilator asynchrony. Card extremities well perfused. HS normal. abd less distended, BS remain hypoactive.  GU Foley out. Neuro now sedated+ NMB post intubation.  Resolved Hospital Problem list   Distributive shock  Assessment & Plan:   Critically ill due to acute respiratory failure requiring mechanical ventilation due to atelectasis from hypoventilation and persistent lung injury. Possible aspiration event.  Generalized weakness an impaired cough. Acute gallstone pancreatitis w/ evolution of necrosis, currently no evidence of choledocholithiasis from MRCP, however imaging does suggest progressive pancreatic and peripancreatic fluid +/- hemorrhage.   Leukocytosis consistent with ongoing pancreatic inflammation. AKI  baseline Cr 1. Suspect primarily hemodynamically  mediated +/- contrast induced nephropathy.  Hyperglycemia from stress, pancreatic insufficiency and TPN  Plan Cont NPO status TPN to continue  Will eventually need cholecystomy  Meropenem day 3 (added 11/26 for necrotic pancreatitis. ABG and titrate ventilatory support Attempt IHD tomorrow. Increase insulin in TPN Likely protracted course - will benefit from tracheostomy - procedure intent and benefits explained to brother - consent obtained.  Best practice:  Diet: NPO - TPN Pain/Anxiety/Delirium protocol (if indicated): fent /precedex  VAP protocol (if indicated):  bundle DVT prophylaxis: change from LMWH to [employer] heparin 11/22 GI prophylaxis: PPI (active reflux) Glucose control: ssi and insulin tpn. Mobility: bed rest Code Status: full code Family Communication: brother updated by phone this morning. Disposition: ICU   Critical care time:  40 minutes.    Kipp Brood, MD [facility] ICU Physician [place]  Pager: [phone] Mobile: [phone] After hours: [phone].

## 2018-11-06 NOTE — Progress Notes (Signed)
Derek Riley was started on regular HD at 65. At this point there was already a plan to intubate him as staff was getting materials and machine ready. AT 8 am his HD tx was discontinued with Dr. Adin Hector approval due to very low BP's despite cutting off UF (no fluid removal). Report given to [facility] ICU RN Caryl Pina.

## 2018-11-07 ENCOUNTER — Inpatient Hospital Stay (HOSPITAL_COMMUNITY): Payer: Medicare Other

## 2018-11-07 LAB — BASIC METABOLIC PANEL
Anion gap: 18 — ABNORMAL HIGH (ref 5–15)
BUN: 117 mg/dL — ABNORMAL HIGH (ref 8–23)
CO2: 22 mmol/L (ref 22–32)
Calcium: 6.5 mg/dL — ABNORMAL LOW (ref 8.9–10.3)
Chloride: 98 mmol/L (ref 98–111)
Creatinine, Ser: 8.24 mg/dL — ABNORMAL HIGH (ref 0.61–1.24)
GFR calc Af Amer: 7 mL/min — ABNORMAL LOW (ref >60)
GFR calc non Af Amer: 6 mL/min — ABNORMAL LOW (ref >60)
Glucose, Bld: 211 mg/dL — ABNORMAL HIGH (ref 70–99)
Potassium: 5.7 mmol/L — ABNORMAL HIGH (ref 3.5–5.1)
Sodium: 138 mmol/L (ref 135–145)

## 2018-11-07 LAB — HEPATITIS B SURFACE ANTIBODY,QUALITATIVE: Hep B S Ab: NONREACTIVE

## 2018-11-07 LAB — RENAL FUNCTION PANEL
ALBUMIN: 1.3 g/dL — AB (ref 3.5–5.0)
ANION GAP: 15 (ref 5–15)
BUN: 103 mg/dL — ABNORMAL HIGH (ref 8–23)
CO2: 21 mmol/L — ABNORMAL LOW (ref 22–32)
Calcium: 6 mg/dL — CL (ref 8.9–10.3)
Chloride: 102 mmol/L (ref 98–111)
Creatinine, Ser: 6.75 mg/dL — ABNORMAL HIGH (ref 0.61–1.24)
GFR calc Af Amer: 9 mL/min — ABNORMAL LOW (ref >60)
GFR calc non Af Amer: 8 mL/min — ABNORMAL LOW (ref >60)
GLUCOSE: 188 mg/dL — AB (ref 70–99)
PHOSPHORUS: 5 mg/dL — AB (ref 2.5–4.6)
Potassium: 5.4 mmol/L — ABNORMAL HIGH (ref 3.5–5.1)
Sodium: 138 mmol/L (ref 135–145)

## 2018-11-07 LAB — CBC
HCT: 24.6 % — ABNORMAL LOW (ref 39.0–52.0)
Hemoglobin: 8.1 g/dL — ABNORMAL LOW (ref 13.0–17.0)
MCH: 28 pg (ref 26.0–34.0)
MCHC: 32.9 g/dL (ref 30.0–36.0)
MCV: 85.1 fL (ref 80.0–100.0)
Platelets: 149 10*3/uL — ABNORMAL LOW (ref 150–400)
RBC: 2.89 MIL/uL — ABNORMAL LOW (ref 4.22–5.81)
RDW: 15.4 % (ref 11.5–15.5)
WBC: 62 10*3/uL (ref 4.0–10.5)
nRBC: 1 % — ABNORMAL HIGH (ref 0.0–0.2)

## 2018-11-07 LAB — GLUCOSE, CAPILLARY
GLUCOSE-CAPILLARY: 146 mg/dL — AB (ref 70–99)
GLUCOSE-CAPILLARY: 187 mg/dL — AB (ref 70–99)
Glucose-Capillary: 190 mg/dL — ABNORMAL HIGH (ref 70–99)
Glucose-Capillary: 199 mg/dL — ABNORMAL HIGH (ref 70–99)
Glucose-Capillary: 166 mg/dL — ABNORMAL HIGH (ref 70–99)
Glucose-Capillary: 118 mg/dL — ABNORMAL HIGH (ref 70–99)

## 2018-11-07 LAB — HEPATIC FUNCTION PANEL
ALBUMIN: 1.4 g/dL — AB (ref 3.5–5.0)
ALT: 28 U/L (ref 0–44)
AST: 75 U/L — ABNORMAL HIGH (ref 15–41)
Alkaline Phosphatase: 159 U/L — ABNORMAL HIGH (ref 38–126)
BILIRUBIN TOTAL: 4 mg/dL — AB (ref 0.3–1.2)
Bilirubin, Direct: 2.6 mg/dL — ABNORMAL HIGH (ref 0.0–0.2)
Indirect Bilirubin: 1.4 mg/dL — ABNORMAL HIGH (ref 0.3–0.9)
Total Protein: 6.3 g/dL — ABNORMAL LOW (ref 6.5–8.1)

## 2018-11-07 LAB — MAGNESIUM: MAGNESIUM: 3.2 mg/dL — AB (ref 1.7–2.4)

## 2018-11-07 LAB — PHOSPHORUS: Phosphorus: 5 mg/dL — ABNORMAL HIGH (ref 2.5–4.6)

## 2018-11-07 LAB — HEPATITIS B CORE ANTIBODY, TOTAL: Hep B Core Total Ab: POSITIVE — AB

## 2018-11-07 LAB — HEPATITIS B SURFACE ANTIGEN: Hepatitis B Surface Ag: NEGATIVE

## 2018-11-07 MED ORDER — DEXMEDETOMIDINE HCL IN NACL 400 MCG/100ML IV SOLN
0.0000 ug/kg/h | INTRAVENOUS | Status: AC
  Administered 2018-11-07: 0.8 ug/kg/h via INTRAVENOUS
  Administered 2018-11-07: 0.9 ug/kg/h via INTRAVENOUS
  Administered 2018-11-07: 0.8 ug/kg/h via INTRAVENOUS
  Administered 2018-11-08: 0.6 ug/kg/h via INTRAVENOUS
  Administered 2018-11-08 (×2): 1.2 ug/kg/h via INTRAVENOUS
  Administered 2018-11-08: 1 ug/kg/h via INTRAVENOUS
  Administered 2018-11-08: 0.6 ug/kg/h via INTRAVENOUS
  Administered 2018-11-09: 0.7 ug/kg/h via INTRAVENOUS
  Administered 2018-11-09 – 2018-11-10 (×3): 0.6 ug/kg/h via INTRAVENOUS
  Administered 2018-11-10 (×2): 0.7 ug/kg/h via INTRAVENOUS
  Administered 2018-11-11 (×2): 0.8 ug/kg/h via INTRAVENOUS
  Filled 2018-11-07 (×5): qty 100
  Filled 2018-11-07: qty 200
  Filled 2018-11-07 (×13): qty 100

## 2018-11-07 MED ORDER — PRISMASOL BGK 4/2.5 32-4-2.5 MEQ/L REPLACEMENT SOLN
Status: DC
  Administered 2018-11-07 – 2018-11-08 (×2): via INTRAVENOUS_CENTRAL
  Filled 2018-11-07 (×2): qty 5000

## 2018-11-07 MED ORDER — PANTOPRAZOLE SODIUM 40 MG IV SOLR
40.0000 mg | Freq: Every day | INTRAVENOUS | Status: DC
  Administered 2018-11-07 – 2018-11-20 (×14): 40 mg via INTRAVENOUS
  Filled 2018-11-07 (×14): qty 40

## 2018-11-07 MED ORDER — SODIUM CHLORIDE 0.9 % FOR CRRT
INTRAVENOUS_CENTRAL | Status: DC | PRN
  Administered 2018-11-08: 21:00:00 via INTRAVENOUS_CENTRAL
  Filled 2018-11-07: qty 1000

## 2018-11-07 MED ORDER — PRISMASOL BGK 4/2.5 32-4-2.5 MEQ/L IV SOLN
INTRAVENOUS | Status: DC
  Administered 2018-11-07 – 2018-11-08 (×8): via INTRAVENOUS_CENTRAL
  Filled 2018-11-07 (×15): qty 5000

## 2018-11-07 MED ORDER — HEPARIN SODIUM (PORCINE) 1000 UNIT/ML DIALYSIS
1000.0000 [IU] | INTRAMUSCULAR | Status: DC | PRN
  Administered 2018-11-09: 3000 [IU] via INTRAVENOUS_CENTRAL
  Filled 2018-11-07 (×2): qty 6

## 2018-11-07 MED ORDER — SODIUM CHLORIDE 0.9 % IV SOLN
1.0000 g | Freq: Two times a day (BID) | INTRAVENOUS | Status: DC
  Administered 2018-11-07 – 2018-11-11 (×9): 1 g via INTRAVENOUS
  Filled 2018-11-07 (×10): qty 1

## 2018-11-07 MED ORDER — PRISMASOL BGK 4/2.5 32-4-2.5 MEQ/L REPLACEMENT SOLN
Status: DC
  Administered 2018-11-07 – 2018-11-18 (×15): via INTRAVENOUS_CENTRAL
  Filled 2018-11-07 (×46): qty 5000

## 2018-11-07 MED ORDER — SODIUM CHLORIDE 0.9 % IV SOLN
200.0000 mg | Freq: Once | INTRAVENOUS | Status: AC
  Administered 2018-11-07: 200 mg via INTRAVENOUS
  Filled 2018-11-07: qty 200

## 2018-11-07 MED ORDER — TRACE MINERALS CR-CU-MN-SE-ZN 10-1000-500-60 MCG/ML IV SOLN
INTRAVENOUS | Status: AC
  Administered 2018-11-07: 17:00:00 via INTRAVENOUS
  Filled 2018-11-07: qty 624

## 2018-11-07 MED ORDER — SODIUM CHLORIDE 0.9 % IV SOLN
100.0000 mg | INTRAVENOUS | Status: DC
  Administered 2018-11-08 – 2018-11-10 (×3): 100 mg via INTRAVENOUS
  Filled 2018-11-07 (×4): qty 100

## 2018-11-07 NOTE — Progress Notes (Addendum)
NAME:  Derek Riley, MRN:  [id number], DOB:  [birth year], LOS: 73 ADMISSION DATE:  10/27/2018, CONSULTATION DATE:  11/22 REFERRING MD:  11/22, CHIEF COMPLAINT:  Worsening metabolic acidosis and renal failure w/ clinical decline  Brief History   67 year old male admitted 11/20 w/ gallstone pancreatitis. PCCM consulted 11/22 for progressive metabolic acidosis, acute renal failure and persistent clinical decline.   Past Medical History  Diabetes, gallstones, hypertension.  Significant Hospital Events   11/20 transferred to [facility] from [place] w/ acute gallstone pancreatitis. GI & surgicals services consulted. Started On supportive IVFs, empiric zosyn and analgesic support.  Surgical service is planning on cholecystectomy once pancreatitis improved.  Initial lipase on arrival to [place], creatinine 1.72 11/21: MRCP 11/21 showing extensive pancreatitis, possible hemorrhagic peripancreatic fluid no choledocholithiasis, lipase 3361, creatinine up to 4.91, anion gap 13.  No role for ERCP continued supportive care 11/22: Creatinine up to 6.95, gap now 16, progressive diffuse mottling, worse pain, critical care consulted for shock, and progressive multiorgan failure, moving to intensive care for CRRT as well as hemodynamic support. 3.7 liters + since admit, developed worsening resp failure requiring intubation shortly after arrival. Possible aspiration during intubation  11/26 started on meropenem (d/t rising CBC) CRRT stopped 11/27 extubated, pressors off.  11/28 look weaker. WIll probably need re-intubation. 11/29 reintubated for increasing WOB, likely aspiration even overnight.  11/30: Remains intubated.  White blood cell count now up to 62,000.  Consults:  11/20 GI and surgical services 11/22 critical care  Procedures:  11/22 Foley catheter 11/22 right IJ CVL (HD)>>> 11/22 OETT 11/22>>>  Significant Diagnostic Tests:  MRCP 11/21: Acute pancreatitis with extensive pancreatic and  periPancreatic inflammatory changes, including proteinaceous and/or hemorrhagic peri-pancreatic fluid.  No choledocholithiasis or biliary tract obstruction.  Small volume of ascites, trace bilateral pleural effusions.  Bilateral basilar volume loss  Micro Data:  Cultures negative Antimicrobials:  Zosyn 11/20>>>11/26 Meropenem 11/26>>>  Interim history/subjective:  Remains on full ventilator support requiring sedation  Objective   Blood pressure (Abnormal) 130/47, pulse 78, temperature (Abnormal) 100.8 F (38.2 C), temperature source Axillary, resp. rate 18, height 6' (1.829 m), weight 88.1 kg, SpO2 97 %. CVP:  [8 mmHg] 8 mmHg  Vent Mode: PRVC FiO2 (%):  [50 %-100 %] 50 % Set Rate:  [18 bmp] 18 bmp Vt Set:  [620 mL] 620 mL PEEP:  [10 cmH20] 10 cmH20 Plateau Pressure:  [18 cmH20-28 cmH20] 27 cmH20   Intake/Output Summary (Last 24 hours) at 11/07/2018 0919 Last data filed at 11/07/2018 0800 Gross per 24 hour  Intake 1567.57 ml  Output 1050 ml  Net 517.57 ml   Filed Weights   11/05/18 0654 11/06/18 0704 11/07/18 0500  Weight: 88.1 kg 87 kg 88.1 kg    Examination: General: Critically ill 67 year old male remains on full ventilator support HEENT normocephalic atraumatic no jugular venous distention Pulmonary: Coarse, scattered rhonchi, no accessory use. Cardiac: Regular rate and rhythm Abdomen: Active bowel sounds, remains tender, no organomegaly. GU: Clear yellow Neuro: Opens eyes, gets agitated very easily, no focal deficits. Extremities: Diffuse anasarca.  Resolved Hospital Problem list   Distributive shock   Assessment & Plan:   Acute gallstone pancreatitis development of creatinine necrosis -No evidence of choledocholelithiasis on MRCP Plan Continue n.p.o. Status Continue TPN PRN analgesia Day 4 meropenem, added 11/26 for necrotic pancreas and concern for secondary infection Eventually needs cholecystectomy  Ongoing leukocytosis in setting of pancreatic  inflammation Plan Trend CBC Given the degree of the leukocytosis,  will also check stool for C. difficile as he is been on prolonged antibiotics   Acute hypoxic respiratory failure requiring mechanical ventilation in the setting of acute aspiration event 11/29, progressive atelectasis, and decreased abdominal compliance from pain and impaired cough _PCXR personally reviewed: improved aeration  Plan PAD protocol RASS goal -1 to -2 Continue full ventilator support VAP bundle Will likely need trach  Severe deconditioning and malnutrition Plan Continue TPN Suspect LTAC setting  Acute kidney injury with baseline creatinine 1.  This was hemodynamically mediated plus/minus contrast-induced nephropathy Plan Nephrology following Reattempt dialysis today, may end up requiring CRRT Serial chemistries Will eventually need tunneled dialysis catheter  Ongoing hypotension: Appears to be drug related from narcotics Plan Continuing norepinephrine map goal greater than 65  Hyperglycemia Plan Sliding scale insulin  Intermittent fluid and electrolyte imbalance Plan Monitor, replace as indicated  Best practice:  Diet: NPO - TPN Pain/Anxiety/Delirium protocol (if indicated): fent /precedex  VAP protocol (if indicated):  bundle DVT prophylaxis: change from LMWH to [facility] heparin 11/22 GI prophylaxis: PPI (active reflux) Glucose control: ssi and insulin tpn. Mobility: bed rest Code Status: full code Family Communication: brother updated by phone this morning. Disposition: ICU   Critical care time: 35 minutes

## 2018-11-07 NOTE — Progress Notes (Signed)
Admit: 10/30/2018 LOS: 10  61M AKI from ATN, severe pancreatitis, septic shock  Subjective:  . Back on NE Febrile . K 5.7, BUN 117 . Remains anuric  11/29 0701 - 11/30 0700 In: 2641.1 [I.V.:1441.1; IV Piggyback:1200] Out: 1040 [Emesis/NG output:1200]  Filed Weights   11/05/18 0654 11/06/18 0704 11/07/18 0500  Weight: 88.1 kg 87 kg 88.1 kg    Scheduled Meds: . chlorhexidine gluconate (MEDLINE KIT)  15 mL Mouth Rinse BID  . Chlorhexidine Gluconate Cloth  6 each Topical Q0600  . heparin injection (subcutaneous)  5,000 Units Subcutaneous Q8H  . insulin aspart  0-20 Units Subcutaneous Q4H  . mouth rinse  15 mL Mouth Rinse 10 times per day  . sodium chloride flush  10-40 mL Intracatheter Q12H   Continuous Infusions: .  prismasol BGK 4/2.5    .  prismasol BGK 4/2.5    . sodium chloride    . sodium chloride    . sodium chloride    . fentaNYL infusion INTRAVENOUS 250 mcg/hr (11/07/18 0800)  . meropenem (MERREM) IV Stopped (11/06/18 2151)  . norepinephrine (LEVOPHED) Adult infusion Stopped (11/07/18 0755)  . prismasol BGK 4/2.5    . sodium chloride    . TPN ADULT (ION) 30 mL/hr at 11/07/18 0800  . TPN ADULT (ION)     PRN Meds:.Place/Maintain arterial line **AND** sodium chloride, sodium chloride, sodium chloride, acetaminophen, alteplase, fentaNYL, heparin, heparin, heparin, lidocaine (PF), lidocaine-prilocaine, midazolam, ondansetron (ZOFRAN) IV, pentafluoroprop-tetrafluoroeth, promethazine, sodium chloride, sodium chloride flush  Current Labs: reviewed    Physical Exam:  Blood pressure (!) 130/47, pulse 78, temperature (!) 100.8 F (38.2 C), temperature source Axillary, resp. rate 18, height 6' (1.829 m), weight 88.1 kg, SpO2 97 %. Intubated, sedated RRR, no rub, normal S1 and S2 Trace lower extremity edema Soft abdomen, quiet bowel sounds No rashes or lesions Right IJ temp cath clean/dry/intact  A 1. Dialysis dependent AKI from ATN, anuric; off CRRT 11/27, back on  11/30 2. Severe pancreatitis; on meropenem 3. Septic shock on NE; recurrent 4. Metabolic Acidosis;  5. Hypocalcemia; stable on 2.5Ca bath; corrects 2/2 hypoalbuminemia 6. Anemia 7. VDRF, extubated 11/26, reintubated 11/29; hypoxic 8. AMS 9. Temp HD cath present since 11/22  P . Resume CRRT: all 4K, net even, no anticoagulation . This will be a protracted course, will need to tunnel his dialysis catheter when stable and not overtly infected    Pearson Grippe MD 11/07/2018, 9:21 AM  Recent Labs  Lab 11/06/18 0355 11/06/18 1806 11/07/18 0408  NA 138 139 138  K 4.7 5.2* 5.7*  CL 100 101 98  CO2 _0 GLUCOSE 274* 199* 211*  BUN 95* 105* 117*  CREATININE 6.41* 7.21* 8.24*  CALCIUM 6.6* 6.3* 6.5*  PHOS 5.2* 5.1* 5.0*   Recent Labs  Lab 11/04/18 1131 11/05/18 0443 11/07/18 0408  WBC 37.4* 39.6* 62.0*  NEUTROABS  --  33.3*  --   HGB 8.3* 8.1* 8.1*  HCT 26.6* 25.4* 24.6*  MCV 91.4 87.3 85.1  PLT 116* 129* 149*

## 2018-11-07 NOTE — Progress Notes (Signed)
PHARMACY - ADULT TOTAL PARENTERAL NUTRITION CONSULT NOTE   Pharmacy Consult:  TPN Indication: Necrotizing pancreatitis  Patient Measurements: Height: 6' (182.9 cm) Weight: 194 lb 3.6 oz (88.1 kg) IBW/kg (Calculated) : 77.6 TPN AdjBW (KG): 87.4 Body mass index is 26.34 kg/m. Usual Weight: 88 kg  Assessment:  68 YOM with PMH of DM, gallstones, and HTN. Originally admitted to [facility] on 11/20 after transfer d/t acute gallstone pancreatitis. ERCP on 10/29/18 showed extensive pancreatitis and possible hemorrhagic peripancreatic fluid. Patient was moved to ICU on 11/22 and received CRRT unitl 11/27. CT abdomen 11/26 showed necrotizing pancreatitis. NGT placed overnight 11/27 and immediately put out > 500 mL.  GI: pending cholecystectomy when pancreatitis has resolved.  Baseline prealbumin < 5, NG O/P 1270m (down) Endo: DM on metformin PTA.  CBGs uncontrolled with TPN, improving with insulin added to TPN Insulin requirements in the past 24 hours: 44 units SSI Lytes: K / Mag / Phos are elevated, CoCa low normal at 8.56 Renal: severe AKI from ATN, s/p 5d CRRT >> iHD but didn't tolerate on 11/29 d/t hypotension, net +6.7L Pulm: intubated 11/22>>11/26, reintubated 11/29 - FiO2 50% Cards: MAP 50-70s, back on pressor, Levophed at 13 Hepatobil: AST/alk phos trending up, ALT WNL, tbili elevated 4 (no clear jaundice per RN), TG elevated at 219 (ILE 25% of total kCal) Neuro: Fentanyl at 225, PRN Dilaudid/Klonopin/APAP, off Seroquel - GCS 13, CPOT 0-6, RASS 2 ID: necrotizing pancreatitis - Tmax 102.7, WBC up to 62, LA 1.6, PC 6.25   Zosyn 11/20 >> 11/26 Merrem 11/26 >>  TPN Access: HD cath pigtail + CVC triple lumen placed 12/06/18 TPN start date: 11/05/18  Nutritional Goals (per RD rec on 11/29): ICU needs: 2189 kCal, 130-145gm protein per day Non-ICU needs: 2300 - 2600, > 135gm protein per day  Current Nutrition:  TPN  Plan:  Adjust TPN d/t change in needs.  Continue TPN with lipids since  already on it and patient with hyperglycemia. Increase concentrated TPN at 50 ml/hr (goal 75 ml/hr) TPN provides 94g AA, 210g CHO, and 37g ILE which provides 1454 kCals per day, meeting ~66% of patient's needs Electrolytes in TPN: none except add sodium and increase Ca, max acetate Daily multivitamin and trace elements in TPN.  Monitor tbili and signs of jaundice for need to adjust. Continue resistant SSI Q4H and increase insulin in TPN to 35 units F/U AM labs, CBGs, dialysis plans, TG on Monday   Miyako Oelke D. DMina Marble PharmD, BCPS, [place]11/30/2019, 7:35 AM

## 2018-11-07 NOTE — Progress Notes (Signed)
Pharmacy Antibiotic Note  Derek Riley is a 67 y.o. male admitted on 10/11/2018 with necrotizing pancreatitis.  Pharmacy has been consulted for meropenem dosing.   On abx for acute gallstone pancreatitis w/ evolution of necrosis per CT on 11/27. Will eventually need lap chole. Pt is febrile with Tmax 103.7 and WBC is significantly elevated at 62. Pt is starting on CRRT today  Plan: Change meropenem to 1gm IV Q12H Monitor clinical picture, renal function F/U LOT  Height: 6' (182.9 cm) Weight: 194 lb 3.6 oz (88.1 kg) IBW/kg (Calculated) : 77.6  Temp (24hrs), Avg:100.3 F (37.9 C), Min:97.8 F (36.6 C), Max:102.7 F (39.3 C)  Recent Labs  Lab 11/01/18 0450  11/02/18 0428  11/03/18 0355  11/04/18 1131  11/05/18 0443 11/05/18 0540 11/05/18 1538 11/05/18 1602 11/06/18 0355 11/06/18 1806 11/07/18 0408  WBC  --   --  24.9*  --  24.8*  --  37.4*  --  39.6*  --   --   --   --   --  62.0*  CREATININE 3.39*   < > 3.27*   < > 2.82*   < >  --    < >  --  4.48* 5.31*  --  6.41* 7.21* 8.24*  LATICACIDVEN 1.6  --   --   --   --   --   --   --   --   --   --  1.6  --   --   --    < > = values in this interval not displayed.    Estimated Creatinine Clearance: 9.5 mL/min (A) (by C-G formula based on SCr of 8.24 mg/dL (H)).    No Known Allergies  Thank you for allowing pharmacy to be a part of this patient's care.  Hadassah Rana, Rande Lawman 11/07/2018 10:12 AM

## 2018-11-08 ENCOUNTER — Inpatient Hospital Stay (HOSPITAL_COMMUNITY): Payer: Medicare Other

## 2018-11-08 DIAGNOSIS — Z930 Tracheostomy status: Secondary | ICD-10-CM

## 2018-11-08 LAB — POCT I-STAT, CHEM 8
BUN: 52 mg/dL — ABNORMAL HIGH (ref 8–23)
BUN: 60 mg/dL — AB (ref 8–23)
CREATININE: 3.6 mg/dL — AB (ref 0.61–1.24)
CREATININE: 4.6 mg/dL — AB (ref 0.61–1.24)
Calcium, Ion: 0.45 mmol/L — CL (ref 1.15–1.40)
Calcium, Ion: 0.89 mmol/L — CL (ref 1.15–1.40)
Chloride: 97 mmol/L — ABNORMAL LOW (ref 98–111)
Chloride: 99 mmol/L (ref 98–111)
Glucose, Bld: 246 mg/dL — ABNORMAL HIGH (ref 70–99)
Glucose, Bld: 255 mg/dL — ABNORMAL HIGH (ref 70–99)
HCT: 25 % — ABNORMAL LOW (ref 39.0–52.0)
HCT: 26 % — ABNORMAL LOW (ref 39.0–52.0)
Hemoglobin: 8.5 g/dL — ABNORMAL LOW (ref 13.0–17.0)
Hemoglobin: 8.8 g/dL — ABNORMAL LOW (ref 13.0–17.0)
Potassium: 4.4 mmol/L (ref 3.5–5.1)
Potassium: 4.7 mmol/L (ref 3.5–5.1)
Sodium: 134 mmol/L — ABNORMAL LOW (ref 135–145)
Sodium: 136 mmol/L (ref 135–145)
TCO2: 24 mmol/L (ref 22–32)
TCO2: 25 mmol/L (ref 22–32)

## 2018-11-08 LAB — RENAL FUNCTION PANEL
ANION GAP: 13 (ref 5–15)
Albumin: 1.4 g/dL — ABNORMAL LOW (ref 3.5–5.0)
Albumin: 1.3 g/dL — ABNORMAL LOW (ref 3.5–5.0)
Albumin: 1.3 g/dL — ABNORMAL LOW (ref 3.5–5.0)
Anion gap: 16 — ABNORMAL HIGH (ref 5–15)
Anion gap: 12 (ref 5–15)
BUN: 75 mg/dL — ABNORMAL HIGH (ref 8–23)
BUN: 66 mg/dL — AB (ref 8–23)
BUN: 62 mg/dL — ABNORMAL HIGH (ref 8–23)
CHLORIDE: 100 mmol/L (ref 98–111)
CO2: 21 mmol/L — ABNORMAL LOW (ref 22–32)
CO2: 21 mmol/L — ABNORMAL LOW (ref 22–32)
CO2: 25 mmol/L (ref 22–32)
Calcium: 7 mg/dL — ABNORMAL LOW (ref 8.9–10.3)
Calcium: 7 mg/dL — ABNORMAL LOW (ref 8.9–10.3)
Calcium: 7.2 mg/dL — ABNORMAL LOW (ref 8.9–10.3)
Chloride: 100 mmol/L (ref 98–111)
Chloride: 99 mmol/L (ref 98–111)
Creatinine, Ser: 4.9 mg/dL — ABNORMAL HIGH (ref 0.61–1.24)
Creatinine, Ser: 4.25 mg/dL — ABNORMAL HIGH (ref 0.61–1.24)
Creatinine, Ser: 4.32 mg/dL — ABNORMAL HIGH (ref 0.61–1.24)
GFR calc Af Amer: 13 mL/min — ABNORMAL LOW (ref >60)
GFR calc Af Amer: 16 mL/min — ABNORMAL LOW (ref >60)
GFR calc non Af Amer: 11 mL/min — ABNORMAL LOW (ref >60)
GFR calc non Af Amer: 13 mL/min — ABNORMAL LOW (ref >60)
GFR calc non Af Amer: 13 mL/min — ABNORMAL LOW (ref >60)
GFR, EST AFRICAN AMERICAN: 15 mL/min — AB (ref >60)
GLUCOSE: 131 mg/dL — AB (ref 70–99)
Glucose, Bld: 117 mg/dL — ABNORMAL HIGH (ref 70–99)
Glucose, Bld: 83 mg/dL (ref 70–99)
POTASSIUM: 5.4 mmol/L — AB (ref 3.5–5.1)
Phosphorus: 5.5 mg/dL — ABNORMAL HIGH (ref 2.5–4.6)
Phosphorus: 4.7 mg/dL — ABNORMAL HIGH (ref 2.5–4.6)
Phosphorus: 4.8 mg/dL — ABNORMAL HIGH (ref 2.5–4.6)
Potassium: 5.8 mmol/L — ABNORMAL HIGH (ref 3.5–5.1)
Potassium: 5.2 mmol/L — ABNORMAL HIGH (ref 3.5–5.1)
Sodium: 137 mmol/L (ref 135–145)
Sodium: 134 mmol/L — ABNORMAL LOW (ref 135–145)
Sodium: 136 mmol/L (ref 135–145)

## 2018-11-08 LAB — POCT ACTIVATED CLOTTING TIME
ACTIVATED CLOTTING TIME: 158 s
Activated Clotting Time: 197 seconds
Activated Clotting Time: 153 seconds
Activated Clotting Time: 153 seconds
Activated Clotting Time: 158 seconds

## 2018-11-08 LAB — CBC
HCT: 24.8 % — ABNORMAL LOW (ref 39.0–52.0)
Hemoglobin: 7.9 g/dL — ABNORMAL LOW (ref 13.0–17.0)
MCH: 27.9 pg (ref 26.0–34.0)
MCHC: 31.9 g/dL (ref 30.0–36.0)
MCV: 87.6 fL (ref 80.0–100.0)
Platelets: 170 10*3/uL (ref 150–400)
RBC: 2.83 MIL/uL — ABNORMAL LOW (ref 4.22–5.81)
RDW: 16 % — ABNORMAL HIGH (ref 11.5–15.5)
WBC: 77.7 10*3/uL (ref 4.0–10.5)
nRBC: 0.6 % — ABNORMAL HIGH (ref 0.0–0.2)

## 2018-11-08 LAB — GLUCOSE, CAPILLARY
Glucose-Capillary: 136 mg/dL — ABNORMAL HIGH (ref 70–99)
Glucose-Capillary: 107 mg/dL — ABNORMAL HIGH (ref 70–99)
Glucose-Capillary: 112 mg/dL — ABNORMAL HIGH (ref 70–99)
Glucose-Capillary: 106 mg/dL — ABNORMAL HIGH (ref 70–99)
Glucose-Capillary: 155 mg/dL — ABNORMAL HIGH (ref 70–99)

## 2018-11-08 LAB — C DIFFICILE QUICK SCREEN W PCR REFLEX
C Diff antigen: NEGATIVE
C Diff interpretation: NOT DETECTED
C Diff toxin: NEGATIVE

## 2018-11-08 LAB — MAGNESIUM: Magnesium: 2.7 mg/dL — ABNORMAL HIGH (ref 1.7–2.4)

## 2018-11-08 LAB — APTT: aPTT: 39 seconds — ABNORMAL HIGH (ref 24–36)

## 2018-11-08 MED ORDER — TRACE MINERALS CR-CU-MN-SE-ZN 10-1000-500-60 MCG/ML IV SOLN
INTRAVENOUS | Status: AC
  Administered 2018-11-08: 18:00:00 via INTRAVENOUS
  Filled 2018-11-08: qty 936

## 2018-11-08 MED ORDER — PRISMASOL BGK 0/2.5 32-2.5 MEQ/L REPLACEMENT SOLN
Status: DC
  Administered 2018-11-08 – 2018-11-10 (×3): via INTRAVENOUS_CENTRAL
  Filled 2018-11-08 (×6): qty 5000

## 2018-11-08 MED ORDER — VASOPRESSIN 20 UNIT/ML IV SOLN
0.0300 [IU]/min | INTRAVENOUS | Status: DC
  Administered 2018-11-08 – 2018-11-11 (×5): 0.03 [IU]/min via INTRAVENOUS
  Filled 2018-11-08 (×4): qty 2

## 2018-11-08 MED ORDER — ETOMIDATE 2 MG/ML IV SOLN
20.0000 mg | Freq: Once | INTRAVENOUS | Status: AC
  Administered 2018-11-08: 20 mg via INTRAVENOUS

## 2018-11-08 MED ORDER — DEXTROSE 5 % IV SOLN
Status: DC
  Administered 2018-11-08 – 2018-11-15 (×12): via INTRAVENOUS_CENTRAL
  Filled 2018-11-08 (×20): qty 1500

## 2018-11-08 MED ORDER — INSULIN ASPART 100 UNIT/ML ~~LOC~~ SOLN
0.0000 [IU] | SUBCUTANEOUS | Status: DC
  Administered 2018-11-08: 3 [IU] via SUBCUTANEOUS
  Administered 2018-11-09: 8 [IU] via SUBCUTANEOUS
  Administered 2018-11-09: 5 [IU] via SUBCUTANEOUS
  Administered 2018-11-09: 11 [IU] via SUBCUTANEOUS
  Administered 2018-11-09 – 2018-11-10 (×4): 8 [IU] via SUBCUTANEOUS
  Administered 2018-11-10: 15 [IU] via SUBCUTANEOUS
  Administered 2018-11-10: 11 [IU] via SUBCUTANEOUS

## 2018-11-08 MED ORDER — ROCURONIUM BROMIDE 50 MG/5ML IV SOLN
60.0000 mg | Freq: Once | INTRAVENOUS | Status: AC
  Administered 2018-11-08: 60 mg via INTRAVENOUS

## 2018-11-08 MED ORDER — PRISMASOL B22GK 4/0 22-4 MEQ/L IV SOLN
INTRAVENOUS | Status: DC
  Administered 2018-11-08 – 2018-11-15 (×29): via INTRAVENOUS_CENTRAL
  Filled 2018-11-08 (×56): qty 5000

## 2018-11-08 MED ORDER — FLEET ENEMA 7-19 GM/118ML RE ENEM
1.0000 | ENEMA | Freq: Once | RECTAL | Status: AC
  Administered 2018-11-08: 1 via RECTAL
  Filled 2018-11-08: qty 1

## 2018-11-08 MED ORDER — DEXTROSE 5 % IV SOLN
20.0000 g | INTRAVENOUS | Status: DC
  Administered 2018-11-08 – 2018-11-15 (×10): 20 g via INTRAVENOUS_CENTRAL
  Filled 2018-11-08 (×15): qty 200

## 2018-11-08 MED ORDER — HEPARIN BOLUS VIA INFUSION (CRRT)
1000.0000 [IU] | INTRAVENOUS | Status: DC | PRN
  Filled 2018-11-08 (×2): qty 1000

## 2018-11-08 MED ORDER — SODIUM CHLORIDE 0.9 % IV SOLN
500.0000 [IU]/h | INTRAVENOUS | Status: DC
  Administered 2018-11-08: 500 [IU]/h via INTRAVENOUS_CENTRAL
  Filled 2018-11-08: qty 2

## 2018-11-08 NOTE — Procedures (Signed)
Admit: 10/11/2018 LOS: 11  28M AKI from ATN, severe pancreatitis, septic shock  Current CRRT Prescription: Start Date: Restart 11/30 Catheter: Temp HD Cath placed 11/22 CCM R IJ BFR: 200 Pre Blood Pump: 800 4K DFR: 1500 4K Replacement Rate: 300 4K  Start 0K today Goal UF: Net even Anticoagulation: add fixed dose heparin 12/1 Clotting: frequent  S: Remains on NE 50% FIO2 K creeping up No UOP Clotting frequently  O: 11/30 0701 - 12/01 0700 In: 3162.3 [I.V.:2681.2; IV Piggyback:461.1] Out: 2902 [Emesis/NG output:500]  Filed Weights   11/06/18 0704 11/07/18 0500 11/08/18 0400  Weight: 87 kg 88.1 kg 88.9 kg    Recent Labs  Lab 11/07/18 0408 11/07/18 1525 11/08/18 0452  NA 138 138 137  K 5.7* 5.4* 5.8*  CL 98 102 100  CO2 22 21* 21*  GLUCOSE 211* 188* 117*  BUN 117* 103* 75*  CREATININE 8.24* 6.75* 4.90*  CALCIUM 6.5* 6.0* 7.0*  PHOS 5.0* 5.0* 5.5*   Recent Labs  Lab 11/04/18 1131 11/05/18 0443 11/07/18 0408  WBC 37.4* 39.6* 62.0*  NEUTROABS  --  33.3*  --   HGB 8.3* 8.1* 8.1*  HCT 26.6* 25.4* 24.6*  MCV 91.4 87.3 85.1  PLT 116* 129* 149*    Scheduled Meds: . chlorhexidine gluconate (MEDLINE KIT)  15 mL Mouth Rinse BID  . heparin injection (subcutaneous)  5,000 Units Subcutaneous Q8H  . insulin aspart  0-20 Units Subcutaneous Q4H  . mouth rinse  15 mL Mouth Rinse 10 times per day  . pantoprazole (PROTONIX) IV  40 mg Intravenous Daily  . sodium chloride flush  10-40 mL Intracatheter Q12H   Continuous Infusions: .  prismasol BGK 4/2.5 300 mL/hr at 11/08/18 3474  .  prismasol BGK 4/2.5 800 mL/hr at 11/08/18 0233  . sodium chloride    . sodium chloride    . anidulafungin    . dexmedetomidine (PRECEDEX) IV infusion 1 mcg/kg/hr (11/08/18 0700)  . fentaNYL infusion INTRAVENOUS 400 mcg/hr (11/08/18 0700)  . meropenem (MERREM) IV Stopped (11/07/18 2230)  . norepinephrine (LEVOPHED) Adult infusion 28 mcg/min (11/08/18 0700)  . prismasol BGK 4/2.5 1,500  mL/hr at 11/08/18 0615  . sodium chloride    . TPN ADULT (ION) 50 mL/hr at 11/08/18 0700  . TPN ADULT (ION)     PRN Meds:.Place/Maintain arterial line **AND** sodium chloride, sodium chloride, acetaminophen, fentaNYL, heparin, heparin, midazolam, ondansetron (ZOFRAN) IV, promethazine, sodium chloride, sodium chloride flush  ABG    Component Value Date/Time   PHART 7.467 (H) 11/06/2018 1113   PCO2ART 34.4 11/06/2018 1113   PO2ART 72.0 (L) 11/06/2018 1113   HCO3 24.8 11/06/2018 1113   TCO2 26 11/06/2018 1113   ACIDBASEDEF 1.0 11/05/2018 1610   O2SAT 95.0 11/06/2018 1113    A/P  1. Dialysis dependent AKI from ATN, anuric; off CRRT 11/27, back on 11/30 2. Progressive hyperkalemia, probably from frequent clotting on dialyzer 3. Severe pancreatitis; on meropenem 4. Septic shock on NE; recurrent 5. Metabolic Acidosis;  6. Hypocalcemia; stable on 2.5Ca bath; corrects 2/2 hypoalbuminemia 7. Anemia 8. VDRF, extubated 11/26, reintubated 11/29; hypoxic; probably trach 9. AMS 10. Temp HD cath present since 11/22  Add fixed dose heparin 500u/hr, PTT this PM and tomorrow AM.  Change post fluid to 0K.  Follow labs.   Pearson Grippe, MD [facility] pgr [phone]

## 2018-11-08 NOTE — Procedures (Signed)
Bronchoscopy Procedure Note Derek Riley [id number] [birth year]  Procedure: Bronchoscopy Indications: trach placement   Procedure Details Consent: Risks of procedure as well as the alternatives and risks of each were explained to the (patient/caregiver).  Consent for procedure obtained. Time Out: Verified patient identification, verified procedure, site/side was marked, verified correct patient position, special equipment/implants available, medications/allergies/relevent history reviewed, required imaging and test results available.  Procedure done by P Terrance Usery ACNP-BC, under direct supervision of Dr Nelda Marseille. At first bronch was introduce through ET tube and structures of tracheal rings, carina identified for operator of tracheostomy who was Dr Nelda Marseille. Light of bronch passed through trachea and skin for indentification of tracheal rings for tracheostomy puncture. After this, under bronchoscopy guidance,  ET tube was pulled back sufficiently and very carefully. The ET tube was  pulled back enough to give room for tracheostomy operator and yet at same time to to ensure a secured airway. After this was accomplished, bronchoscope was withdrawn into the ET tube. After this,  Dr Nelda Marseille then performed tracheostomy under video visual provided by flexible video bronchoscopy. Followng introduction of tracheostomy,  the bronchoscope was removed from ET tube and introduced through tracheostomy. Correct position of tracheostomy was ensured, with enough room between carina and distal tracheostomy and no evidence of bleeding. The bronchoscope was then withdrawn. Respiratory therapist was then instructed to remove the ET tube.  Dr Nelda Marseille  then proceeded to complete the tracheostomy with stay sutures   No complications   Erick Colace ACNP-BC [place] Pager # [phone] OR # [phone] if no answer

## 2018-11-08 NOTE — Progress Notes (Signed)
[place] Progress Note Patient Name: COLBIE DANNER DOB: August 12, 1951 MRN: [id number]   Date of Service  11/08/2018  HPI/Events of Note  Asked to evaluate patient for possible cuff leak, on camera in no leak by auscultation of upper airway. Tidal volumes stable. Provocative testing with ET tube suction failed to demonstrate a leak. RT reports thick secretions.  eICU Interventions  I recommended a humidified circuit which RT will try to get for patient. No immediate indication for ET tube exchange at this time. RN will call if evidence of a leak recurs.        Selestino Nila U Tashima Scarpulla 11/08/2018, 1:00 AM

## 2018-11-08 NOTE — Progress Notes (Addendum)
NAME:  Derek Riley, MRN:  [id number], DOB:  [birth year], LOS: 76 ADMISSION DATE:  11/06/2018, CONSULTATION DATE:  11/22 REFERRING MD:  11/22, CHIEF COMPLAINT:  Worsening metabolic acidosis and renal failure w/ clinical decline  Brief History   67 year old male admitted 11/20 w/ gallstone pancreatitis. PCCM consulted 11/22 for progressive metabolic acidosis, acute renal failure and persistent clinical decline.   Past Medical History  Diabetes, gallstones, hypertension.  Significant Hospital Events   11/20 transferred to [facility] from [place] w/ acute gallstone pancreatitis. GI & surgicals services consulted. Started On supportive IVFs, empiric zosyn and analgesic support.  Surgical service is planning on cholecystectomy once pancreatitis improved.  Initial lipase on arrival to [place], creatinine 1.72 11/21: MRCP 11/21 showing extensive pancreatitis, possible hemorrhagic peripancreatic fluid no choledocholithiasis, lipase 3361, creatinine up to 4.91, anion gap 13.  No role for ERCP continued supportive care 11/22: Creatinine up to 6.95, gap now 16, progressive diffuse mottling, worse pain, critical care consulted for shock, and progressive multiorgan failure, moving to intensive care for CRRT as well as hemodynamic support. 3.7 liters + since admit, developed worsening resp failure requiring intubation shortly after arrival. Possible aspiration during intubation  11/26 started on meropenem (d/t rising CBC) CRRT stopped 11/27 extubated, pressors off.  11/28 look weaker. WIll probably need re-intubation. 11/29 reintubated for increasing WOB, likely aspiration even overnight.  11/30: Remains intubated.  White blood cell count now up to 62,000.  CRRT resumed 12/1: No significant change.  Ongoing cuff leak opting for early tracheostomy versus endotracheal tube change.  Consults:  11/20 GI and surgical services 11/22 critical care  Procedures:  11/22 Foley catheter 11/22 right IJ CVL  (HD)>>> 11/22 OETT 11/22>>>  Significant Diagnostic Tests:  MRCP 11/21: Acute pancreatitis with extensive pancreatic and periPancreatic inflammatory changes, including proteinaceous and/or hemorrhagic peri-pancreatic fluid.  No choledocholithiasis or biliary tract obstruction.  Small volume of ascites, trace bilateral pleural effusions.  Bilateral basilar volume loss  Micro Data:  Cultures negative Antimicrobials:  Zosyn 11/20>>>11/26 Meropenem 11/26>>>  Interim history/subjective:  Remains on full ventilator support requiring sedation  Objective   Blood pressure (Abnormal) 135/59, pulse 72, temperature 100 F (37.8 C), temperature source Axillary, resp. rate 14, height 6' (1.829 m), weight 88.9 kg, SpO2 99 %.    Vent Mode: PRVC FiO2 (%):  [50 %] 50 % Set Rate:  [18 bmp] 18 bmp Vt Set:  [620 mL] 620 mL PEEP:  [10 cmH20] 10 cmH20 Plateau Pressure:  [21 cmH20-33 cmH20] 25 cmH20   Intake/Output Summary (Last 24 hours) at 11/08/2018 0854 Last data filed at 11/08/2018 0800 Gross per 24 hour  Intake 3150.75 ml  Output 2932 ml  Net 218.75 ml   Filed Weights   11/06/18 0704 11/07/18 0500 11/08/18 0400  Weight: 87 kg 88.1 kg 88.9 kg    Examination: General: This is a critically ill 67 year old male patient who remains on full ventilator support. HEENT normocephalic atraumatic no jugular venous distention does have mildly icteric sclera.  He has a #8 tracheostomy tube, has ongoing cuff leak requiring re-instillation of air almost hourly Pulmonary: Scattered rhonchi no accessory use except when awake, then becomes quite tachypneic Cardiac: Regular rate and rhythm without murmur rub or gallop Abdomen: Remains firm.  Bowel sounds hypoactive.  Remains tender. GU: Minimal urine output Neuro: He is agitated when awake, moves all extremities.  No focal deficits  Resolved Hospital Problem list   Distributive shock   Assessment & Plan:   Acute  gallstone pancreatitis development of  creatinine necrosis -No evidence of choledocholelithiasis on MRCP Plan Continuing n.p.o. status  Continuing TPN  Day #5 meropenem empirically started for necrotic pancreas and concern for secondary infection also cross covering for aspiration event  PRN analgesia  Eventually needs cholecystectomy   Ileus Plan Flatplate abdomen today Fleets enema  Ongoing leukocytosis in setting of pancreatic inflammation Plan Repeat CBC this a.m. C. difficile pending   Acute hypoxic respiratory failure requiring mechanical ventilation in the setting of acute aspiration event 11/29, progressive atelectasis, and decreased abdominal compliance from pain and impaired cough Portable chest x-ray from 1/30: This demonstrated worsening right basilar airspace disease most likely representing atelectasis plus minus aspiration event He has an ongoing cuff leak, spoke with family they are agreeable to tracheostomy today Plan Continuing PAD protocol Change RASS to 0 to -1 following trach Full ventilator support Daily assessment for spontaneous breathing trial following trach Wean oxygen VAP bundle Pete chest x-ray a.m.  Severe deconditioning and protein calorie malnutrition Plan Continue TPN Will need LTAC  Acute kidney injury with baseline creatinine 1.  This was hemodynamically mediated plus/minus contrast-induced nephropathy Plan Nephrology following Continuing CRRT Follow-up chemistries  Ongoing hypotension: Appears to be drug related from narcotics Plan Norepinephrine for mean arterial pressure goal greater than 65  Hyperglycemia Plan Sliding scale insulin  Intermittent fluid and electrolyte imbalance: Hyperkalemia Plan Monitor and replace as indicated Dialysate adjusted for rising potassium Best practice:  Diet: NPO - TPN Pain/Anxiety/Delirium protocol (if indicated): fent /precedex  VAP protocol (if indicated):  bundle DVT prophylaxis: change from LMWH to [facility] heparin 11/22 GI  prophylaxis: PPI (active reflux) Glucose control: ssi and insulin tpn. Mobility: bed rest Code Status: full code Family Communication: Brother updated via phone Disposition: ICU mains critically ill due to ongoing need for mechanical ventilation, titration of sedation drips, titration of hemodynamic drips, close observation of metabolic derangements.  Critical care time: 35 minutes    Attending Note:  67 year old male with gallstone pancreatitis who presents to PCCM with respiratory failure due AMS and sepsis with shock and renal failure.  On exam, patient is responsive but severely altered with coarse BS diffusely.  I reviewed CXR myself, ETT is in a good position, infiltrate noted.  Discussed with PCCM-NP.  Will proceed with tracheostomy today as patient is having a cuff leak this morning.  Hold off weaning efforts today.  Will proceed with weaning in AM.  Continue CRRT for now.  Pressors for BP support.  PCCM will continue to follow.  The patient is critically ill with multiple organ systems failure and requires high complexity decision making for assessment and support, frequent evaluation and titration of therapies, application of advanced monitoring technologies and extensive interpretation of multiple databases.   Critical Care Time devoted to patient care services described in this note is  34  Minutes. This time reflects time of care of this signee Dr Jennet Maduro. This critical care time does not reflect procedure time, or teaching time or supervisory time of PA/NP/Med student/Med Resident etc but could involve care discussion time.  Rush Farmer, M.D. [facility] Pulmonary/Critical Care Medicine. Pager: [phone]. After hours pager: [phone].

## 2018-11-08 NOTE — Progress Notes (Signed)
PHARMACY - ADULT TOTAL PARENTERAL NUTRITION CONSULT NOTE   Pharmacy Consult:  TPN Indication: Necrotizing pancreatitis  Patient Measurements: Height: 6' (182.9 cm) Weight: 195 lb 15.8 oz (88.9 kg) IBW/kg (Calculated) : 77.6 TPN AdjBW (KG): 87.4 Body mass index is 26.58 kg/m. Usual Weight: 88 kg  Assessment:  68 YOM with PMH of DM, gallstones, and HTN. Originally admitted to [facility] on 11/20 after transfer d/t acute gallstone pancreatitis. ERCP on 10/29/18 showed extensive pancreatitis and possible hemorrhagic peripancreatic fluid. Patient was moved to ICU on 11/22 and received CRRT unitl 11/27. CT abdomen 11/26 showed necrotizing pancreatitis. NGT placed overnight 11/27 and immediately put out > 500 mL.  Patient decompensated 11/06/18 requiring reintubation and restart of pressor; CRRT restarted 11/07/18.  GI: pending cholecystectomy when pancreatitis has resolved.  Baseline prealbumin < 5, NG O/P 54m (down), PPI IV, PRN Zofran Endo: DM on metformin PTA.  CBGs uncontrolled with TPN initiation, now tightly controlled with insulin in TPN Insulin requirements in the past 24 hours: 19 units SSI + 35 units in TPN Lytes: K / Mag / Phos are elevated, CoCa low normal at 8.56, low CO2 Renal: severe AKI from ATN, s/p 5d CRRT >> iHD but didn't tolerate on 11/29 d/t hypotension, CRRT restarted 11/30.  Net +7L Pulm: intubated 11/22>>11/26, reintubated 11/29 - FiO2 50% Cards: MAP 50-60s, back on pressor, Levophed increased to 28 Hepatobil: AST/alk phos trending up, ALT WNL, tbili elevated 4 (jaundice per RN), TG elevated at 219 (ILE 25% of total kCal) Neuro: Fentanyl at 400 + Precedex at 1, PRN Dilaudid/Versed/APAP, off Seroquel - GCS 11, CPOT 0-5, RASS 1 ID: Merrem/Eraxis for necrotizing pancreatitis, s/p Zosyn - Tmax 100, WBC up to 62, LA 1.6, PCT 6.25 TPN Access: HD cath pigtail + CVC triple lumen placed 12/06/18 TPN start date: 11/05/18  Nutritional Goals (per RD rec on 11/29): ICU needs: 2189  kCal, 130-145gm protein per day Non-ICU needs: 2300 - 2600, > 135gm protein per day  Current Nutrition:  TPN  Plan:  Increase concentrated TPN to goal rate of 75 ml/hr.  Continue with lipid since already on it. TPN will provide 140g AA, 315g CHO, and 55g ILE for a total of 2182 kCal per day, meeting ~100% of patient's needs Electrolytes in TPN: none except Na and Ca, max acetate Full dose multivitamin and half-dose trace elements in TPN d/t jaundice Reduce SSI to moderate Q4H and continue with 35 units regular insulin in TPN (increase CHO in TPN) F/U AM labs, CBGs, TG, prealbumin trend/CRRT LOT to increase protein provision if needed (currently providing ~1.6 g/kg/d)   Derek Riley D. DMina Marble PharmD, BCPS, [place]12/12/2017, 8:01 AM

## 2018-11-08 NOTE — Procedures (Signed)
Percutaneous Tracheostomy Placement  Consent from family.  Patient sedated, paralyzed and position.  Placed on 100% FiO2 and RR matched.  Area cleaned and draped.  Lidocaine/epi injected.  Skin incision done followed by blunt dissection.  Trachea palpated then punctured, catheter passed and visualized bronchoscopically.  Wire placed and visualized.  Catheter removed.  Airway then entered and dilated.  Size 6 cuffed shiley trach placed and visualized bronchoscopically well above carina.  Good volume returns.  Patient tolerated the procedure well without complications.  Minimal blood loss.  CXR ordered and pending.  Wesam G. Yacoub, M.D. [place] Pulmonary/Critical Care Medicine. Pager: [phone]. After hours pager: [phone].  

## 2018-11-08 DEATH — deceased

## 2018-11-09 ENCOUNTER — Inpatient Hospital Stay (HOSPITAL_COMMUNITY): Payer: Medicare Other

## 2018-11-09 LAB — POCT I-STAT, CHEM 8
BUN: 36 mg/dL — ABNORMAL HIGH (ref 8–23)
BUN: 37 mg/dL — ABNORMAL HIGH (ref 8–23)
BUN: 38 mg/dL — ABNORMAL HIGH (ref 8–23)
BUN: 39 mg/dL — ABNORMAL HIGH (ref 8–23)
BUN: 40 mg/dL — AB (ref 8–23)
BUN: 40 mg/dL — ABNORMAL HIGH (ref 8–23)
BUN: 42 mg/dL — ABNORMAL HIGH (ref 8–23)
BUN: 42 mg/dL — ABNORMAL HIGH (ref 8–23)
BUN: 43 mg/dL — ABNORMAL HIGH (ref 8–23)
BUN: 44 mg/dL — AB (ref 8–23)
BUN: 44 mg/dL — ABNORMAL HIGH (ref 8–23)
BUN: 45 mg/dL — ABNORMAL HIGH (ref 8–23)
BUN: 45 mg/dL — ABNORMAL HIGH (ref 8–23)
BUN: 45 mg/dL — ABNORMAL HIGH (ref 8–23)
BUN: 47 mg/dL — ABNORMAL HIGH (ref 8–23)
BUN: 49 mg/dL — ABNORMAL HIGH (ref 8–23)
BUN: 50 mg/dL — ABNORMAL HIGH (ref 8–23)
BUN: 52 mg/dL — ABNORMAL HIGH (ref 8–23)
BUN: 57 mg/dL — ABNORMAL HIGH (ref 8–23)
BUN: 57 mg/dL — ABNORMAL HIGH (ref 8–23)
BUN: 59 mg/dL — ABNORMAL HIGH (ref 8–23)
CALCIUM ION: 1 mmol/L — AB (ref 1.15–1.40)
CALCIUM ION: 1.03 mmol/L — AB (ref 1.15–1.40)
Calcium, Ion: 0.41 mmol/L — CL (ref 1.15–1.40)
Calcium, Ion: 0.41 mmol/L — CL (ref 1.15–1.40)
Calcium, Ion: 0.42 mmol/L — CL (ref 1.15–1.40)
Calcium, Ion: 0.42 mmol/L — CL (ref 1.15–1.40)
Calcium, Ion: 0.42 mmol/L — CL (ref 1.15–1.40)
Calcium, Ion: 0.43 mmol/L — CL (ref 1.15–1.40)
Calcium, Ion: 0.43 mmol/L — CL (ref 1.15–1.40)
Calcium, Ion: 0.43 mmol/L — CL (ref 1.15–1.40)
Calcium, Ion: 0.44 mmol/L — CL (ref 1.15–1.40)
Calcium, Ion: 0.44 mmol/L — CL (ref 1.15–1.40)
Calcium, Ion: 0.89 mmol/L — CL (ref 1.15–1.40)
Calcium, Ion: 0.89 mmol/L — CL (ref 1.15–1.40)
Calcium, Ion: 0.89 mmol/L — CL (ref 1.15–1.40)
Calcium, Ion: 0.9 mmol/L — ABNORMAL LOW (ref 1.15–1.40)
Calcium, Ion: 0.92 mmol/L — ABNORMAL LOW (ref 1.15–1.40)
Calcium, Ion: 0.93 mmol/L — ABNORMAL LOW (ref 1.15–1.40)
Calcium, Ion: 0.93 mmol/L — ABNORMAL LOW (ref 1.15–1.40)
Calcium, Ion: 1.02 mmol/L — ABNORMAL LOW (ref 1.15–1.40)
Calcium, Ion: 1.03 mmol/L — ABNORMAL LOW (ref 1.15–1.40)
Chloride: 89 mmol/L — ABNORMAL LOW (ref 98–111)
Chloride: 89 mmol/L — ABNORMAL LOW (ref 98–111)
Chloride: 90 mmol/L — ABNORMAL LOW (ref 98–111)
Chloride: 90 mmol/L — ABNORMAL LOW (ref 98–111)
Chloride: 91 mmol/L — ABNORMAL LOW (ref 98–111)
Chloride: 91 mmol/L — ABNORMAL LOW (ref 98–111)
Chloride: 91 mmol/L — ABNORMAL LOW (ref 98–111)
Chloride: 92 mmol/L — ABNORMAL LOW (ref 98–111)
Chloride: 92 mmol/L — ABNORMAL LOW (ref 98–111)
Chloride: 92 mmol/L — ABNORMAL LOW (ref 98–111)
Chloride: 93 mmol/L — ABNORMAL LOW (ref 98–111)
Chloride: 94 mmol/L — ABNORMAL LOW (ref 98–111)
Chloride: 94 mmol/L — ABNORMAL LOW (ref 98–111)
Chloride: 95 mmol/L — ABNORMAL LOW (ref 98–111)
Chloride: 95 mmol/L — ABNORMAL LOW (ref 98–111)
Chloride: 96 mmol/L — ABNORMAL LOW (ref 98–111)
Chloride: 96 mmol/L — ABNORMAL LOW (ref 98–111)
Chloride: 97 mmol/L — ABNORMAL LOW (ref 98–111)
Chloride: 98 mmol/L (ref 98–111)
Chloride: 98 mmol/L (ref 98–111)
Chloride: 99 mmol/L (ref 98–111)
Creatinine, Ser: 2.6 mg/dL — ABNORMAL HIGH (ref 0.61–1.24)
Creatinine, Ser: 2.7 mg/dL — ABNORMAL HIGH (ref 0.61–1.24)
Creatinine, Ser: 2.7 mg/dL — ABNORMAL HIGH (ref 0.61–1.24)
Creatinine, Ser: 2.8 mg/dL — ABNORMAL HIGH (ref 0.61–1.24)
Creatinine, Ser: 2.9 mg/dL — ABNORMAL HIGH (ref 0.61–1.24)
Creatinine, Ser: 3 mg/dL — ABNORMAL HIGH (ref 0.61–1.24)
Creatinine, Ser: 3.1 mg/dL — ABNORMAL HIGH (ref 0.61–1.24)
Creatinine, Ser: 3.3 mg/dL — ABNORMAL HIGH (ref 0.61–1.24)
Creatinine, Ser: 3.3 mg/dL — ABNORMAL HIGH (ref 0.61–1.24)
Creatinine, Ser: 3.3 mg/dL — ABNORMAL HIGH (ref 0.61–1.24)
Creatinine, Ser: 3.3 mg/dL — ABNORMAL HIGH (ref 0.61–1.24)
Creatinine, Ser: 3.5 mg/dL — ABNORMAL HIGH (ref 0.61–1.24)
Creatinine, Ser: 3.5 mg/dL — ABNORMAL HIGH (ref 0.61–1.24)
Creatinine, Ser: 3.5 mg/dL — ABNORMAL HIGH (ref 0.61–1.24)
Creatinine, Ser: 3.6 mg/dL — ABNORMAL HIGH (ref 0.61–1.24)
Creatinine, Ser: 3.7 mg/dL — ABNORMAL HIGH (ref 0.61–1.24)
Creatinine, Ser: 4 mg/dL — ABNORMAL HIGH (ref 0.61–1.24)
Creatinine, Ser: 4.1 mg/dL — ABNORMAL HIGH (ref 0.61–1.24)
Creatinine, Ser: 4.2 mg/dL — ABNORMAL HIGH (ref 0.61–1.24)
Creatinine, Ser: 4.5 mg/dL — ABNORMAL HIGH (ref 0.61–1.24)
Creatinine, Ser: 4.6 mg/dL — ABNORMAL HIGH (ref 0.61–1.24)
GLUCOSE: 298 mg/dL — AB (ref 70–99)
GLUCOSE: 267 mg/dL — AB (ref 70–99)
GLUCOSE: 279 mg/dL — AB (ref 70–99)
Glucose, Bld: 274 mg/dL — ABNORMAL HIGH (ref 70–99)
Glucose, Bld: 280 mg/dL — ABNORMAL HIGH (ref 70–99)
Glucose, Bld: 283 mg/dL — ABNORMAL HIGH (ref 70–99)
Glucose, Bld: 285 mg/dL — ABNORMAL HIGH (ref 70–99)
Glucose, Bld: 290 mg/dL — ABNORMAL HIGH (ref 70–99)
Glucose, Bld: 293 mg/dL — ABNORMAL HIGH (ref 70–99)
Glucose, Bld: 302 mg/dL — ABNORMAL HIGH (ref 70–99)
Glucose, Bld: 304 mg/dL — ABNORMAL HIGH (ref 70–99)
Glucose, Bld: 306 mg/dL — ABNORMAL HIGH (ref 70–99)
Glucose, Bld: 306 mg/dL — ABNORMAL HIGH (ref 70–99)
Glucose, Bld: 309 mg/dL — ABNORMAL HIGH (ref 70–99)
Glucose, Bld: 316 mg/dL — ABNORMAL HIGH (ref 70–99)
Glucose, Bld: 317 mg/dL — ABNORMAL HIGH (ref 70–99)
Glucose, Bld: 317 mg/dL — ABNORMAL HIGH (ref 70–99)
Glucose, Bld: 318 mg/dL — ABNORMAL HIGH (ref 70–99)
Glucose, Bld: 322 mg/dL — ABNORMAL HIGH (ref 70–99)
Glucose, Bld: 362 mg/dL — ABNORMAL HIGH (ref 70–99)
Glucose, Bld: 363 mg/dL — ABNORMAL HIGH (ref 70–99)
HCT: 23 % — ABNORMAL LOW (ref 39.0–52.0)
HCT: 23 % — ABNORMAL LOW (ref 39.0–52.0)
HCT: 24 % — ABNORMAL LOW (ref 39.0–52.0)
HCT: 24 % — ABNORMAL LOW (ref 39.0–52.0)
HCT: 24 % — ABNORMAL LOW (ref 39.0–52.0)
HCT: 25 % — ABNORMAL LOW (ref 39.0–52.0)
HCT: 25 % — ABNORMAL LOW (ref 39.0–52.0)
HCT: 25 % — ABNORMAL LOW (ref 39.0–52.0)
HCT: 25 % — ABNORMAL LOW (ref 39.0–52.0)
HCT: 26 % — ABNORMAL LOW (ref 39.0–52.0)
HCT: 26 % — ABNORMAL LOW (ref 39.0–52.0)
HCT: 27 % — ABNORMAL LOW (ref 39.0–52.0)
HCT: 27 % — ABNORMAL LOW (ref 39.0–52.0)
HCT: 27 % — ABNORMAL LOW (ref 39.0–52.0)
HCT: 27 % — ABNORMAL LOW (ref 39.0–52.0)
HCT: 27 % — ABNORMAL LOW (ref 39.0–52.0)
HCT: 27 % — ABNORMAL LOW (ref 39.0–52.0)
HCT: 27 % — ABNORMAL LOW (ref 39.0–52.0)
HCT: 28 % — ABNORMAL LOW (ref 39.0–52.0)
HCT: 28 % — ABNORMAL LOW (ref 39.0–52.0)
HEMATOCRIT: 26 % — AB (ref 39.0–52.0)
HEMOGLOBIN: 8.5 g/dL — AB (ref 13.0–17.0)
HEMOGLOBIN: 8.2 g/dL — AB (ref 13.0–17.0)
HEMOGLOBIN: 8.2 g/dL — AB (ref 13.0–17.0)
Hemoglobin: 7.8 g/dL — ABNORMAL LOW (ref 13.0–17.0)
Hemoglobin: 7.8 g/dL — ABNORMAL LOW (ref 13.0–17.0)
Hemoglobin: 8.2 g/dL — ABNORMAL LOW (ref 13.0–17.0)
Hemoglobin: 8.5 g/dL — ABNORMAL LOW (ref 13.0–17.0)
Hemoglobin: 8.5 g/dL — ABNORMAL LOW (ref 13.0–17.0)
Hemoglobin: 8.5 g/dL — ABNORMAL LOW (ref 13.0–17.0)
Hemoglobin: 8.8 g/dL — ABNORMAL LOW (ref 13.0–17.0)
Hemoglobin: 8.8 g/dL — ABNORMAL LOW (ref 13.0–17.0)
Hemoglobin: 8.8 g/dL — ABNORMAL LOW (ref 13.0–17.0)
Hemoglobin: 9.2 g/dL — ABNORMAL LOW (ref 13.0–17.0)
Hemoglobin: 9.2 g/dL — ABNORMAL LOW (ref 13.0–17.0)
Hemoglobin: 9.2 g/dL — ABNORMAL LOW (ref 13.0–17.0)
Hemoglobin: 9.2 g/dL — ABNORMAL LOW (ref 13.0–17.0)
Hemoglobin: 9.2 g/dL — ABNORMAL LOW (ref 13.0–17.0)
Hemoglobin: 9.2 g/dL — ABNORMAL LOW (ref 13.0–17.0)
Hemoglobin: 9.2 g/dL — ABNORMAL LOW (ref 13.0–17.0)
Hemoglobin: 9.5 g/dL — ABNORMAL LOW (ref 13.0–17.0)
Hemoglobin: 9.5 g/dL — ABNORMAL LOW (ref 13.0–17.0)
POTASSIUM: 4.7 mmol/L (ref 3.5–5.1)
POTASSIUM: 4.6 mmol/L (ref 3.5–5.1)
POTASSIUM: 4.3 mmol/L (ref 3.5–5.1)
POTASSIUM: 4.2 mmol/L (ref 3.5–5.1)
Potassium: 3.8 mmol/L (ref 3.5–5.1)
Potassium: 3.9 mmol/L (ref 3.5–5.1)
Potassium: 3.9 mmol/L (ref 3.5–5.1)
Potassium: 4 mmol/L (ref 3.5–5.1)
Potassium: 4 mmol/L (ref 3.5–5.1)
Potassium: 4 mmol/L (ref 3.5–5.1)
Potassium: 4.1 mmol/L (ref 3.5–5.1)
Potassium: 4.1 mmol/L (ref 3.5–5.1)
Potassium: 4.2 mmol/L (ref 3.5–5.1)
Potassium: 4.2 mmol/L (ref 3.5–5.1)
Potassium: 4.2 mmol/L (ref 3.5–5.1)
Potassium: 4.5 mmol/L (ref 3.5–5.1)
Potassium: 4.5 mmol/L (ref 3.5–5.1)
Potassium: 4.5 mmol/L (ref 3.5–5.1)
Potassium: 4.6 mmol/L (ref 3.5–5.1)
Potassium: 4.8 mmol/L (ref 3.5–5.1)
Potassium: 4.9 mmol/L (ref 3.5–5.1)
SODIUM: 135 mmol/L (ref 135–145)
Sodium: 132 mmol/L — ABNORMAL LOW (ref 135–145)
Sodium: 132 mmol/L — ABNORMAL LOW (ref 135–145)
Sodium: 133 mmol/L — ABNORMAL LOW (ref 135–145)
Sodium: 133 mmol/L — ABNORMAL LOW (ref 135–145)
Sodium: 133 mmol/L — ABNORMAL LOW (ref 135–145)
Sodium: 133 mmol/L — ABNORMAL LOW (ref 135–145)
Sodium: 134 mmol/L — ABNORMAL LOW (ref 135–145)
Sodium: 134 mmol/L — ABNORMAL LOW (ref 135–145)
Sodium: 134 mmol/L — ABNORMAL LOW (ref 135–145)
Sodium: 134 mmol/L — ABNORMAL LOW (ref 135–145)
Sodium: 135 mmol/L (ref 135–145)
Sodium: 135 mmol/L (ref 135–145)
Sodium: 135 mmol/L (ref 135–145)
Sodium: 136 mmol/L (ref 135–145)
Sodium: 136 mmol/L (ref 135–145)
Sodium: 136 mmol/L (ref 135–145)
Sodium: 136 mmol/L (ref 135–145)
Sodium: 136 mmol/L (ref 135–145)
Sodium: 136 mmol/L (ref 135–145)
Sodium: 136 mmol/L (ref 135–145)
TCO2: 24 mmol/L (ref 22–32)
TCO2: 24 mmol/L (ref 22–32)
TCO2: 24 mmol/L (ref 22–32)
TCO2: 25 mmol/L (ref 22–32)
TCO2: 25 mmol/L (ref 22–32)
TCO2: 26 mmol/L (ref 22–32)
TCO2: 26 mmol/L (ref 22–32)
TCO2: 26 mmol/L (ref 22–32)
TCO2: 26 mmol/L (ref 22–32)
TCO2: 26 mmol/L (ref 22–32)
TCO2: 26 mmol/L (ref 22–32)
TCO2: 26 mmol/L (ref 22–32)
TCO2: 26 mmol/L (ref 22–32)
TCO2: 26 mmol/L (ref 22–32)
TCO2: 27 mmol/L (ref 22–32)
TCO2: 27 mmol/L (ref 22–32)
TCO2: 27 mmol/L (ref 22–32)
TCO2: 27 mmol/L (ref 22–32)
TCO2: 27 mmol/L (ref 22–32)
TCO2: 27 mmol/L (ref 22–32)
TCO2: 28 mmol/L (ref 22–32)

## 2018-11-09 LAB — DIFFERENTIAL
Abs Immature Granulocytes: 5.25 10*3/uL — ABNORMAL HIGH (ref 0.00–0.07)
Basophils Absolute: 0.1 10*3/uL (ref 0.0–0.1)
Basophils Relative: 0 %
EOS ABS: 0 10*3/uL (ref 0.0–0.5)
Eosinophils Relative: 0 %
Immature Granulocytes: 8 %
Lymphocytes Relative: 6 %
Lymphs Abs: 3.8 10*3/uL (ref 0.7–4.0)
Monocytes Absolute: 13 10*3/uL — ABNORMAL HIGH (ref 0.1–1.0)
Monocytes Relative: 19 %
Neutro Abs: 46.8 10*3/uL — ABNORMAL HIGH (ref 1.7–7.7)
Neutrophils Relative %: 67 %

## 2018-11-09 LAB — RENAL FUNCTION PANEL
Albumin: 1.2 g/dL — ABNORMAL LOW (ref 3.5–5.0)
Anion gap: 18 — ABNORMAL HIGH (ref 5–15)
BUN: 46 mg/dL — ABNORMAL HIGH (ref 8–23)
CO2: 25 mmol/L (ref 22–32)
Calcium: 8.3 mg/dL — ABNORMAL LOW (ref 8.9–10.3)
Chloride: 89 mmol/L — ABNORMAL LOW (ref 98–111)
Creatinine, Ser: 3.1 mg/dL — ABNORMAL HIGH (ref 0.61–1.24)
GFR calc non Af Amer: 20 mL/min — ABNORMAL LOW (ref >60)
GFR, EST AFRICAN AMERICAN: 23 mL/min — AB (ref >60)
Glucose, Bld: 365 mg/dL — ABNORMAL HIGH (ref 70–99)
Phosphorus: 3.7 mg/dL (ref 2.5–4.6)
Potassium: 4.2 mmol/L (ref 3.5–5.1)
Sodium: 132 mmol/L — ABNORMAL LOW (ref 135–145)

## 2018-11-09 LAB — COMPREHENSIVE METABOLIC PANEL
ALT: 15 U/L (ref 0–44)
AST: 61 U/L — ABNORMAL HIGH (ref 15–41)
Albumin: 1.2 g/dL — ABNORMAL LOW (ref 3.5–5.0)
Alkaline Phosphatase: 175 U/L — ABNORMAL HIGH (ref 38–126)
Anion gap: 13 (ref 5–15)
BILIRUBIN TOTAL: 4 mg/dL — AB (ref 0.3–1.2)
BUN: 59 mg/dL — AB (ref 8–23)
CO2: 26 mmol/L (ref 22–32)
Calcium: 7.3 mg/dL — ABNORMAL LOW (ref 8.9–10.3)
Chloride: 94 mmol/L — ABNORMAL LOW (ref 98–111)
Creatinine, Ser: 4.08 mg/dL — ABNORMAL HIGH (ref 0.61–1.24)
GFR calc Af Amer: 16 mL/min — ABNORMAL LOW (ref >60)
GFR calc non Af Amer: 14 mL/min — ABNORMAL LOW (ref >60)
Glucose, Bld: 305 mg/dL — ABNORMAL HIGH (ref 70–99)
Potassium: 4.6 mmol/L (ref 3.5–5.1)
Sodium: 133 mmol/L — ABNORMAL LOW (ref 135–145)
TOTAL PROTEIN: 6.3 g/dL — AB (ref 6.5–8.1)

## 2018-11-09 LAB — CBC
HCT: 22.6 % — ABNORMAL LOW (ref 39.0–52.0)
Hemoglobin: 7.3 g/dL — ABNORMAL LOW (ref 13.0–17.0)
MCH: 28.1 pg (ref 26.0–34.0)
MCHC: 32.3 g/dL (ref 30.0–36.0)
MCV: 86.9 fL (ref 80.0–100.0)
Platelets: 165 10*3/uL (ref 150–400)
RBC: 2.6 MIL/uL — ABNORMAL LOW (ref 4.22–5.81)
RDW: 16.2 % — ABNORMAL HIGH (ref 11.5–15.5)
WBC: 68.9 10*3/uL (ref 4.0–10.5)
nRBC: 0.5 % — ABNORMAL HIGH (ref 0.0–0.2)

## 2018-11-09 LAB — GLUCOSE, CAPILLARY
Glucose-Capillary: 247 mg/dL — ABNORMAL HIGH (ref 70–99)
Glucose-Capillary: 263 mg/dL — ABNORMAL HIGH (ref 70–99)
Glucose-Capillary: 265 mg/dL — ABNORMAL HIGH (ref 70–99)
Glucose-Capillary: 276 mg/dL — ABNORMAL HIGH (ref 70–99)
Glucose-Capillary: 284 mg/dL — ABNORMAL HIGH (ref 70–99)
Glucose-Capillary: 288 mg/dL — ABNORMAL HIGH (ref 70–99)
Glucose-Capillary: 327 mg/dL — ABNORMAL HIGH (ref 70–99)

## 2018-11-09 LAB — APTT: aPTT: 33 s (ref 24–36)

## 2018-11-09 LAB — PREALBUMIN: Prealbumin: <5 mg/dL — ABNORMAL LOW (ref 18–38)

## 2018-11-09 LAB — PHOSPHORUS: Phosphorus: 4 mg/dL (ref 2.5–4.6)

## 2018-11-09 LAB — TRIGLYCERIDES: Triglycerides: 138 mg/dL (ref <150)

## 2018-11-09 MED ORDER — TRACE MINERALS CR-CU-MN-SE-ZN 10-1000-500-60 MCG/ML IV SOLN
INTRAVENOUS | Status: AC
  Administered 2018-11-09: 18:00:00 via INTRAVENOUS
  Filled 2018-11-09: qty 936

## 2018-11-09 NOTE — Progress Notes (Signed)
[facility] Progress Note    Assessment/ Plan:    1. Dialysis dependent AKI from ATN, anuric; off CRRT 11/27, back on 11/30.  Was on fixed dose heparin; now citrate. with 0K replacement fluids (rest 4K).   2. Progressive hyperkalemia, probably from frequent clotting on dialyzer- as above 3. Septic shock secondary to severe necrotizing pancreatitis; on meropenem and eraxis.  WBC rising.  Discussed with PCCM- likely re-scan abd today, last scan 11/26.l  4. Acute hypoxic RF: extubated 11/26, reintubated 11/29, s/p trach 12/1 5. Hypocalcemia; stable on 2.5Ca bath; corrects 2/2 hypoalbuminemia-- watch closely on citrate 6. Anemia 7. AMS 8. Temp HD cath present since 11/22  Subjective:    S/p trach yesterday.  Pressor requirement increasing, WBC up in the 60s-70s since 11/30.  CRRT continuing with fixed-dose heparin and 0K replacement fluid.     Objective:   BP (!) 104/49 (BP Location: Right Arm)   Pulse 86   Temp (!) 101.2 F (38.4 C) (Axillary)   Resp (!) 32   Ht 6' (1.829 m)   Wt 88.3 kg   SpO2 100%   BMI 26.40 kg/m   Intake/Output Summary (Last 24 hours) at 11/09/2018 0947 Last data filed at 11/09/2018 0900 Gross per 24 hour  Intake 4588.36 ml  Output 4261 ml  Net 327.36 ml   Weight change: -0.6 kg  Physical Exam: Gen: ill-jaundiced, not responsive to stimuli HEENT + trach without drainage CVS: RRR Resp: rhonchrous bilaterally Abd: soft, tender in epigastrum Ext:1+ diffuse anasarca  Imaging: Dg Abd 1 View  Result Date: 11/08/2018 CLINICAL DATA:  Ileus/constipation. EXAM: ABDOMEN - 1 VIEW COMPARISON:  11/08/2018. FINDINGS: Enteric tube is present with tip unchanged over the right upper quadrant likely over the distal stomach. Bowel gas pattern is nonobstructive with mild fecal retention throughout the colon. Remainder of the exam is unchanged. IMPRESSION: Nonobstructive bowel gas pattern with mild fecal retention throughout the colon. Nasogastric tube  unchanged. Electronically Signed   By: Marin Olp M.D.   On: 11/08/2018 15:20   Dg Chest Port 1 View  Result Date: 11/09/2018 CLINICAL DATA:  67 year old male with ileus.  Subsequent encounter. EXAM: PORTABLE CHEST 1 VIEW COMPARISON:  11/08/2018 chest x-ray. FINDINGS: Tracheostomy tube tip midline. Right central line tip proximal superior vena cava level. Left central line tip distal superior vena cava level. Persistent consolidation right lung base may represent infiltrate or atelectasis. Less notable findings left lung base. Pulmonary vascular congestion appears stable. Heart size within normal limits.  Calcified aorta. Nasogastric tube courses below diaphragm. Tip not imaged on present exam. No pneumothorax detected. IMPRESSION: 1. Persistent consolidation right lung base may represent infiltrate or atelectasis. Less notable findings left lung base. 2. Pulmonary vascular congestion appears stable. 3. Aortic Atherosclerosis (ICD10-I70.0). 4. No change in position of central lines or tracheostomy. Electronically Signed   By: Genia Del M.D.   On: 11/09/2018 07:02   Dg Chest Port 1 View  Result Date: 11/08/2018 CLINICAL DATA:  Tracheostomy EXAM: PORTABLE CHEST 1 VIEW COMPARISON:  Chest radiograph from one day prior. FINDINGS: Tracheostomy tube tip overlies the tracheal air column at the thoracic inlet. Enteric tube enters stomach with the tip not seen on this image. Right internal jugular central venous catheter terminates in middle third of the SVC. Left internal jugular central venous catheter terminates in the lower third of the SVC. Stable cardiomediastinal silhouette with normal heart size. No pneumothorax. No pleural effusion. No overt pulmonary edema. Hazy bibasilar lung opacities are stable.  IMPRESSION: 1. Well-positioned tracheostomy tube and support structures. No pneumothorax. 2. Hazy bibasilar lung opacities, stable, which could represent atelectasis or pneumonia. Electronically Signed    By: Ilona Sorrel M.D.   On: 11/08/2018 12:56   Dg Abd Portable 1v  Result Date: 11/08/2018 CLINICAL DATA:  Ileus. EXAM: PORTABLE ABDOMEN - 1 VIEW COMPARISON:  11/04/2018 FINDINGS: Enteric tube has tip right of midline likely over the distal stomach. Bowel gas pattern is nonobstructive with mild to moderate fecal retention throughout the colon. Remainder of the exam is unchanged. IMPRESSION: Nonobstructive bowel gas pattern with mild to moderate fecal retention throughout the colon. Enteric tube with tip right of midline over the upper abdomen likely in the distal stomach. Electronically Signed   By: Marin Olp M.D.   On: 11/08/2018 15:19    Labs: BMET Recent Labs  Lab 11/06/18 1806 11/07/18 0408 11/07/18 1525 11/08/18 0452 11/08/18 1320 11/08/18 1641  11/09/18 0304 11/09/18 0310 11/09/18 0403 11/09/18 0507 11/09/18 0512 11/09/18 0701 11/09/18 0705  NA 139 138 138 137 134* 136   < > 136 133* 133* 133* 135 134* 136  K 5.2* 5.7* 5.4* 5.8* 5.4* 5.2*   < > 4.5 4.7 4.6 4.8 4.6 4.3 4.2  CL 101 98 102 100 100 99   < > 94* 97* 94* 96* 94* 95* 92*  CO2 24 22 21* 21* 21* 25  --   --   --  26  --   --   --   --   GLUCOSE 199* 211* 188* 117* 131* 83   < > 306* 306* 305* 322* 317* 298* 302*  BUN 105* 117* 103* 75* 66* 62*   < > 47* 57* 59* 52* 45* 50* 42*  CREATININE 7.21* 8.24* 6.75* 4.90* 4.25* 4.32*   < > 3.30* 4.20* 4.08* 4.10* 3.30* 4.00* 3.10*  CALCIUM 6.3* 6.5* 6.0* 7.0* 7.0* 7.2*  --   --   --  7.3*  --   --   --   --   PHOS 5.1* 5.0* 5.0* 5.5* 4.7* 4.8*  --   --   --  4.0  --   --   --   --    < > = values in this interval not displayed.   CBC Recent Labs  Lab 11/05/18 0443 11/07/18 0408 11/08/18 1027  11/09/18 0403 11/09/18 0507 11/09/18 0512 11/09/18 0701 11/09/18 0705  WBC 39.6* 62.0* 77.7*  --  68.9*  --   --   --   --   NEUTROABS 33.3*  --   --   --  46.8*  --   --   --   --   HGB 8.1* 8.1* 7.9*   < > 7.3* 8.5* 9.2* 7.8* 8.8*  HCT 25.4* 24.6* 24.8*   < > 22.6*  25.0* 27.0* 23.0* 26.0*  MCV 87.3 85.1 87.6  --  86.9  --   --   --   --   PLT 129* 149* 170  --  165  --   --   --   --    < > = values in this interval not displayed.    Medications:    . chlorhexidine gluconate (MEDLINE KIT)  15 mL Mouth Rinse BID  . heparin injection (subcutaneous)  5,000 Units Subcutaneous Q8H  . insulin aspart  0-15 Units Subcutaneous Q4H  . mouth rinse  15 mL Mouth Rinse 10 times per day  . pantoprazole (PROTONIX) IV  40 mg Intravenous Daily  .  sodium chloride flush  10-40 mL Intracatheter Q12H      Madelon Lips, MD 11/09/2018, 9:47 AM

## 2018-11-09 NOTE — Progress Notes (Signed)
SLP Cancellation Note  Patient Details Name: Derek Riley MRN: [id number] DOB: 1951-05-29   Cancelled treatment:       Reason Eval/Treat Not Completed: Other (comment) Orders received for PMV/swallow evaluations. Discussed with RN: pt starting to wean, RR increasing easily with activity, still requiring some sedation. Will f/u for readiness to start SLP services, hopefully over the next couple days.   Germain Osgood 11/09/2018, 12:32 PM  Germain Osgood, M.A. [place] [facility] Environmental education officer [phone] Office [phone]

## 2018-11-09 NOTE — Progress Notes (Signed)
NAME:  Derek Riley, MRN:  [id number], DOB:  01-Jun-1951, LOS: 12 ADMISSION DATE:  10/13/2018, CONSULTATION DATE:  11/22 REFERRING MD:  11/22, CHIEF COMPLAINT:  Worsening metabolic acidosis and renal failure w/ clinical decline  Brief History   67 year old male admitted 11/20 w/ gallstone pancreatitis. PCCM consulted 11/22 for progressive metabolic acidosis, acute renal failure and persistent clinical decline.   Past Medical History  Diabetes, gallstones, hypertension.  Significant Hospital Events   11/20 transferred to [facility] from [place] w/ acute gallstone pancreatitis. GI & surgicals services consulted. Started On supportive IVFs, empiric zosyn and analgesic support.  Surgical service is planning on cholecystectomy once pancreatitis improved.  Initial lipase on arrival to [place], creatinine 1.72 11/21: MRCP 11/21 showing extensive pancreatitis, possible hemorrhagic peripancreatic fluid no choledocholithiasis, lipase 3361, creatinine up to 4.91, anion gap 13.  No role for ERCP continued supportive care 11/22: Creatinine up to 6.95, gap now 16, progressive diffuse mottling, worse pain, critical care consulted for shock, and progressive multiorgan failure, moving to intensive care for CRRT as well as hemodynamic support. 3.7 liters + since admit, developed worsening resp failure requiring intubation shortly after arrival. Possible aspiration during intubation  11/26 started on meropenem (d/t rising CBC) CRRT stopped 11/27 extubated, pressors off.  11/28 look weaker. WIll probably need re-intubation. 11/29 reintubated for increasing WOB, likely aspiration even overnight.  11/30: Remains intubated.  White blood cell count now up to 62,000.  CRRT resumed 12/1: No significant change.  Ongoing cuff leak opting for early tracheostomy versus endotracheal tube change.  12/2: tolerating PS wean at 10/5, CT of the abdomen/pelvis ordered  Consults:  11/20 GI and surgical services 11/22  critical care  Procedures:  11/22 Foley catheter 11/22 right IJ CVL (HD)>>> 11/22 OETT 11/22>>>12/1 Trach 12/1 (JY)>>>  Significant Diagnostic Tests:  MRCP 11/21: Acute pancreatitis with extensive pancreatic and periPancreatic inflammatory changes, including proteinaceous and/or hemorrhagic peri-pancreatic fluid.  No choledocholithiasis or biliary tract obstruction.  Small volume of ascites, trace bilateral pleural effusions.  Bilateral basilar volume loss  Micro Data:  Cultures negative  Antimicrobials:  Zosyn 11/20>>>11/26 Meropenem 11/26>>>  Interim history/subjective:  Tolerating some PS trials today  Objective   Blood pressure (!) 88/40, pulse 76, temperature (!) 101.2 F (38.4 C), temperature source Axillary, resp. rate (!) 22, height 6' (1.829 m), weight 88.3 kg, SpO2 100 %.    Vent Mode: PRVC FiO2 (%):  [40 %-100 %] 40 % Set Rate:  [18 bmp-24 bmp] 18 bmp Vt Set:  [620 mL] 620 mL PEEP:  [10 cmH20] 10 cmH20 Plateau Pressure:  [19 cmH20-27 cmH20] 22 cmH20   Intake/Output Summary (Last 24 hours) at 11/09/2018 1042 Last data filed at 11/09/2018 1000 Gross per 24 hour  Intake 4719.58 ml  Output 4428 ml  Net 291.58 ml   Filed Weights   11/07/18 0500 11/08/18 0400 11/09/18 0500  Weight: 88.1 kg 88.9 kg 88.3 kg   Examination: General: Acutely ill appearing male, NAD HEENT: [place]/AT, PERRL, EOM-I and MMM, trach 6 cuffed shiley in place Pulmonary: Coarse BS diffusely Cardiac: RRR, Nl S1/S2 and -M/R/G Abdomen: Soft but tender to palpation diffusely GU: Minimal urine output Neuro: Agitated at time but calm during exam  Resolved Hospital Problem list     Assessment & Plan:   Acute gallstone pancreatitis development of creatinine necrosis -No evidence of choledocholelithiasis on MRCP Plan NPO TPN Day #6 meropenem empirically started for necrotic pancreas and concern for secondary infection also cross covering for  aspiration event  PRN analgesia  Eventually needs  cholecystectomy but after acute phase  Ileus Plan CT of the abdomen today for ?ileus given extremely high WBC Fleets enema  Ongoing leukocytosis in setting of pancreatic inflammation Plan Repeat CBC this a.m. C. difficile negative CT as above  Acute hypoxic respiratory failure requiring mechanical ventilation in the setting of acute aspiration event 11/29, progressive atelectasis, and decreased abdominal compliance from pain and impaired cough Portable chest x-ray from 1/30: This demonstrated worsening right basilar airspace disease most likely representing atelectasis plus minus aspiration event He has an ongoing cuff leak, spoke with family they are agreeable to tracheostomy today Plan Continuing PAD protocol Change RASS to 0 to -1 following trach Begin PS trials but full vent support once going to the scanner  Daily assessment for spontaneous breathing trial following trach Wean oxygen VAP bundle CXR to PRN at this point  Severe deconditioning and protein calorie malnutrition Plan Continue TPN Will need LTAC pending hemodynamics  Acute kidney injury with baseline creatinine 1.  This was hemodynamically mediated plus/minus contrast-induced nephropathy Plan Nephrology following Continuing CRRT Follow-up chemistries  Ongoing hypotension: Appears to be drug related from narcotics Plan Norepi and vaso for MAP of 65 mmHg  Hyperglycemia Plan Sliding scale insulin  Intermittent fluid and electrolyte imbalance: Hyperkalemia Plan Monitor and replace as indicated Dialysate adjusted for rising potassium  Best practice:  Diet: NPO - TPN Pain/Anxiety/Delirium protocol (if indicated): fent /precedex  VAP protocol (if indicated):  bundle DVT prophylaxis: change from LMWH to [facility] heparin 11/22 GI prophylaxis: PPI (active reflux) Glucose control: ssi and insulin tpn. Mobility: bed rest Code Status: full code Family Communication: Brother updated via phone Disposition: ICU  mains critically ill due to ongoing need for mechanical ventilation, titration of sedation drips, titration of hemodynamic drips, close observation of metabolic derangements.  The patient is critically ill with multiple organ systems failure and requires high complexity decision making for assessment and support, frequent evaluation and titration of therapies, application of advanced monitoring technologies and extensive interpretation of multiple databases.   Critical Care Time devoted to patient care services described in this note is  36  Minutes. This time reflects time of care of this signee Dr Jennet Maduro. This critical care time does not reflect procedure time, or teaching time or supervisory time of PA/NP/Med student/Med Resident etc but could involve care discussion time.  Rush Farmer, M.D. [facility] Pulmonary/Critical Care Medicine. Pager: [phone]. After hours pager: [phone].

## 2018-11-09 NOTE — Progress Notes (Addendum)
PHARMACY - ADULT TOTAL PARENTERAL NUTRITION CONSULT NOTE   Pharmacy Consult:  TPN Indication: Necrotizing pancreatitis  Patient Measurements: Height: 6' (182.9 cm) Weight: 194 lb 10.7 oz (88.3 kg) IBW/kg (Calculated) : 77.6 TPN AdjBW (KG): 87.4 Body mass index is 26.4 kg/m. Usual Weight: 88 kg  Assessment:  66 YOM with PMH of DM, gallstones, and HTN. Originally admitted to [facility] on 11/20 after transfer d/t acute gallstone pancreatitis. ERCP on 10/29/18 showed extensive pancreatitis and possible hemorrhagic peripancreatic fluid. Patient was moved to ICU on 11/22 and received CRRT unitl 11/27. CT abdomen 11/26 showed necrotizing pancreatitis.  Patient decompensated 11/06/18 requiring reintubation and restart of pressors; CRRT restarted 11/07/18.  GI: pending cholecystectomy when pancreatitis has resolved.  Baseline prealbumin < 5 remains <5, NG O/P 454m ,BM 12/2,  PPI IV, PRN Zofran  Endo: DM on metformin PTA.  CBGs uncontrolled 107-284 Insulin requirements in the past 24 hours: 24 units SSI + 35 units in TPN - Several glucose-containing infusions. (Ca gluconate, Levophed, NaCitrate)  Lytes: Magnesium and Cl low, CoCa 9.54 with low albumin - Calcium Gluconate infusion with CRRT  Renal: severe AKI from ATN, s/p 5d CRRT >> iHD but didn't tolerate on 11/29 d/t hypotension, CRRT restarted 11/30.  Net +7.5L. No UOP. Severe metabolic acidosis  Pulm: intubated 11/22>>11/26, reintubated 11/29. Trach placed 12/1.  Cards: Vasopressin, Norepi inf  Hepatobil: AST/alk phos trending up, ALT WNL, tbili elevated 4 (jaundice per RN), TG were 219>now 138   Neuro:Sedated on the vent - Precedex, Fentanyl  ID: Merrem/Eraxis for necrotizing pancreatitis, cover for aspiration, s/p Zosyn - Tmax 101.2, WBC up to 68.9, LA 1.6, PCT 6.25  TPN Access: HD cath pigtail + CVC triple lumen placed 12/06/18 TPN start date: 11/05/18  Nutritional Goals (per RD rec on 11/29): ICU needs: 2189 kCal, 130-145gm  protein per day Non-ICU needs: 2300 - 2600, > 135gm protein per day  Current Nutrition:  TPN  Plan:  Concentrated TPN at goal rate of 75 ml/hr. - TPN will provide 140g AA (1.75g/kg/d Adj BW), 315g CHO, and 55g ILE for a total of 2182 kCal per day (27 kcal/kg/day adj BW), meeting ~100% of patient's needs  - Electrolytes in TPN: none except Na and Ca, Incr Cl  - Full dose multivitamin and half-dose trace elements in TPN d/t jaundice   - Reduce SSI to moderate Q4H and increase to 51 units regular insulin in TPN (increase CHO in TPN)   Lamya Lausch S. RAlford Highland PharmD, BCPS Clinical Staff Pharmacist [email or web address] 11/09/2018, 9:47 AM

## 2018-11-09 NOTE — Progress Notes (Signed)
Transported pt to CT with RN 

## 2018-11-10 DIAGNOSIS — R6521 Severe sepsis with septic shock: Secondary | ICD-10-CM

## 2018-11-10 DIAGNOSIS — A419 Sepsis, unspecified organism: Secondary | ICD-10-CM

## 2018-11-10 LAB — POCT I-STAT, CHEM 8
BUN: 34 mg/dL — ABNORMAL HIGH (ref 8–23)
BUN: 36 mg/dL — ABNORMAL HIGH (ref 8–23)
BUN: 36 mg/dL — ABNORMAL HIGH (ref 8–23)
BUN: 41 mg/dL — ABNORMAL HIGH (ref 8–23)
BUN: 35 mg/dL — AB (ref 8–23)
BUN: 40 mg/dL — ABNORMAL HIGH (ref 8–23)
BUN: 39 mg/dL — ABNORMAL HIGH (ref 8–23)
BUN: 34 mg/dL — ABNORMAL HIGH (ref 8–23)
BUN: 33 mg/dL — ABNORMAL HIGH (ref 8–23)
BUN: 37 mg/dL — ABNORMAL HIGH (ref 8–23)
BUN: 32 mg/dL — AB (ref 8–23)
BUN: 33 mg/dL — ABNORMAL HIGH (ref 8–23)
BUN: 38 mg/dL — ABNORMAL HIGH (ref 8–23)
BUN: 38 mg/dL — ABNORMAL HIGH (ref 8–23)
BUN: 33 mg/dL — ABNORMAL HIGH (ref 8–23)
BUN: 36 mg/dL — ABNORMAL HIGH (ref 8–23)
BUN: 31 mg/dL — ABNORMAL HIGH (ref 8–23)
BUN: 37 mg/dL — ABNORMAL HIGH (ref 8–23)
BUN: 30 mg/dL — ABNORMAL HIGH (ref 8–23)
BUN: 39 mg/dL — ABNORMAL HIGH (ref 8–23)
BUN: 29 mg/dL — ABNORMAL HIGH (ref 8–23)
BUN: 37 mg/dL — ABNORMAL HIGH (ref 8–23)
CALCIUM ION: 0.42 mmol/L — AB (ref 1.15–1.40)
CHLORIDE: 87 mmol/L — AB (ref 98–111)
CREATININE: 2.8 mg/dL — AB (ref 0.61–1.24)
Calcium, Ion: 0.43 mmol/L — CL (ref 1.15–1.40)
Calcium, Ion: 1.03 mmol/L — ABNORMAL LOW (ref 1.15–1.40)
Calcium, Ion: 1.04 mmol/L — ABNORMAL LOW (ref 1.15–1.40)
Calcium, Ion: 1.06 mmol/L — ABNORMAL LOW (ref 1.15–1.40)
Calcium, Ion: 1.13 mmol/L — ABNORMAL LOW (ref 1.15–1.40)
Calcium, Ion: 0.51 mmol/L — CL (ref 1.15–1.40)
Calcium, Ion: 0.47 mmol/L — CL (ref 1.15–1.40)
Calcium, Ion: 1.14 mmol/L — ABNORMAL LOW (ref 1.15–1.40)
Calcium, Ion: 0.43 mmol/L — CL (ref 1.15–1.40)
Calcium, Ion: 0.43 mmol/L — CL (ref 1.15–1.40)
Calcium, Ion: 0.42 mmol/L — CL (ref 1.15–1.40)
Calcium, Ion: 1.13 mmol/L — ABNORMAL LOW (ref 1.15–1.40)
Calcium, Ion: 1.13 mmol/L — ABNORMAL LOW (ref 1.15–1.40)
Calcium, Ion: 0.41 mmol/L — CL (ref 1.15–1.40)
Calcium, Ion: 1.11 mmol/L — ABNORMAL LOW (ref 1.15–1.40)
Calcium, Ion: 0.41 mmol/L — CL (ref 1.15–1.40)
Calcium, Ion: 1.12 mmol/L — ABNORMAL LOW (ref 1.15–1.40)
Calcium, Ion: 0.32 mmol/L — CL (ref 1.15–1.40)
Calcium, Ion: 1.12 mmol/L — ABNORMAL LOW (ref 1.15–1.40)
Calcium, Ion: 0.33 mmol/L — CL (ref 1.15–1.40)
Calcium, Ion: 1.15 mmol/L (ref 1.15–1.40)
Chloride: 89 mmol/L — ABNORMAL LOW (ref 98–111)
Chloride: 90 mmol/L — ABNORMAL LOW (ref 98–111)
Chloride: 91 mmol/L — ABNORMAL LOW (ref 98–111)
Chloride: 94 mmol/L — ABNORMAL LOW (ref 98–111)
Chloride: 88 mmol/L — ABNORMAL LOW (ref 98–111)
Chloride: 87 mmol/L — ABNORMAL LOW (ref 98–111)
Chloride: 85 mmol/L — ABNORMAL LOW (ref 98–111)
Chloride: 85 mmol/L — ABNORMAL LOW (ref 98–111)
Chloride: 86 mmol/L — ABNORMAL LOW (ref 98–111)
Chloride: 85 mmol/L — ABNORMAL LOW (ref 98–111)
Chloride: 87 mmol/L — ABNORMAL LOW (ref 98–111)
Chloride: 85 mmol/L — ABNORMAL LOW (ref 98–111)
Chloride: 86 mmol/L — ABNORMAL LOW (ref 98–111)
Chloride: 87 mmol/L — ABNORMAL LOW (ref 98–111)
Chloride: 84 mmol/L — ABNORMAL LOW (ref 98–111)
Chloride: 86 mmol/L — ABNORMAL LOW (ref 98–111)
Chloride: 87 mmol/L — ABNORMAL LOW (ref 98–111)
Chloride: 86 mmol/L — ABNORMAL LOW (ref 98–111)
Chloride: 87 mmol/L — ABNORMAL LOW (ref 98–111)
Chloride: 84 mmol/L — ABNORMAL LOW (ref 98–111)
Chloride: 86 mmol/L — ABNORMAL LOW (ref 98–111)
Creatinine, Ser: 2.6 mg/dL — ABNORMAL HIGH (ref 0.61–1.24)
Creatinine, Ser: 2.6 mg/dL — ABNORMAL HIGH (ref 0.61–1.24)
Creatinine, Ser: 3 mg/dL — ABNORMAL HIGH (ref 0.61–1.24)
Creatinine, Ser: 3.2 mg/dL — ABNORMAL HIGH (ref 0.61–1.24)
Creatinine, Ser: 3.1 mg/dL — ABNORMAL HIGH (ref 0.61–1.24)
Creatinine, Ser: 2.9 mg/dL — ABNORMAL HIGH (ref 0.61–1.24)
Creatinine, Ser: 2.3 mg/dL — ABNORMAL HIGH (ref 0.61–1.24)
Creatinine, Ser: 2.2 mg/dL — ABNORMAL HIGH (ref 0.61–1.24)
Creatinine, Ser: 2.7 mg/dL — ABNORMAL HIGH (ref 0.61–1.24)
Creatinine, Ser: 2.3 mg/dL — ABNORMAL HIGH (ref 0.61–1.24)
Creatinine, Ser: 2.4 mg/dL — ABNORMAL HIGH (ref 0.61–1.24)
Creatinine, Ser: 2.2 mg/dL — ABNORMAL HIGH (ref 0.61–1.24)
Creatinine, Ser: 2.8 mg/dL — ABNORMAL HIGH (ref 0.61–1.24)
Creatinine, Ser: 2.1 mg/dL — ABNORMAL HIGH (ref 0.61–1.24)
Creatinine, Ser: 2.6 mg/dL — ABNORMAL HIGH (ref 0.61–1.24)
Creatinine, Ser: 2 mg/dL — ABNORMAL HIGH (ref 0.61–1.24)
Creatinine, Ser: 2.6 mg/dL — ABNORMAL HIGH (ref 0.61–1.24)
Creatinine, Ser: 1.9 mg/dL — ABNORMAL HIGH (ref 0.61–1.24)
Creatinine, Ser: 2.4 mg/dL — ABNORMAL HIGH (ref 0.61–1.24)
Creatinine, Ser: 1.9 mg/dL — ABNORMAL HIGH (ref 0.61–1.24)
Creatinine, Ser: 2.5 mg/dL — ABNORMAL HIGH (ref 0.61–1.24)
GLUCOSE: 290 mg/dL — AB (ref 70–99)
Glucose, Bld: 260 mg/dL — ABNORMAL HIGH (ref 70–99)
Glucose, Bld: 271 mg/dL — ABNORMAL HIGH (ref 70–99)
Glucose, Bld: 291 mg/dL — ABNORMAL HIGH (ref 70–99)
Glucose, Bld: 320 mg/dL — ABNORMAL HIGH (ref 70–99)
Glucose, Bld: 328 mg/dL — ABNORMAL HIGH (ref 70–99)
Glucose, Bld: 339 mg/dL — ABNORMAL HIGH (ref 70–99)
Glucose, Bld: 350 mg/dL — ABNORMAL HIGH (ref 70–99)
Glucose, Bld: 360 mg/dL — ABNORMAL HIGH (ref 70–99)
Glucose, Bld: 330 mg/dL — ABNORMAL HIGH (ref 70–99)
Glucose, Bld: 361 mg/dL — ABNORMAL HIGH (ref 70–99)
Glucose, Bld: 335 mg/dL — ABNORMAL HIGH (ref 70–99)
Glucose, Bld: 355 mg/dL — ABNORMAL HIGH (ref 70–99)
Glucose, Bld: 358 mg/dL — ABNORMAL HIGH (ref 70–99)
Glucose, Bld: 303 mg/dL — ABNORMAL HIGH (ref 70–99)
Glucose, Bld: 332 mg/dL — ABNORMAL HIGH (ref 70–99)
Glucose, Bld: 265 mg/dL — ABNORMAL HIGH (ref 70–99)
Glucose, Bld: 299 mg/dL — ABNORMAL HIGH (ref 70–99)
Glucose, Bld: 228 mg/dL — ABNORMAL HIGH (ref 70–99)
Glucose, Bld: 291 mg/dL — ABNORMAL HIGH (ref 70–99)
Glucose, Bld: 228 mg/dL — ABNORMAL HIGH (ref 70–99)
Glucose, Bld: 286 mg/dL — ABNORMAL HIGH (ref 70–99)
HCT: 21 % — ABNORMAL LOW (ref 39.0–52.0)
HCT: 25 % — ABNORMAL LOW (ref 39.0–52.0)
HCT: 26 % — ABNORMAL LOW (ref 39.0–52.0)
HCT: 27 % — ABNORMAL LOW (ref 39.0–52.0)
HCT: 23 % — ABNORMAL LOW (ref 39.0–52.0)
HCT: 27 % — ABNORMAL LOW (ref 39.0–52.0)
HCT: 25 % — ABNORMAL LOW (ref 39.0–52.0)
HCT: 26 % — ABNORMAL LOW (ref 39.0–52.0)
HCT: 27 % — ABNORMAL LOW (ref 39.0–52.0)
HCT: 30 % — ABNORMAL LOW (ref 39.0–52.0)
HCT: 27 % — ABNORMAL LOW (ref 39.0–52.0)
HCT: 27 % — ABNORMAL LOW (ref 39.0–52.0)
HCT: 30 % — ABNORMAL LOW (ref 39.0–52.0)
HCT: 30 % — ABNORMAL LOW (ref 39.0–52.0)
HCT: 28 % — ABNORMAL LOW (ref 39.0–52.0)
HCT: 29 % — ABNORMAL LOW (ref 39.0–52.0)
HCT: 27 % — ABNORMAL LOW (ref 39.0–52.0)
HCT: 29 % — ABNORMAL LOW (ref 39.0–52.0)
HEMATOCRIT: 23 % — AB (ref 39.0–52.0)
HEMATOCRIT: 27 % — AB (ref 39.0–52.0)
HEMATOCRIT: 27 % — AB (ref 39.0–52.0)
HEMATOCRIT: 30 % — AB (ref 39.0–52.0)
HEMOGLOBIN: 10.2 g/dL — AB (ref 13.0–17.0)
HEMOGLOBIN: 8.5 g/dL — AB (ref 13.0–17.0)
Hemoglobin: 7.1 g/dL — ABNORMAL LOW (ref 13.0–17.0)
Hemoglobin: 8.8 g/dL — ABNORMAL LOW (ref 13.0–17.0)
Hemoglobin: 9.2 g/dL — ABNORMAL LOW (ref 13.0–17.0)
Hemoglobin: 7.8 g/dL — ABNORMAL LOW (ref 13.0–17.0)
Hemoglobin: 7.8 g/dL — ABNORMAL LOW (ref 13.0–17.0)
Hemoglobin: 9.2 g/dL — ABNORMAL LOW (ref 13.0–17.0)
Hemoglobin: 8.5 g/dL — ABNORMAL LOW (ref 13.0–17.0)
Hemoglobin: 8.8 g/dL — ABNORMAL LOW (ref 13.0–17.0)
Hemoglobin: 10.2 g/dL — ABNORMAL LOW (ref 13.0–17.0)
Hemoglobin: 9.2 g/dL — ABNORMAL LOW (ref 13.0–17.0)
Hemoglobin: 10.2 g/dL — ABNORMAL LOW (ref 13.0–17.0)
Hemoglobin: 9.2 g/dL — ABNORMAL LOW (ref 13.0–17.0)
Hemoglobin: 9.2 g/dL — ABNORMAL LOW (ref 13.0–17.0)
Hemoglobin: 9.2 g/dL — ABNORMAL LOW (ref 13.0–17.0)
Hemoglobin: 10.2 g/dL — ABNORMAL LOW (ref 13.0–17.0)
Hemoglobin: 9.2 g/dL — ABNORMAL LOW (ref 13.0–17.0)
Hemoglobin: 9.5 g/dL — ABNORMAL LOW (ref 13.0–17.0)
Hemoglobin: 9.9 g/dL — ABNORMAL LOW (ref 13.0–17.0)
Hemoglobin: 9.2 g/dL — ABNORMAL LOW (ref 13.0–17.0)
Hemoglobin: 9.9 g/dL — ABNORMAL LOW (ref 13.0–17.0)
POTASSIUM: 3.9 mmol/L (ref 3.5–5.1)
POTASSIUM: 3.1 mmol/L — AB (ref 3.5–5.1)
Potassium: 3.6 mmol/L (ref 3.5–5.1)
Potassium: 3.7 mmol/L (ref 3.5–5.1)
Potassium: 4 mmol/L (ref 3.5–5.1)
Potassium: 4.2 mmol/L (ref 3.5–5.1)
Potassium: 4 mmol/L (ref 3.5–5.1)
Potassium: 3.6 mmol/L (ref 3.5–5.1)
Potassium: 3.6 mmol/L (ref 3.5–5.1)
Potassium: 3.7 mmol/L (ref 3.5–5.1)
Potassium: 3.7 mmol/L (ref 3.5–5.1)
Potassium: 3.8 mmol/L (ref 3.5–5.1)
Potassium: 3.5 mmol/L (ref 3.5–5.1)
Potassium: 3.6 mmol/L (ref 3.5–5.1)
Potassium: 3.9 mmol/L (ref 3.5–5.1)
Potassium: 3.4 mmol/L — ABNORMAL LOW (ref 3.5–5.1)
Potassium: 3.4 mmol/L — ABNORMAL LOW (ref 3.5–5.1)
Potassium: 3.5 mmol/L (ref 3.5–5.1)
Potassium: 3.6 mmol/L (ref 3.5–5.1)
Potassium: 3.4 mmol/L — ABNORMAL LOW (ref 3.5–5.1)
Potassium: 3.7 mmol/L (ref 3.5–5.1)
Potassium: 3.3 mmol/L — ABNORMAL LOW (ref 3.5–5.1)
SODIUM: 132 mmol/L — AB (ref 135–145)
SODIUM: 137 mmol/L (ref 135–145)
Sodium: 133 mmol/L — ABNORMAL LOW (ref 135–145)
Sodium: 135 mmol/L (ref 135–145)
Sodium: 135 mmol/L (ref 135–145)
Sodium: 135 mmol/L (ref 135–145)
Sodium: 132 mmol/L — ABNORMAL LOW (ref 135–145)
Sodium: 132 mmol/L — ABNORMAL LOW (ref 135–145)
Sodium: 134 mmol/L — ABNORMAL LOW (ref 135–145)
Sodium: 135 mmol/L (ref 135–145)
Sodium: 135 mmol/L (ref 135–145)
Sodium: 135 mmol/L (ref 135–145)
Sodium: 132 mmol/L — ABNORMAL LOW (ref 135–145)
Sodium: 133 mmol/L — ABNORMAL LOW (ref 135–145)
Sodium: 136 mmol/L (ref 135–145)
Sodium: 133 mmol/L — ABNORMAL LOW (ref 135–145)
Sodium: 136 mmol/L (ref 135–145)
Sodium: 134 mmol/L — ABNORMAL LOW (ref 135–145)
Sodium: 134 mmol/L — ABNORMAL LOW (ref 135–145)
Sodium: 137 mmol/L (ref 135–145)
Sodium: 134 mmol/L — ABNORMAL LOW (ref 135–145)
Sodium: 137 mmol/L (ref 135–145)
TCO2: 25 mmol/L (ref 22–32)
TCO2: 26 mmol/L (ref 22–32)
TCO2: 27 mmol/L (ref 22–32)
TCO2: 28 mmol/L (ref 22–32)
TCO2: 29 mmol/L (ref 22–32)
TCO2: 30 mmol/L (ref 22–32)
TCO2: 31 mmol/L (ref 22–32)
TCO2: 32 mmol/L (ref 22–32)
TCO2: 32 mmol/L (ref 22–32)
TCO2: 29 mmol/L (ref 22–32)
TCO2: 33 mmol/L — ABNORMAL HIGH (ref 22–32)
TCO2: 32 mmol/L (ref 22–32)
TCO2: 32 mmol/L (ref 22–32)
TCO2: 35 mmol/L — ABNORMAL HIGH (ref 22–32)
TCO2: 33 mmol/L — ABNORMAL HIGH (ref 22–32)
TCO2: 34 mmol/L — ABNORMAL HIGH (ref 22–32)
TCO2: 33 mmol/L — ABNORMAL HIGH (ref 22–32)
TCO2: 35 mmol/L — ABNORMAL HIGH (ref 22–32)
TCO2: 32 mmol/L (ref 22–32)
TCO2: 37 mmol/L — ABNORMAL HIGH (ref 22–32)
TCO2: 33 mmol/L — ABNORMAL HIGH (ref 22–32)
TCO2: 37 mmol/L — ABNORMAL HIGH (ref 22–32)

## 2018-11-10 LAB — RENAL FUNCTION PANEL
ANION GAP: 17 — AB (ref 5–15)
ANION GAP: 16 — AB (ref 5–15)
Albumin: 1.2 g/dL — ABNORMAL LOW (ref 3.5–5.0)
Albumin: 1.3 g/dL — ABNORMAL LOW (ref 3.5–5.0)
BUN: 44 mg/dL — ABNORMAL HIGH (ref 8–23)
BUN: 38 mg/dL — ABNORMAL HIGH (ref 8–23)
CO2: 27 mmol/L (ref 22–32)
CO2: 29 mmol/L (ref 22–32)
Calcium: 8.8 mg/dL — ABNORMAL LOW (ref 8.9–10.3)
Calcium: 9.5 mg/dL (ref 8.9–10.3)
Chloride: 88 mmol/L — ABNORMAL LOW (ref 98–111)
Chloride: 88 mmol/L — ABNORMAL LOW (ref 98–111)
Creatinine, Ser: 3.08 mg/dL — ABNORMAL HIGH (ref 0.61–1.24)
Creatinine, Ser: 2.79 mg/dL — ABNORMAL HIGH (ref 0.61–1.24)
GFR calc Af Amer: 26 mL/min — ABNORMAL LOW (ref >60)
GFR calc non Af Amer: 20 mL/min — ABNORMAL LOW (ref >60)
GFR calc non Af Amer: 22 mL/min — ABNORMAL LOW (ref >60)
GFR, EST AFRICAN AMERICAN: 23 mL/min — AB (ref >60)
Glucose, Bld: 317 mg/dL — ABNORMAL HIGH (ref 70–99)
Glucose, Bld: 311 mg/dL — ABNORMAL HIGH (ref 70–99)
Phosphorus: 3.3 mg/dL (ref 2.5–4.6)
Phosphorus: 2.5 mg/dL (ref 2.5–4.6)
Potassium: 3.8 mmol/L (ref 3.5–5.1)
Potassium: 3.5 mmol/L (ref 3.5–5.1)
Sodium: 132 mmol/L — ABNORMAL LOW (ref 135–145)
Sodium: 133 mmol/L — ABNORMAL LOW (ref 135–145)

## 2018-11-10 LAB — CBC
HCT: 20.8 % — ABNORMAL LOW (ref 39.0–52.0)
Hemoglobin: 6.7 g/dL — CL (ref 13.0–17.0)
MCH: 27.7 pg (ref 26.0–34.0)
MCHC: 32.2 g/dL (ref 30.0–36.0)
MCV: 86 fL (ref 80.0–100.0)
Platelets: 183 10*3/uL (ref 150–400)
RBC: 2.42 MIL/uL — ABNORMAL LOW (ref 4.22–5.81)
RDW: 16.2 % — ABNORMAL HIGH (ref 11.5–15.5)
WBC: 78.1 10*3/uL (ref 4.0–10.5)
nRBC: 0.6 % — ABNORMAL HIGH (ref 0.0–0.2)

## 2018-11-10 LAB — CORTISOL: Cortisol, Plasma: 27.9 ug/dL

## 2018-11-10 LAB — GLUCOSE, CAPILLARY
GLUCOSE-CAPILLARY: 300 mg/dL — AB (ref 70–99)
GLUCOSE-CAPILLARY: 283 mg/dL — AB (ref 70–99)
GLUCOSE-CAPILLARY: 282 mg/dL — AB (ref 70–99)
Glucose-Capillary: 245 mg/dL — ABNORMAL HIGH (ref 70–99)
Glucose-Capillary: 262 mg/dL — ABNORMAL HIGH (ref 70–99)
Glucose-Capillary: 308 mg/dL — ABNORMAL HIGH (ref 70–99)
Glucose-Capillary: 315 mg/dL — ABNORMAL HIGH (ref 70–99)
Glucose-Capillary: 336 mg/dL — ABNORMAL HIGH (ref 70–99)
Glucose-Capillary: 344 mg/dL — ABNORMAL HIGH (ref 70–99)
Glucose-Capillary: 352 mg/dL — ABNORMAL HIGH (ref 70–99)

## 2018-11-10 LAB — ABO/RH: ABO/RH(D): B POS

## 2018-11-10 LAB — HEMOGLOBIN AND HEMATOCRIT, BLOOD
HCT: 23.7 % — ABNORMAL LOW (ref 39.0–52.0)
Hemoglobin: 8 g/dL — ABNORMAL LOW (ref 13.0–17.0)

## 2018-11-10 LAB — PREPARE RBC (CROSSMATCH)

## 2018-11-10 LAB — CALCIUM, IONIZED: CALCIUM, IONIZED, SERUM: 3.5 mg/dL — AB (ref 4.5–5.6)

## 2018-11-10 LAB — MAGNESIUM: Magnesium: 2.2 mg/dL (ref 1.7–2.4)

## 2018-11-10 LAB — PHOSPHORUS: Phosphorus: 3.4 mg/dL (ref 2.5–4.6)

## 2018-11-10 MED ORDER — NOREPINEPHRINE BITARTRATE 1 MG/ML IV SOLN
0.0000 ug/min | INTRAVENOUS | Status: DC
  Administered 2018-11-10: 36 ug/min via INTRAVENOUS
  Administered 2018-11-11: 11 ug/min via INTRAVENOUS
  Administered 2018-11-15: 2 ug/min via INTRAVENOUS
  Filled 2018-11-10 (×5): qty 16

## 2018-11-10 MED ORDER — HYDROCORTISONE NA SUCCINATE PF 100 MG IJ SOLR
50.0000 mg | Freq: Four times a day (QID) | INTRAMUSCULAR | Status: DC
  Administered 2018-11-10 – 2018-11-12 (×8): 50 mg via INTRAVENOUS
  Filled 2018-11-10 (×8): qty 2

## 2018-11-10 MED ORDER — INSULIN REGULAR(HUMAN) IN NACL 100-0.9 UT/100ML-% IV SOLN
INTRAVENOUS | Status: DC
  Administered 2018-11-10: 2.8 [IU]/h via INTRAVENOUS
  Administered 2018-11-10: 13 [IU]/h via INTRAVENOUS
  Administered 2018-11-11: 13.7 [IU]/h via INTRAVENOUS
  Administered 2018-11-11: 11 [IU]/h via INTRAVENOUS
  Filled 2018-11-10 (×6): qty 100

## 2018-11-10 MED ORDER — TRACE MINERALS CR-CU-MN-SE-ZN 10-1000-500-60 MCG/ML IV SOLN
INTRAVENOUS | Status: AC
  Administered 2018-11-10: 17:00:00 via INTRAVENOUS
  Filled 2018-11-10: qty 936

## 2018-11-10 MED ORDER — HEPARIN SODIUM (PORCINE) 1000 UNIT/ML DIALYSIS
1000.0000 [IU] | INTRAMUSCULAR | Status: DC | PRN
  Filled 2018-11-10: qty 6

## 2018-11-10 MED ORDER — INSULIN ASPART 100 UNIT/ML ~~LOC~~ SOLN: 0.0000 [IU] | SUBCUTANEOUS | Status: DC

## 2018-11-10 MED ORDER — SODIUM CHLORIDE 0.9% IV SOLUTION
Freq: Once | INTRAVENOUS | Status: AC
  Administered 2018-11-10: 10:00:00 via INTRAVENOUS

## 2018-11-10 MED ORDER — INSULIN ASPART 100 UNIT/ML ~~LOC~~ SOLN: 3.0000 [IU] | SUBCUTANEOUS | Status: DC

## 2018-11-10 MED ORDER — PRISMASOL BGK 4/2.5 32-4-2.5 MEQ/L REPLACEMENT SOLN
Status: DC
  Administered 2018-11-10 – 2018-11-18 (×10): via INTRAVENOUS_CENTRAL
  Filled 2018-11-10 (×14): qty 5000

## 2018-11-10 NOTE — Progress Notes (Signed)
NAME:  Derek Riley, MRN:  [id number], DOB:  08-21-1951, LOS: 54 ADMISSION DATE:  10/19/2018, CONSULTATION DATE:  11/22 REFERRING MD:  11/22, CHIEF COMPLAINT:  Worsening metabolic acidosis and renal failure w/ clinical decline  Brief History   68 year old male admitted 11/20 w/ gallstone pancreatitis. PCCM consulted 11/22 for progressive metabolic acidosis, acute renal failure and persistent clinical decline.   Past Medical History  Diabetes, gallstones, hypertension.  Significant Hospital Events   11/20 transferred to [facility] from [place] w/ acute gallstone pancreatitis. GI & surgicals services consulted. Started On supportive IVFs, empiric zosyn and analgesic support.  Surgical service is planning on cholecystectomy once pancreatitis improved.  Initial lipase on arrival to [place], creatinine 1.72 11/21: MRCP 11/21 showing extensive pancreatitis, possible hemorrhagic peripancreatic fluid no choledocholithiasis, lipase 3361, creatinine up to 4.91, anion gap 13.  No role for ERCP continued supportive care 11/22: Creatinine up to 6.95, gap now 16, progressive diffuse mottling, worse pain, critical care consulted for shock, and progressive multiorgan failure, moving to intensive care for CRRT as well as hemodynamic support. 3.7 liters + since admit, developed worsening resp failure requiring intubation shortly after arrival. Possible aspiration during intubation  11/26 started on meropenem (d/t rising CBC) CRRT stopped 11/27 extubated, pressors off.  11/28 look weaker. WIll probably need re-intubation. 11/29 reintubated for increasing WOB, likely aspiration even overnight.  11/30: Remains intubated.  White blood cell count now up to 62,000.  CRRT resumed 12/1: No significant change.  Ongoing cuff leak opting for early tracheostomy versus endotracheal tube change.  12/2: tolerating PS wean at 10/5, CT of the abdomen/pelvis ordered  Consults:  11/20 GI and surgical services 11/22  critical care  Procedures:  11/22 Foley catheter 11/22 right IJ CVL (HD)>>> 11/22 OETT 11/22>>>12/1 Trach 12/1 (JY)>>>  Significant Diagnostic Tests:  MRCP 11/21: Acute pancreatitis with extensive pancreatic and periPancreatic inflammatory changes, including proteinaceous and/or hemorrhagic peri-pancreatic fluid.  No choledocholithiasis or biliary tract obstruction.  Small volume of ascites, trace bilateral pleural effusions.  Bilateral basilar volume loss  Micro Data:  Cultures negative  Antimicrobials:  Zosyn 11/20>>>11/26 Meropenem 11/26>>>  Interim history/subjective:  Hypotensive overnight requiring multiple pressors CT of the abdomen and pelvis done overnight with no fluid collection  Objective   Blood pressure 140/65, pulse 73, temperature 98 F (36.7 C), temperature source Oral, resp. rate 20, height 6' (1.829 m), weight 90.5 kg, SpO2 100 %. CVP:  [14 mmHg] 14 mmHg  Vent Mode: PRVC FiO2 (%):  [30 %-40 %] 30 % Set Rate:  [18 bmp] 18 bmp Vt Set:  [620 mL] 620 mL PEEP:  [5 cmH20-10 cmH20] 10 cmH20 Pressure Support:  [10 cmH20] 10 cmH20 Plateau Pressure:  [23 cmH20-33 cmH20] 33 cmH20   Intake/Output Summary (Last 24 hours) at 11/10/2018 1009 Last data filed at 11/10/2018 1000 Gross per 24 hour  Intake 6361.65 ml  Output 6006 ml  Net 355.65 ml   Filed Weights   11/08/18 0400 11/09/18 0500 11/10/18 0452  Weight: 88.9 kg 88.3 kg 90.5 kg   Examination: General: Acutely ill appearing male, on vent and pressors HEENT: [place]/AT, PERRL, EOM-I and MMM with size 6 cuffed trach in place Pulmonary: Coarse BS diffusely Cardiac: RRR, Nl S1/S2 and -M/R/G Abdomen: Soft, NT, ND and +BS GU: Minimal urine output Neuro: Sedate, calm, moving all ext spontaneously Skin: mild jaundice, intact  CT of the abdomen and pelvis   Resolved Hospital Problem list     Assessment & Plan:  Acute gallstone pancreatitis development of creatinine necrosis -No evidence of  choledocholelithiasis on MRCP Plan NPO TPN Day #7 meropenem empirically started for necrotic pancreas and concern for secondary infection also cross covering for aspiration event  PRN analgesia  Eventually needs cholecystectomy but after acute phase of pancreatitis has resolved Will leave a call out to surgery but I do not believe patient has any surgical indications at this time, no distinct fluid collection  Ileus Plan CT of the abdomen today for ?ileus given extremely high WBC Fleets enema  Ongoing leukocytosis in setting of pancreatic inflammation Plan Repeat CBC this a.m. C. difficile negative CT as above  Acute hypoxic respiratory failure requiring mechanical ventilation in the setting of acute aspiration event 11/29, progressive atelectasis, and decreased abdominal compliance from pain and impaired cough Portable chest x-ray from 1/30: This demonstrated worsening right basilar airspace disease most likely representing atelectasis plus minus aspiration event He has an ongoing cuff leak, spoke with family they are agreeable to tracheostomy today Plan Continuing PAD protocol Change RASS to 0 to -1 Decrease PEEP to 5 Place on PS as able Daily assessment for spontaneous breathing trial following trach Wean oxygen VAP bundle CXR to PRN at this point  Severe deconditioning and protein calorie malnutrition Plan Continue TPN Will need LTAC pending hemodynamics and dialysis needs  Acute kidney injury with baseline creatinine 1.  This was hemodynamically mediated plus/minus contrast-induced nephropathy Plan Nephrology following Continuing CRRT - even balance at this point Follow-up chemistries  Ongoing hypotension: Appears to be drug related from narcotics Plan Norepi and vaso for MAP of 65 mmHg Cortisol level Stress dose steroids  Hyperglycemia Plan Sliding scale insulin  Intermittent fluid and electrolyte imbalance: Hyperkalemia Plan Monitor and replace as  indicated Electrolytes addressed via dialysis  The patient is critically ill with multiple organ systems failure and requires high complexity decision making for assessment and support, frequent evaluation and titration of therapies, application of advanced monitoring technologies and extensive interpretation of multiple databases.   Critical Care Time devoted to patient care services described in this note is  33  Minutes. This time reflects time of care of this signee Dr Jennet Maduro. This critical care time does not reflect procedure time, or teaching time or supervisory time of PA/NP/Med student/Med Resident etc but could involve care discussion time.  Rush Farmer, M.D. [facility] Pulmonary/Critical Care Medicine. Pager: [phone]. After hours pager: [phone].

## 2018-11-10 NOTE — Progress Notes (Signed)
PHARMACY - ADULT TOTAL PARENTERAL NUTRITION CONSULT NOTE   Pharmacy Consult:  TPN Indication: Necrotizing pancreatitis  Patient Measurements: Height: 6' (182.9 cm) Weight: 199 lb 8.3 oz (90.5 kg) IBW/kg (Calculated) : 77.6 TPN AdjBW (KG): 87.4 Body mass index is 27.06 kg/m. Usual Weight: 88 kg  Assessment:  67 YOM with PMH of DM, gallstones, and HTN. [facility]/20 after transfer d/t acute gallstone pancreatitis. ERCP on 10/29/18 showed extensive pancreatitis and possible hemorrhagic peripancreatic fluid. Patient was moved to ICU on 11/22 and received CRRT unitl 11/27. CT abdomen 11/26 showed necrotizing pancreatitis.  Patient decompensated 11/06/18 requiring reintubation and restart of pressors; CRRT restarted 11/07/18.  GI: Pending cholecystectomy when pancreatitis has resolved.  Baseline prealbumin < 5 remains <5, NG O/P 350mL (down), BM 12/2,  PPI IV, PRN Zofran  Endo: DM on metformin PTA.  CBGs uncontrolled 263 up to 352 Insulin requirements in the past 24 hours: 69 units SSI last 24 hrs + 51 units in TPN (also on steroids) - Several glucose-containing infusions. (Ca gluconate, Levophed, NaCitrate)  Lytes: Magnesium and Cl low, Ionized Ca 1.14 low. - Calcium Gluconate infusion with CRRT  Renal: severe AKI from ATN, s/p 5d CRRT >> iHD but didn't tolerate on 11/29 d/t hypotension, CRRT restarted 11/30.  Net +7.9L. No UOP. Severe metabolic acidosis  Pulm: intubated 11/22>>11/26, reintubated 11/29. Trach placed 12/1.  Cards: Vasopressin, Norepi inf  Hepatobil: AST/alk phos trending up, ALT WNL, tbili elevated 4 (jaundice per RN), TG were 219>now 138   Neuro:Sedated on the vent - Precedex, Fentanyl  ID: Merrem/Eraxis for necrotizing pancreatitis, cover for aspiration, s/p Zosyn - Tmax 101.2, WBC up to 78.1, LA 1.6, PCT 6.25  TPN Access: HD cath pigtail + CVC triple lumen placed 12/06/18 TPN start date: 11/05/18  Nutritional Goals (per RD rec on  11/29): ICU needs: 2189 kCal, 130-145gm protein per day Non-ICU needs: 2300 - 2600, > 135gm protein per day  Current Nutrition:  TPN  Plan:  Concentrated TPN at goal rate of 75 ml/hr. - TPN will provide 140g AA (1.75g/kg/d Adj BW), 315g CHO, and 55g ILE for a total of 2182 kCal per day (27 kcal/kg/day adj BW), meeting ~100% of patient's needs  - Electrolytes in TPN: none except Na and increase Ca, Max  - Full dose multivitamin and half-dose trace elements in TPN d/t jaundice   - Reduce SSI to moderate Q4H and increase to 92 units regular insulin in TPN (increase CHO in TPN). Highly recommend starting ICU Hyperglycemia protocol with insulin infusion   Merri Dimaano S. Lorali Khamis, PharmD, BCPS Clinical Staff Pharmacist [email or web address] 11/10/2018, 10:52 AM    

## 2018-11-10 NOTE — Progress Notes (Signed)
Inpatient Diabetes Program Recommendations  AACE/ADA: New Consensus Statement on Inpatient Glycemic Control (2015)  Target Ranges:  Prepandial:   less than 140 mg/dL      Peak postprandial:   less than 180 mg/dL (1-2 hours)      Critically ill patients:  140 - 180 mg/dL   Lab Results  Component Value Date   GLUCAP 352 (H) 11/10/2018   HGBA1C 8.0 (H) 10/26/2018    Review of Glycemic Control Results for Derek Riley, Derek Riley (MRN [id number]) as of 11/10/2018 10:26  Ref. Range 11/09/2018 15:35 11/09/2018 20:10 11/10/2018 00:14 11/10/2018 04:15 11/10/2018 07:53  Glucose-Capillary Latest Ref Range: 70 - 99 mg/dL 327 (H) 288 (H) 262 (H) 344 (H) 352 (H)   Diabetes history: DM 2 Outpatient Diabetes medications: Metformin 1000 mg bid Current orders for Inpatient glycemic control:  Novolog moderate q 4 hours TPN- 51 units/ liter of Regular insulin  Inpatient Diabetes Program Recommendations:  Patient has required 61 unit of correction insulin over the past 24 hours and blood sugars continue to be > goal. Consider further titration up of insulin in TPN.  -If blood sugars continue to be > goal, may consider IV insulin using Glucostabilizer to determine insulin needs and to meet glycemic control goals of 140-180 mg/dL.     Thanks,  Adah Perl, RN, BC-ADM Inpatient Diabetes Coordinator Pager [phone] (8a-5p)

## 2018-11-10 NOTE — Progress Notes (Signed)
Pharmacy Antibiotic Note  Derek Riley is a 67 y.o. male admitted on 10/24/2018 with necrotizing pancreatitis.  Pharmacy has been consulted for meropenem dosing.   On abx for acute gallstone pancreatitis w/ evolution of necrosis per CT on 11/27. Repeat 12/2 CT scan: acute pancreatitis with increased amt of inflammatory stranding and fluid around the pancreas. No definitepseudocyst formation is seen at this time. Will eventually need lap chole.   Afeb past 24 hours, WBC significantly elevated to 78.1 (baseline on admission ~30).  Pt on CRRT. Initially lots of issues with filter clotting but this has improved over past 24 hours.  Plan: Continue meropenem to 1gm IV Q12H Monitor clinical picture, CRRT tolerance F/U LOT  Height: 6' (182.9 cm) Weight: 199 lb 8.3 oz (90.5 kg) IBW/kg (Calculated) : 77.6  Temp (24hrs), Avg:99.1 F (37.3 C), Min:98 F (36.7 C), Max:100 F (37.8 C)  Recent Labs  Lab 11/05/18 0443  11/05/18 1602  11/07/18 0408  11/08/18 1027  11/09/18 0403  11/10/18 0221 11/10/18 0418 11/10/18 0439 11/10/18 0604 11/10/18 0611  WBC 39.6*  --   --   --  62.0*  --  77.7*  --  68.9*  --   --   --  78.1*  --   --   CREATININE  --    < >  --    < > 8.24*   < >  --    < > 4.08*   < > 3.00* 3.10* 3.08* 2.30* 2.90*  LATICACIDVEN  --   --  1.6  --   --   --   --   --   --   --   --   --   --   --   --    < > = values in this interval not displayed.    Estimated Creatinine Clearance: 27.1 mL/min (A) (by C-G formula based on SCr of 2.9 mg/dL (H)).    No Known Allergies  Thank you for allowing pharmacy to be a part of this patient's care.  Sherlon Handing, PharmD, BCPS Clinical pharmacist  **Pharmacist phone directory can now be found on [email or web address] ([email or web address]).  Listed under [place]. 11/10/2018 9:32 AM

## 2018-11-10 NOTE — Progress Notes (Signed)
[facility] Progress Note    Assessment/ Plan:    1. Dialysis dependent AKI from ATN, anuric; off CRRT 11/27, back on 11/30.  Was on fixed dose heparin; now citrate. Change all replacement bags to 4K.  2. Hyperkalemia: resolved with solving clotting probs 3. Septic shock secondary to severe necrotizing pancreatitis; on meropenem and eraxis.  WBC rising still with increasing pressor requirements.  CT abd/ pelvis 12/2 with increased inflammation/ stranding, no pseudocyst formation.   Blood cultures 11/30 negative so far, perhaps consider culturing all lines since on TPN.   4. Acute hypoxic RF: extubated 11/26, reintubated 11/29, s/p trach 12/1 5. Hypocalcemia; stable on 2.5Ca bath; corrects 2/2 hypoalbuminemia-- watch closely on citrate 6. Anemia- transfuse prn 7. Nutrition- on TPN 8. Temp HD cath present since 11/22 9. Dispo: remains in ICU  Subjective:    CT scan with increasing inflammation and stranding around pancreas.  Hgb 6.7 this AM, got 1 u pRBCs.       Objective:   BP 111/68 (BP Location: Right Arm)   Pulse 72   Temp 98 F (36.7 C) (Oral)   Resp 16   Ht 6' (1.829 m)   Wt 90.5 kg   SpO2 100%   BMI 27.06 kg/m   Intake/Output Summary (Last 24 hours) at 11/10/2018 0834 Last data filed at 11/10/2018 0800 Gross per 24 hour  Intake 6331.75 ml  Output 5951 ml  Net 380.75 ml   Weight change: 2.2 kg  Physical Exam: Gen: ill-jaundiced, not responsive to stimuli HEENT + trach without drainage CVS: RRR Resp: rhonchrous bilaterally Abd: soft, distended and tender in epigastrum, not rigid  Ext:1+ diffuse anasarca  Imaging: Ct Abdomen Pelvis Wo Contrast  Result Date: 11/09/2018 CLINICAL DATA:  Abdominal pain, fever. EXAM: CT ABDOMEN AND PELVIS WITHOUT CONTRAST TECHNIQUE: Multidetector CT imaging of the abdomen and pelvis was performed following the standard protocol without IV contrast. COMPARISON:  CT scan of November 04, 2018. FINDINGS: Lower chest: Mild  bilateral pleural effusions are noted with adjacent atelectasis or infiltrates. Hepatobiliary: Minimal cholelithiasis is noted. The liver is unremarkable. No biliary dilatation is noted. Pancreas: Mildly increased inflammatory changes and fluid is noted around the pancreas consistent with acute pancreatitis. Due to the lack of intravenous contrast, pancreatic necrosis cannot be excluded. No definite pseudocyst formation is seen at this time. Spleen: Normal in size without focal abnormality. Adrenals/Urinary Tract: Adrenal glands are unremarkable. Kidneys are normal, without renal calculi, focal lesion, or hydronephrosis. Bladder is unremarkable. Stomach/Bowel: Nasogastric tube is seen in the stomach. There is no evidence of bowel obstruction. The appendix is not visualized. Wall thickening of duodenum is noted consistent with secondary inflammation. Vascular/Lymphatic: Aortic atherosclerosis. No enlarged abdominal or pelvic lymph nodes. Reproductive: Prostate is unremarkable. Other: Mild anasarca is noted. No definite hernia or ascites is noted. Musculoskeletal: No acute or significant osseous findings. IMPRESSION: Findings consistent with acute pancreatitis with increased amount of inflammatory stranding and fluid around the pancreas. No definite pseudocyst formation is seen at this time. Wall thickening of adjacent duodenum is noted consistent with secondary inflammation. Mild anasarca. Aortic Atherosclerosis (ICD10-I70.0). Electronically Signed   By: Marijo Conception, M.D.   On: 11/09/2018 20:49   Dg Abd 1 View  Result Date: 11/08/2018 CLINICAL DATA:  Ileus/constipation. EXAM: ABDOMEN - 1 VIEW COMPARISON:  11/08/2018. FINDINGS: Enteric tube is present with tip unchanged over the right upper quadrant likely over the distal stomach. Bowel gas pattern is nonobstructive with mild fecal retention throughout the colon.  Remainder of the exam is unchanged. IMPRESSION: Nonobstructive bowel gas pattern with mild fecal  retention throughout the colon. Nasogastric tube unchanged. Electronically Signed   By: Marin Olp M.D.   On: 11/08/2018 15:20   Dg Chest Port 1 View  Result Date: 11/09/2018 CLINICAL DATA:  67 year old male with ileus.  Subsequent encounter. EXAM: PORTABLE CHEST 1 VIEW COMPARISON:  11/08/2018 chest x-ray. FINDINGS: Tracheostomy tube tip midline. Right central line tip proximal superior vena cava level. Left central line tip distal superior vena cava level. Persistent consolidation right lung base may represent infiltrate or atelectasis. Less notable findings left lung base. Pulmonary vascular congestion appears stable. Heart size within normal limits.  Calcified aorta. Nasogastric tube courses below diaphragm. Tip not imaged on present exam. No pneumothorax detected. IMPRESSION: 1. Persistent consolidation right lung base may represent infiltrate or atelectasis. Less notable findings left lung base. 2. Pulmonary vascular congestion appears stable. 3. Aortic Atherosclerosis (ICD10-I70.0). 4. No change in position of central lines or tracheostomy. Electronically Signed   By: Genia Del M.D.   On: 11/09/2018 07:02   Dg Chest Port 1 View  Result Date: 11/08/2018 CLINICAL DATA:  Tracheostomy EXAM: PORTABLE CHEST 1 VIEW COMPARISON:  Chest radiograph from one day prior. FINDINGS: Tracheostomy tube tip overlies the tracheal air column at the thoracic inlet. Enteric tube enters stomach with the tip not seen on this image. Right internal jugular central venous catheter terminates in middle third of the SVC. Left internal jugular central venous catheter terminates in the lower third of the SVC. Stable cardiomediastinal silhouette with normal heart size. No pneumothorax. No pleural effusion. No overt pulmonary edema. Hazy bibasilar lung opacities are stable. IMPRESSION: 1. Well-positioned tracheostomy tube and support structures. No pneumothorax. 2. Hazy bibasilar lung opacities, stable, which could represent  atelectasis or pneumonia. Electronically Signed   By: Ilona Sorrel M.D.   On: 11/08/2018 12:56   Dg Abd Portable 1v  Result Date: 11/08/2018 CLINICAL DATA:  Ileus. EXAM: PORTABLE ABDOMEN - 1 VIEW COMPARISON:  11/04/2018 FINDINGS: Enteric tube has tip right of midline likely over the distal stomach. Bowel gas pattern is nonobstructive with mild to moderate fecal retention throughout the colon. Remainder of the exam is unchanged. IMPRESSION: Nonobstructive bowel gas pattern with mild to moderate fecal retention throughout the colon. Enteric tube with tip right of midline over the upper abdomen likely in the distal stomach. Electronically Signed   By: Marin Olp M.D.   On: 11/08/2018 15:19    Labs: BMET Recent Labs  Lab 11/07/18 1525 11/08/18 0452 11/08/18 1320 11/08/18 1641  11/09/18 0403  11/09/18 1600  11/10/18 0026 11/10/18 0212 11/10/18 0221 11/10/18 0418 11/10/18 0439 11/10/18 0604 11/10/18 0611  NA 138 137 134* 136   < > 133*   < > 132*   < > 133* 135 135 132* 132* 134* 132*  K 5.4* 5.8* 5.4* 5.2*   < > 4.6   < > 4.2   < > 4.2 3.7 3.6 4.0 3.8 3.6 3.6  CL 102 100 100 99   < > 94*   < > 89*   < > 91* 90* 94* 88* 88* 85* 87*  CO2 21* 21* 21* 25  --  26  --  25  --   --   --   --   --  27  --   --   GLUCOSE 188* 117* 131* 83   < > 305*   < > 365*   < > 271*  291* 260* 320* 317* 339* 328*  BUN 103* 75* 66* 62*   < > 59*   < > 46*   < > 41* 34* 36* 40* 44* 34* 39*  CREATININE 6.75* 4.90* 4.25* 4.32*   < > 4.08*   < > 3.10*   < > 3.20* 2.60* 3.00* 3.10* 3.08* 2.30* 2.90*  CALCIUM 6.0* 7.0* 7.0* 7.2*  --  7.3*  --  8.3*  --   --   --   --   --  8.8*  --   --   PHOS 5.0* 5.5* 4.7* 4.8*  --  4.0  --  3.7  --   --   --   --   --  3.4  3.3  --   --    < > = values in this interval not displayed.   CBC Recent Labs  Lab 11/05/18 0443 11/07/18 0408 11/08/18 1027  11/09/18 0403  11/10/18 0418 11/10/18 0439 11/10/18 0604 11/10/18 0611  WBC 39.6* 62.0* 77.7*  --  68.9*  --   --   78.1*  --   --   NEUTROABS 33.3*  --   --   --  46.8*  --   --   --   --   --   HGB 8.1* 8.1* 7.9*   < > 7.3*   < > 7.8* 6.7* 9.2* 7.8*  HCT 25.4* 24.6* 24.8*   < > 22.6*   < > 23.0* 20.8* 27.0* 23.0*  MCV 87.3 85.1 87.6  --  86.9  --   --  86.0  --   --   PLT 129* 149* 170  --  165  --   --  183  --   --    < > = values in this interval not displayed.    Medications:    . sodium chloride   Intravenous Once  . chlorhexidine gluconate (MEDLINE KIT)  15 mL Mouth Rinse BID  . heparin injection (subcutaneous)  5,000 Units Subcutaneous Q8H  . insulin aspart  0-15 Units Subcutaneous Q4H  . mouth rinse  15 mL Mouth Rinse 10 times per day  . pantoprazole (PROTONIX) IV  40 mg Intravenous Daily  . sodium chloride flush  10-40 mL Intracatheter Q12H      Madelon Lips, MD 11/10/2018, 8:34 AM

## 2018-11-10 NOTE — Progress Notes (Signed)
Nutrition Follow-up  DOCUMENTATION CODES:   Not applicable  INTERVENTION:    Consider beginning jejunal feedings. Cortrak service available M-W-F-Sat 8am-4pm.  When jejunal feeding tube placed, recommend start TF with Vital AF 1.2 at 20 ml/h, increase as tolerated by 10 ml every 4 hours to goal rate of 65 ml/h with Pro-stat 30 ml BID (2072 kcal, 147 gm protein, 1265 ml free water daily.  Continue TPN for now until jejunal tube is placed.    NUTRITION DIAGNOSIS:   Inadequate oral intake related to inability to eat as evidenced by NPO status.  Ongoing  GOAL:   Patient will meet greater than or equal to 90% of their needs  Met with TPN  MONITOR:   Diet advancement, Vent status, Labs, Weight trends, I & O's, TF tolerance(GOC)  ASSESSMENT:   67 y/o male PMHx htn/hld, dm2. Presented w/ abd pain that began suddenly 1 day PTA. Supportive care from PCP ineffective and presented to ED w/ vomiting. Determined to have severe gallstone pancreatitis w/ related AKI. Decompensated after arrival, developing  worsening renal failure. On 11/22 transfered to ICU and required intubation. Begun on pressors and CRRT.    Currently receiving TPN for severe pancreatitis at 75 ml/h providing 2180 kcal, 140 gm protein; meeting 100% of estimated needs.    NGT to suction: 150 ml output since midnight.   Continues on CRRT.   Patient is currently intubated on ventilator support MV: 13 L/min Temp (24hrs), Avg:98.5 F (36.9 C), Min:98 F (36.7 C), Max:99.7 F (37.6 C)   Labs reviewed. Sodium 132 (L) CBG's: [phone] IV insulin added today Medications reviewed and include levophed.   Diet Order:   Diet Order            Diet NPO time specified  Diet effective now              EDUCATION NEEDS:   No education needs have been identified at this time  Skin:  Skin Assessment: Reviewed RN Assessment  Last BM:  12/2 (type 6)  Height:   Ht Readings from Last 1 Encounters:  11/06/18  6' (1.829 m)    Weight:   Wt Readings from Last 1 Encounters:  11/10/18 90.5 kg    Ideal Body Weight:  80.91 kg  BMI:  Body mass index is 27.06 kg/m.  Estimated Nutritional Needs:   Kcal:  2125  Protein:  140-180 gm  Fluid:  2.1-2.3 L    Molli Barrows, RD, LDN, CNSC Pager [phone] After Hours Pager [phone]

## 2018-11-10 NOTE — Progress Notes (Signed)
[place] Progress Note Patient Name: CAETANO OBERHAUS DOB: 1951/03/31 MRN: [id number]   Date of Service  11/10/2018  HPI/Events of Note  Anemia - Hgb = 6.7.  eICU Interventions  Will transfuse 1 unit PRBC.     Intervention Category Major Interventions: Other:  Lysle Dingwall 11/10/2018, 5:47 AM

## 2018-11-10 NOTE — Progress Notes (Signed)
PT Cancellation Note  Patient Details Name: Derek Riley MRN: [id number] DOB: 10-09-1951   Cancelled Treatment:    Reason Eval/Treat Not Completed: Patient not medically ready. Nursing reports pt not stable for PT.   Shary Decamp [facility] 11/10/2018, 11:27 AM [place] Pager [phone] Office [phone]

## 2018-11-11 ENCOUNTER — Inpatient Hospital Stay (HOSPITAL_COMMUNITY): Payer: Medicare Other

## 2018-11-11 DIAGNOSIS — J9811 Atelectasis: Secondary | ICD-10-CM

## 2018-11-11 DIAGNOSIS — Z9911 Dependence on respirator [ventilator] status: Secondary | ICD-10-CM

## 2018-11-11 DIAGNOSIS — I10 Essential (primary) hypertension: Secondary | ICD-10-CM

## 2018-11-11 DIAGNOSIS — D72829 Elevated white blood cell count, unspecified: Secondary | ICD-10-CM

## 2018-11-11 DIAGNOSIS — T17900A Unspecified foreign body in respiratory tract, part unspecified causing asphyxiation, initial encounter: Secondary | ICD-10-CM

## 2018-11-11 DIAGNOSIS — T17908A Unspecified foreign body in respiratory tract, part unspecified causing other injury, initial encounter: Secondary | ICD-10-CM

## 2018-11-11 DIAGNOSIS — K8511 Biliary acute pancreatitis with uninfected necrosis: Secondary | ICD-10-CM

## 2018-11-11 DIAGNOSIS — K567 Ileus, unspecified: Secondary | ICD-10-CM

## 2018-11-11 DIAGNOSIS — Z95828 Presence of other vascular implants and grafts: Secondary | ICD-10-CM

## 2018-11-11 DIAGNOSIS — R918 Other nonspecific abnormal finding of lung field: Secondary | ICD-10-CM

## 2018-11-11 DIAGNOSIS — E785 Hyperlipidemia, unspecified: Secondary | ICD-10-CM

## 2018-11-11 DIAGNOSIS — R509 Fever, unspecified: Secondary | ICD-10-CM

## 2018-11-11 LAB — GLUCOSE, CAPILLARY
GLUCOSE-CAPILLARY: 183 mg/dL — AB (ref 70–99)
GLUCOSE-CAPILLARY: 158 mg/dL — AB (ref 70–99)
GLUCOSE-CAPILLARY: 171 mg/dL — AB (ref 70–99)
Glucose-Capillary: 153 mg/dL — ABNORMAL HIGH (ref 70–99)
Glucose-Capillary: 154 mg/dL — ABNORMAL HIGH (ref 70–99)
Glucose-Capillary: 180 mg/dL — ABNORMAL HIGH (ref 70–99)
Glucose-Capillary: 181 mg/dL — ABNORMAL HIGH (ref 70–99)
Glucose-Capillary: 187 mg/dL — ABNORMAL HIGH (ref 70–99)
Glucose-Capillary: 192 mg/dL — ABNORMAL HIGH (ref 70–99)
Glucose-Capillary: 196 mg/dL — ABNORMAL HIGH (ref 70–99)
Glucose-Capillary: 204 mg/dL — ABNORMAL HIGH (ref 70–99)
Glucose-Capillary: 204 mg/dL — ABNORMAL HIGH (ref 70–99)
Glucose-Capillary: 218 mg/dL — ABNORMAL HIGH (ref 70–99)
Glucose-Capillary: 138 mg/dL — ABNORMAL HIGH (ref 70–99)
Glucose-Capillary: 151 mg/dL — ABNORMAL HIGH (ref 70–99)
Glucose-Capillary: 152 mg/dL — ABNORMAL HIGH (ref 70–99)
Glucose-Capillary: 170 mg/dL — ABNORMAL HIGH (ref 70–99)

## 2018-11-11 LAB — POCT I-STAT, CHEM 8
BUN: 30 mg/dL — AB (ref 8–23)
BUN: 36 mg/dL — AB (ref 8–23)
CHLORIDE: 86 mmol/L — AB (ref 98–111)
Calcium, Ion: 0.32 mmol/L — CL (ref 1.15–1.40)
Calcium, Ion: 1.18 mmol/L (ref 1.15–1.40)
Chloride: 85 mmol/L — ABNORMAL LOW (ref 98–111)
Creatinine, Ser: 1.8 mg/dL — ABNORMAL HIGH (ref 0.61–1.24)
Creatinine, Ser: 2.3 mg/dL — ABNORMAL HIGH (ref 0.61–1.24)
Glucose, Bld: 173 mg/dL — ABNORMAL HIGH (ref 70–99)
Glucose, Bld: 246 mg/dL — ABNORMAL HIGH (ref 70–99)
HCT: 28 % — ABNORMAL LOW (ref 39.0–52.0)
HCT: 30 % — ABNORMAL LOW (ref 39.0–52.0)
Hemoglobin: 10.2 g/dL — ABNORMAL LOW (ref 13.0–17.0)
Hemoglobin: 9.5 g/dL — ABNORMAL LOW (ref 13.0–17.0)
Potassium: 3.2 mmol/L — ABNORMAL LOW (ref 3.5–5.1)
Potassium: 3.4 mmol/L — ABNORMAL LOW (ref 3.5–5.1)
Sodium: 135 mmol/L (ref 135–145)
Sodium: 137 mmol/L (ref 135–145)
TCO2: 36 mmol/L — ABNORMAL HIGH (ref 22–32)
TCO2: 39 mmol/L — AB (ref 22–32)

## 2018-11-11 LAB — CBC
HCT: 23.7 % — ABNORMAL LOW (ref 39.0–52.0)
Hemoglobin: 7.9 g/dL — ABNORMAL LOW (ref 13.0–17.0)
MCH: 28.2 pg (ref 26.0–34.0)
MCHC: 33.3 g/dL (ref 30.0–36.0)
MCV: 84.6 fL (ref 80.0–100.0)
Platelets: 170 10*3/uL (ref 150–400)
RBC: 2.8 MIL/uL — ABNORMAL LOW (ref 4.22–5.81)
RDW: 15.4 % (ref 11.5–15.5)
WBC: 61.7 10*3/uL (ref 4.0–10.5)
nRBC: 1 % — ABNORMAL HIGH (ref 0.0–0.2)

## 2018-11-11 LAB — HEPATIC FUNCTION PANEL
ALT: 26 U/L (ref 0–44)
AST: 168 U/L — ABNORMAL HIGH (ref 15–41)
Albumin: 1.3 g/dL — ABNORMAL LOW (ref 3.5–5.0)
Alkaline Phosphatase: 193 U/L — ABNORMAL HIGH (ref 38–126)
BILIRUBIN DIRECT: 3 mg/dL — AB (ref 0.0–0.2)
Indirect Bilirubin: 1.5 mg/dL — ABNORMAL HIGH (ref 0.3–0.9)
Total Bilirubin: 4.5 mg/dL — ABNORMAL HIGH (ref 0.3–1.2)
Total Protein: 7.8 g/dL (ref 6.5–8.1)

## 2018-11-11 LAB — BPAM RBC
Blood Product Expiration Date: 201912082359
ISSUE DATE / TIME: 201912030924
Unit Type and Rh: 7300

## 2018-11-11 LAB — RENAL FUNCTION PANEL
Albumin: 1.2 g/dL — ABNORMAL LOW (ref 3.5–5.0)
Albumin: 1.3 g/dL — ABNORMAL LOW (ref 3.5–5.0)
Anion gap: 16 — ABNORMAL HIGH (ref 5–15)
Anion gap: 20 — ABNORMAL HIGH (ref 5–15)
BUN: 38 mg/dL — ABNORMAL HIGH (ref 8–23)
BUN: 38 mg/dL — ABNORMAL HIGH (ref 8–23)
CO2: 35 mmol/L — ABNORMAL HIGH (ref 22–32)
CO2: 33 mmol/L — ABNORMAL HIGH (ref 22–32)
Calcium: 10.3 mg/dL (ref 8.9–10.3)
Calcium: 10.7 mg/dL — ABNORMAL HIGH (ref 8.9–10.3)
Chloride: 86 mmol/L — ABNORMAL LOW (ref 98–111)
Chloride: 85 mmol/L — ABNORMAL LOW (ref 98–111)
Creatinine, Ser: 2.41 mg/dL — ABNORMAL HIGH (ref 0.61–1.24)
Creatinine, Ser: 2.24 mg/dL — ABNORMAL HIGH (ref 0.61–1.24)
GFR calc Af Amer: 31 mL/min — ABNORMAL LOW (ref >60)
GFR calc Af Amer: 34 mL/min — ABNORMAL LOW (ref >60)
GFR calc non Af Amer: 27 mL/min — ABNORMAL LOW (ref >60)
GFR calc non Af Amer: 29 mL/min — ABNORMAL LOW (ref >60)
GLUCOSE: 159 mg/dL — AB (ref 70–99)
Glucose, Bld: 166 mg/dL — ABNORMAL HIGH (ref 70–99)
Phosphorus: 2.1 mg/dL — ABNORMAL LOW (ref 2.5–4.6)
Phosphorus: 2.2 mg/dL — ABNORMAL LOW (ref 2.5–4.6)
Potassium: 3.2 mmol/L — ABNORMAL LOW (ref 3.5–5.1)
Potassium: 3.3 mmol/L — ABNORMAL LOW (ref 3.5–5.1)
SODIUM: 137 mmol/L (ref 135–145)
Sodium: 138 mmol/L (ref 135–145)

## 2018-11-11 LAB — MAGNESIUM: Magnesium: 1.9 mg/dL (ref 1.7–2.4)

## 2018-11-11 LAB — TYPE AND SCREEN
ABO/RH(D): B POS
Antibody Screen: NEGATIVE
Unit division: 0

## 2018-11-11 LAB — PHOSPHORUS: Phosphorus: 2.2 mg/dL — ABNORMAL LOW (ref 2.5–4.6)

## 2018-11-11 LAB — CALCIUM, IONIZED: Calcium, Ionized, Serum: 4.5 mg/dL (ref 4.5–5.6)

## 2018-11-11 MED ORDER — PRO-STAT SUGAR FREE PO LIQD
30.0000 mL | Freq: Two times a day (BID) | ORAL | Status: DC
  Administered 2018-11-11 – 2018-11-25 (×24): 30 mL
  Filled 2018-11-11 (×23): qty 30

## 2018-11-11 MED ORDER — TRACE MINERALS CR-CU-MN-SE-ZN 10-1000-500-60 MCG/ML IV SOLN
INTRAVENOUS | Status: DC
  Administered 2018-11-11: 18:00:00 via INTRAVENOUS
  Filled 2018-11-11: qty 748.8

## 2018-11-11 MED ORDER — POTASSIUM CHLORIDE 10 MEQ/50ML IV SOLN
10.0000 meq | Freq: Once | INTRAVENOUS | Status: AC
  Administered 2018-11-11: 10 meq via INTRAVENOUS
  Filled 2018-11-11: qty 50

## 2018-11-11 MED ORDER — VITAL AF 1.2 CAL PO LIQD
1000.0000 mL | ORAL | Status: DC
  Administered 2018-11-11: 20 mL
  Administered 2018-11-13 – 2018-11-14 (×3): 1000 mL

## 2018-11-11 NOTE — Progress Notes (Addendum)
PT Cancellation Note  Patient Details Name: Derek Riley MRN: [id number] DOB: 1951-06-04   Cancelled Treatment:    Reason Eval/Treat Not Completed: Fatigue/lethargy limiting ability to participate. RT currently working with patient in room, RN advised pt still sedated/lethargic on vent and to consider attempting tomorrow. Will cont to follow.   Reinaldo Berber, PT, DPT Acute Rehabilitation Services Pager: [phone] Office: [phone]     Reinaldo Berber 11/11/2018, 6:03 PM

## 2018-11-11 NOTE — Procedures (Signed)
Cortrak  Person Inserting Tube:  Maylon Peppers C, RD Tube Type:  Cortrak - 43 inches Tube Location:  Left nare Initial Placement:  Postpyloric Secured by: Bridle Technique Used to Measure Tube Placement:  Documented cm marking at nare/ corner of mouth Cortrak Secured At:  115 cm    Cortrak Tube Team Note:  Consult received to place a Cortrak feeding tube.  NG tube remains in place in R nare.    X-ray is required, abdominal x-ray has been ordered by the Cortrak team. Please confirm tube placement before using the Cortrak tube.   If the tube becomes dislodged please keep the tube and contact the Cortrak team at [email or web address] ([email or web address]) for replacement.  If after hours and replacement cannot be delayed, place a NG tube and confirm placement with an abdominal x-ray.    [place], [place], [place] Pager [phone] After Hours Pager

## 2018-11-11 NOTE — Progress Notes (Signed)
PHARMACY - ADULT TOTAL PARENTERAL NUTRITION CONSULT NOTE   Pharmacy Consult:  TPN Indication: Necrotizing pancreatitis  Patient Measurements: Height: 6' (182.9 cm) Weight: 202 lb 2.6 oz (91.7 kg) IBW/kg (Calculated) : 77.6 TPN AdjBW (KG): 87.4 Body mass index is 27.42 kg/m. Usual Weight: 88 kg  Assessment:  25 YOM with PMH of DM, gallstones, and HTN. Originally admitted to [facility] on 11/20 after transfer d/t acute gallstone pancreatitis. ERCP on 10/29/18 showed extensive pancreatitis and possible hemorrhagic peripancreatic fluid. Patient was moved to ICU on 11/22 and received CRRT unitl 11/27. CT abdomen 11/26 showed necrotizing pancreatitis.  Patient decompensated 11/06/18 requiring reintubation and restart of pressors; CRRT restarted 11/07/18.  GI: Pending cholecystectomy when pancreatitis has resolved. Baseline prealbumin < 5 remains <5. Albumin down to 1.2. NG O/P 171m (down), BM 12/2. RD recommending consideration of jejunal feedings soon?  PPI IV, PRN Zofran Endo: DM on metformin PTA.  CBGs uncontrolled (150-350s) but improving on insulin gtt. Is on several infusions containing dextrose outside of TPN as well. Insulin requirements in the past 24 hours: Now on insulin gtt along with 92 units in TPN (on steroids) Lytes: wnl exc K low at 3.2, Cl low at 85, Bicarb high at 35, Phos low at 2.2. CoCa elevated at 12.5, but ionized Ca low at 0.32. Calcium Gluconate infusion with CRRT Renal: Continues on CRRT since 11/30. Severe AKI from ATN, s/p 5d CRRT >> iHD but didn't tolerate on 11/29 d/t hypotension. No UOP. Severe metabolic acidosis. BUN down to 30. Pulm: intubated 11/22>>11/26, reintubated 11/29. Trach placed 12/1. Cards: Continues on vasopressin, Norepi inf Hepatobil: AST/alk phos trending up, ALT WNL, tbili elevated 4 (jaundice per RN), TG down to 138. Neuro:Sedated on the vent. GCS 13, CPOT 0, RASS -1 (goal -1) Precedex, Fentanyl ID: Merrem/Eraxis for necrotizing pancreatitis, cover for  aspiration, s/p Zosyn - Afebrile over last 24 hrs. WBC elevated at 61.7.  TPN Access: HD cath pigtail + CVC triple lumen placed 12/06/18 TPN start date: 11/05/18  Nutritional Goals (per RD rec on 12/4): Kcal: 2125 Protein: 140-180 g Fluid:  2.1-2.3 L  Current Nutrition:  NPO TPN  Plan:  Decrease TPN slightly to 631mhr This TPN provides 112 g of protein, 252 g of dextrose, and 44g of lipids for a total of 1,744 kcals meeting ~80% of patient needs. Decrease in TPN today is for planned addition of TFs so that we do not significantly overfeed later today and into tomorrow. Electrolytes in TPN: no electrolytes Na, add low K and Phos; Max Cl Add MVI, 1/2 trace elements (jaundice), and 80 units of regular insulin to TPN Continue insulin gtt per MD. No SSI currently Monitor TPN labs  Plan for Cortrak placement today and to start TFs and prostat supplement F/U tolerance and ability to wean TPN off  One run of KCl ordered per MD   NaElenor QuinonesPharmD, BCPS, BCWhiteriver Indian Hospitallinical Pharmacist Phone number #[phone]/03/2018 7:33 AM

## 2018-11-11 NOTE — Progress Notes (Signed)
SLP Cancellation Note  Patient Details Name: Derek Riley MRN: [id number] DOB: [birth year]   Cancelled treatment:        Patient remains inappropriate for evaluation, on full vent support and requiring sedation for agitation limiting ability to participate even in in-line PMV trials. Will f/u.   Sarea Fyfe MA, CCC-SLP    Palmer Fahrner Meryl 11/11/2018, 10:20 AM

## 2018-11-11 NOTE — Progress Notes (Signed)
[facility] Progress Note Patient Name: Derek Riley DOB: 07/13/1951 MRN: [id number]   Date of Service  11/11/2018  HPI/Events of Note  Diffuse ST segment changes suggestive of possible pericarditis in patient with severe pancreatitis.  eICU Interventions  Troponin x 3, ECHO complete, Cardiology consultation        Frederik Pear 11/11/2018, 11:27 PM

## 2018-11-11 NOTE — Progress Notes (Signed)
NAME:  Derek Riley, MRN:  [id number], DOB:  Sep 15, 1951, LOS: 21 ADMISSION DATE:  11/02/2018, CONSULTATION DATE:  11/22 REFERRING MD:  11/22, CHIEF COMPLAINT:  Worsening metabolic acidosis and renal failure w/ clinical decline  Brief History   67 year old male admitted 11/20 w/ gallstone pancreatitis. PCCM consulted 11/22 for progressive metabolic acidosis, acute renal failure and persistent clinical decline.   Past Medical History  Diabetes, gallstones, hypertension.  Significant Hospital Events   11/20 transferred to [facility] from [place] w/ acute gallstone pancreatitis. GI & surgicals services consulted. Started On supportive IVFs, empiric zosyn and analgesic support.  Surgical service is planning on cholecystectomy once pancreatitis improved.  Initial lipase on arrival to [place], creatinine 1.72 11/21: MRCP 11/21 showing extensive pancreatitis, possible hemorrhagic peripancreatic fluid no choledocholithiasis, lipase 3361, creatinine up to 4.91, anion gap 13.  No role for ERCP continued supportive care 11/22: Creatinine up to 6.95, gap now 16, progressive diffuse mottling, worse pain, critical care consulted for shock, and progressive multiorgan failure, moving to intensive care for CRRT as well as hemodynamic support. 3.7 liters + since admit, developed worsening resp failure requiring intubation shortly after arrival. Possible aspiration during intubation  11/26 started on meropenem (d/t rising CBC) CRRT stopped 11/27 extubated, pressors off.  11/28 look weaker. WIll probably need re-intubation. 11/29 reintubated for increasing WOB, likely aspiration even overnight.  11/30: Remains intubated.  White blood cell count now up to 62,000.  CRRT resumed 12/1: No significant change.  Ongoing cuff leak opting for early tracheostomy versus endotracheal tube change.  12/2: tolerating PS wean at 10/5, CT of the abdomen/pelvis ordered  Consults:  11/20 GI and surgical services 11/22  critical care  Procedures:  11/22 Foley catheter 11/22 right IJ CVL (HD)>>> 11/22 OETT 11/22>>>12/1 Trach 12/1 (JY)>>>  Significant Diagnostic Tests:  MRCP 11/21: Acute pancreatitis with extensive pancreatic and periPancreatic inflammatory changes, including proteinaceous and/or hemorrhagic peri-pancreatic fluid.  No choledocholithiasis or biliary tract obstruction.  Small volume of ascites, trace bilateral pleural effusions.  Bilateral basilar volume loss  Micro Data:  Cultures negative  Antimicrobials:  Zosyn 11/20>>>11/26 Meropenem 11/26>>>  Interim history/subjective:  No events overnight, remains on pressors Agitated when off precedex  Objective   Blood pressure 124/64, pulse 72, temperature (!) 97.3 F (36.3 C), temperature source Oral, resp. rate 15, height 6' (1.829 m), weight 91.7 kg, SpO2 99 %.    Vent Mode: PRVC FiO2 (%):  [30 %] 30 % Set Rate:  [18 bmp] 18 bmp Vt Set:  [277 mL] 620 mL PEEP:  [5 cmH20] 5 cmH20 Plateau Pressure:  [21 cmH20-25 cmH20] 23 cmH20   Intake/Output Summary (Last 24 hours) at 11/11/2018 1026 Last data filed at 11/11/2018 1000 Gross per 24 hour  Intake 7019.21 ml  Output 6819 ml  Net 200.21 ml   Filed Weights   11/09/18 0500 11/10/18 0452 11/11/18 0500  Weight: 88.3 kg 90.5 kg 91.7 kg   Examination: General: Acutely ill appearing male, sedate but arousable and follows command HEENT: [place]/AT, PERRL, EOM-I and MMM, size 6 cuffed trach in place Pulmonary: Coarse BS diffusely Cardiac: RRR, Nl S1/S2 and -M/R/G Abdomen: Soft, mildly tender diffusely, ND and +BS GU: Yellow urine Neuro: Sedate but arousable, following commands Skin: mild jaundice, intact  I reviewed CT of the abdomen myself, no fluid collections noted to be suspicious of abscess  Resolved Hospital Problem list     Assessment & Plan:   Acute gallstone pancreatitis development of creatinine necrosis -No  evidence of choledocholelithiasis on MRCP Plan NPO TPN Day #8  meropenem empirically started for necrotic pancreas and concern for secondary infection also cross covering for aspiration event  PRN analgesia  Eventually needs cholecystectomy but after acute phase of pancreatitis has resolved No surgical interventions for now, discussed with surgery  Ileus Plan CT of the abdomen today for ?ileus given extremely high WBC, no abscess noted Fleets enema  Ongoing leukocytosis in setting of pancreatic inflammation Plan Repeat CBC this a.m. C. difficile negative CT as above  Acute hypoxic respiratory failure requiring mechanical ventilation in the setting of acute aspiration event 11/29, progressive atelectasis, and decreased abdominal compliance from pain and impaired cough Portable chest x-ray from 1/30: This demonstrated worsening right basilar airspace disease most likely representing atelectasis plus minus aspiration event He has an ongoing cuff leak, spoke with family they are agreeable to tracheostomy today Plan PAD protocol as ordered Change RASS to 0 to -1 Begin PS trials as able and may proceed to TC if able Daily assessment for spontaneous breathing trial following trach Wean oxygen for sat of 88-92% VAP bundle CXR to PRN at this point  Severe deconditioning and protein calorie malnutrition Plan Continue TPN Will need LTAC pending hemodynamics and dialysis needs TPN to be determined by surgery as to when start TF  Acute kidney injury with baseline creatinine 1.  This was hemodynamically mediated plus/minus contrast-induced nephropathy Plan Nephrology following Continuing CRRT - negative balance at this point Follow-up chemistries  Ongoing hypotension: Appears to be drug related from narcotics Plan Norepi and vaso for MAP of 65 mmHg Cortisol level Stress dose steroids  Hyperglycemia Plan Sliding scale insulin  Intermittent fluid and electrolyte imbalance: Hyperkalemia Plan Monitor and replace as indicated Electrolytes  addressed via dialysis  The patient is critically ill with multiple organ systems failure and requires high complexity decision making for assessment and support, frequent evaluation and titration of therapies, application of advanced monitoring technologies and extensive interpretation of multiple databases.   Critical Care Time devoted to patient care services described in this note is  34  Minutes. This time reflects time of care of this signee Dr Jennet Maduro. This critical care time does not reflect procedure time, or teaching time or supervisory time of PA/NP/Med student/Med Resident etc but could involve care discussion time.  Rush Farmer, M.D. [facility]/Critical Care Medicine. Pager: [phone]. After hours pager: [phone].

## 2018-11-11 NOTE — Progress Notes (Addendum)
Nutrition Follow-up  DOCUMENTATION CODES:   Not applicable  INTERVENTION:   Begin TF via Cortrak tube:  Vital AF 1.2 at 20 ml/h, increase as tolerated by 10 ml every 4 hours to goal rate of 65 ml/h   Pro-stat 30 ml BID   Provides 2072 kcal, 147 gm protein, 1265 ml free water daily  Continue TPN until TF tolerance established   NUTRITION DIAGNOSIS:   Inadequate oral intake related to inability to eat as evidenced by NPO status.  Ongoing   GOAL:   Patient will meet greater than or equal to 90% of their needs  Met with TPN  MONITOR:   Diet advancement, Vent status, Labs, Weight trends, I & O's, TF tolerance(GOC)   ASSESSMENT:   67 y/o male PMHx htn/hld, dm2. Presented w/ abd pain that began suddenly 1 day PTA. Supportive care from PCP ineffective and presented to ED w/ vomiting. Determined to have severe gallstone pancreatitis w/ related AKI. Decompensated after arrival, developing  worsening renal failure. On 11/22 transfered to ICU and required intubation. Begun on pressors and CRRT.    Discussed patient's nutrition care plan with CCM. Cortrak tube placed by Cortrak RD this morning; tip confirmed with X-ray to be at the ligament of [place]itz.   Currently receiving TPN for severe pancreatitis at 75 ml/h providing 2180 kcal, 140 gm protein; meeting 100% of estimated needs.    Patient remains intubated on ventilator support.  CRRT continues.  NGT to suction: 150 ml total output yesterday.    Labs and medications reviewed. Remains on levophed and IV insulin.  Diet Order:   Diet Order            Diet NPO time specified  Diet effective now              EDUCATION NEEDS:   No education needs have been identified at this time  Skin:  Skin Assessment: Reviewed RN Assessment  Last BM:  12/2 (type 6)  Height:   Ht Readings from Last 1 Encounters:  11/06/18 6' (1.829 m)    Weight:   Wt Readings from Last 1 Encounters:  11/11/18 91.7 kg    Ideal Body  Weight:  80.91 kg  BMI:  Body mass index is 27.42 kg/m.  Estimated Nutritional Needs:   Kcal:  2125  Protein:  140-180 gm  Fluid:  2.1-2.3 L    Molli Barrows, RD, LDN, CNSC Pager [phone] After Hours Pager [phone]

## 2018-11-11 NOTE — Consult Note (Signed)
[facility] for Infectious Disease    Date of Admission:  10/17/2018   Total days of antibiotics 14        Day 5 anidulafungin         Day 9 meropenem        Day 5  Piperacillin-tazobactam  (stopped)       Reason for Consult: Leukocytosis/fever in severe pancreatitis patient     Referring Provider: Nelda Marseille Primary Care Provider: Cher Nakai, MD   Assessment: 67 y.o. male with severe pancreatitis (?gallstone pancreatitis - MRCP negative; no ERCP done), necrotizing now on hospital day 14 with worsening leukocytosis (up to 78K) and fevers to 103 F despite escalation of antibiotics to carbapenem and addition of antifungal. He has remained afebrile since 12/02 afternoon, although he is on CRRT that can mask. WBC improved today 78.1 >> 61.7K. Would expect leukocytosis and fever even in setting of even sterile necrotizing pancreatitis. Would continue supportive care at this time and consider stopping empiric antibiotics/antifungals. No history of MDROs and no previous antibiotic use or hospitalization prior to this event. All micro data has been negative at this point, pressor requirement is decreasing - PCCM  feels is more r/t narcotics and sedatives and they are currently weaning off. CXR personally reviewed revealing RLL atelectasis with possible aspiration pneumonitis/pneumonia.    Plan: 1. CT-guided FNA of pancreatic fluid to determine further role for antibiotics  2. ? Role for percutaneous tube in this setting 3. Would consider stopping antibiotics - Dr. Algis Downs recommendations to follow   Active Problems:   Acute gallstone pancreatitis   HTN (hypertension)   AKI (acute kidney injury) ([place])   Hyperglycemia   Hyperlipidemia   Aspiration pneumonia ([place])   Acute hypoxemic respiratory failure ([place])   Status post tracheostomy ([place])   . chlorhexidine gluconate (MEDLINE KIT)  15 mL Mouth Rinse BID  . heparin injection (subcutaneous)  5,000 Units Subcutaneous Q8H  .  hydrocortisone sodium succinate  50 mg Intravenous Q6H  . mouth rinse  15 mL Mouth Rinse 10 times per day  . pantoprazole (PROTONIX) IV  40 mg Intravenous Daily  . sodium chloride flush  10-40 mL Intracatheter Q12H    HPI: Derek Riley is a 67 y.o. male admitted from home on 10/29/2018 with abdominal pain.   He has a pmhx significant for HTN, HLD. At presentation to [facility] ED he had sudden ad severe diffuse abdominal pain that started 1 day prior. He was seen by his primary doctor and recommended supportive care at that time. Denied any fevers/chills. At [facility] work he was hemodynamically stable (hypertensive actually); WBC 9.1K, Creatinine 1.0, ALT 305 AST 593 TBili 3.2 Lipase 50,341. CT of the abd revealed severe edema of the pancrease c/w pancreatitis w/o fluid collection, gallbladder wall thickening concerning for cholecystitis or reactive inflammation; it was unclear If there was obstructive stones, no biliary dilation noted.  Started on piperacillin-tazobactam 11/20. Surgery team recommended lap cholecystectomy after resolution of acute infection. MRCP 10/29/18 with acute pancreatitis with extensive pancreatic/peripancreatic inflammatory changes including proteinaceous and/or hemorrhagic peripancreatic fluid. No Choledocholithiasis or biliary obstruction.   He was moved to ICU on 11/22 due to respiratory distress, requiring intubation and worsening kidney function, requiring CRRT through 11/04/18.   On 11/04/18 his leukocytosis worsened acutely (24 >> 39.6K). Required repeated intubation this day as well d/t weak respiratory effort. Repeated CT of the abdomen was obtained 11/04/18 revealing a short segment of pancreatic body does  not enhance suggesting short segment of pancreatic necrosis; no organized intrapancreatic or peripancreatic fluid collection. Retroperitoneal edema/fluid with trace fluid in each paracolic gutter. Piperacillin-tazobactam was changed to Meropenem at that  time. Fevers to 75 F on 11/29 with continued elevation of WBC to 78.1. Anidulafungin was added on 11/30 due to this. Repeat CT A/P on 11/09/18 revealed mildly increased inflammatory changes and fluid is noted around the pancreas consistent with acute pancreatitis. Due to the lack of intravenous contrast, pancreatic necrosis cannot be excluded. No definite pseudocyst formation is seen at this time. His kidney function has worsened with creatinine now 2.3  Micro Data:  BCx 11/23 >> no growth  BCx 11/30 >> no growth  CDiff 11/30 >> toxin and antigen negative  He currently is still intubated HD catheter in place in L IJ where he is receiving TPN as well as IVF/Meds/Pressors. Underwent tracheostomy placement 12/01. In speaking with his nurse there have been multiple episodes of vomiting concerning for aspiration events.   I spoke with his brother and sister-in-law - no previous history of pancreatitis, non-drinker, non-drug consumer. No new medications from what he understands prior to current illness. He describes Louie Casa to have "always had stomach troubles" but over the last few weeks they worsened quickly. He was on some medication for this apparently per his account but does not recall what.   Review of Systems: Review of Systems  Unable to perform ROS: Medical condition    Past Medical History:  Diagnosis Date  . Diabetes mellitus without complication ([place])   . Gallstone 10/2018  . Hyperlipidemia   . Hypertension     Social History   Tobacco Use  . Smoking status: Never Smoker  . Smokeless tobacco: Never Used  Substance Use Topics  . Alcohol use: Never    Frequency: Never  . Drug use: Never    Family History  Problem Relation Age of Onset  . Pancreatitis Neg Hx    No Known Allergies  OBJECTIVE: Blood pressure 124/64, pulse 72, temperature (!) 97.3 F (36.3 C), temperature source Oral, resp. rate 15, height 6' (1.829 m), weight 91.7 kg, SpO2 99 %.  Physical Exam    Constitutional: He appears well-developed.  Upright in bed resting. Waves briefly to family and then tries to gently pull at NGTs.   HENT:  Mouth/Throat: Oropharynx is clear and moist and mucous membranes are normal. Normal dentition. No dental abscesses.  Eyes: Pupils are equal, round, and reactive to light. Scleral icterus is present.  Cardiovascular: Normal rate, regular rhythm and normal heart sounds. Exam reveals decreased pulses (LEs ).  No murmur heard. Pulmonary/Chest: Effort normal and breath sounds normal. No respiratory distress.  30% FiO2, breathing comfortably on pressure support  Abdominal: Soft. He exhibits distension. Bowel sounds are absent. There is no tenderness.  Musculoskeletal: He exhibits edema (3+ thighs/hips ).  Lymphadenopathy:    He has no cervical adenopathy.  Neurological: He is alert.  Skin: Skin is warm and dry. No rash noted.  Vitals reviewed. R IJ HD Cath 11/21 >> clean and dry. CRRT ongoing.  L IJ Central Line 11/29 >> clean and dry. Insertion site WNL L Radial ALine 11/22 >> site with some blood. Otherwise clean  Lab Results Lab Results  Component Value Date   WBC 61.7 (HH) 11/11/2018   HGB 10.2 (L) 11/11/2018   HCT 30.0 (L) 11/11/2018   MCV 84.6 11/11/2018   PLT 170 11/11/2018    Lab Results  Component Value Date  CREATININE 1.80 (H) 11/11/2018   BUN 30 (H) 11/11/2018   NA 137 11/11/2018   K 3.2 (L) 11/11/2018   CL 85 (L) 11/11/2018   CO2 35 (H) 11/11/2018    Lab Results  Component Value Date   ALT 15 11/09/2018   AST 61 (H) 11/09/2018   ALKPHOS 175 (H) 11/09/2018   BILITOT 4.0 (H) 11/09/2018     Microbiology: Recent Results (from the past 240 hour(s))  C difficile quick scan w PCR reflex     Status: None   Collection Time: 11/07/18  9:35 AM  Result Value Ref Range Status   C Diff antigen NEGATIVE NEGATIVE Final   C Diff toxin NEGATIVE NEGATIVE Final   C Diff interpretation No C. difficile detected.  Final    Comment:  Performed at [facility], [place] [address], [place], [place] [address]  Culture, blood (routine x 2)     Status: None (Preliminary result)   Collection Time: 11/07/18  5:46 PM  Result Value Ref Range Status   Specimen Description BLOOD BLOOD RIGHT HAND  Final   Special Requests   Final    BOTTLES DRAWN AEROBIC ONLY Blood Culture results may not be optimal due to an inadequate volume of blood received in culture bottles   Culture   Final    NO GROWTH 4 DAYS Performed at [facility], [place] [address], [place], [place] [address]    Report Status PENDING  Incomplete  Culture, blood (routine x 2)     Status: None (Preliminary result)   Collection Time: 11/07/18  5:46 PM  Result Value Ref Range Status   Specimen Description BLOOD BLOOD LEFT HAND  Final   Special Requests   Final    BOTTLES DRAWN AEROBIC ONLY Blood Culture adequate volume   Culture   Final    NO GROWTH 4 DAYS Performed at [facility], [place] [address], [place], [place] [address]    Report Status PENDING  Incomplete    Janene Madeira, MSN, NP-C [facility] [place] Cell: [phone] Pager: [phone]  11/11/2018 11:01 AM

## 2018-11-11 NOTE — Progress Notes (Signed)
[facility] Progress Note    Assessment/ Plan:    1. Dialysis dependent AKI from ATN, anuric; off CRRT 11/27, back on 11/30.  Was on fixed dose heparin; now citrate. On 4K replacement with some mild hypokalemia- replete prn.  2. Septic shock secondary to severe necrotizing pancreatitis; on meropenem and eraxis. WBC and pressor requirement improving.  CT abd/ pelvis 12/2 with increased inflammation/ stranding, no pseudocyst formation.   Blood cultures 11/30 negative so far.  Will need cholecystectomy when acute pancreatitis resolves.  3. Acute hypoxic RF: extubated 11/26, reintubated 11/29, s/p trach 12/1 4. Hypocalcemia; stable on 2.5Ca bath; corrects 2/2 hypoalbuminemia-- watch closely on citrate 5. Anemia- transfuse prn 6. Nutrition- on TPN 7. Temp HD cath present since 11/22 8. Dispo: remains in ICU  Subjective:    WBC and pressor requirement coming down.  Pulling net even.         Objective:   BP (!) 144/68 (BP Location: Right Arm)   Pulse (!) 58   Temp (!) 97.3 F (36.3 C) (Oral)   Resp 18   Ht 6' (1.829 m)   Wt 91.7 kg   SpO2 99%   BMI 27.42 kg/m   Intake/Output Summary (Last 24 hours) at 11/11/2018 0902 Last data filed at 11/11/2018 0900 Gross per 24 hour  Intake 7069.8 ml  Output 6511 ml  Net 558.8 ml   Weight change: 1.2 kg  Physical Exam: Gen: ill-jaundiced, not responsive to stimuli HEENT + trach without drainage CVS: RRR Resp: rhonchrous bilaterally Abd: soft, distended and tender in epigastrum, not rigid  Ext:3+ diffuse anasarca  Imaging: Ct Abdomen Pelvis Wo Contrast  Result Date: 11/09/2018 CLINICAL DATA:  Abdominal pain, fever. EXAM: CT ABDOMEN AND PELVIS WITHOUT CONTRAST TECHNIQUE: Multidetector CT imaging of the abdomen and pelvis was performed following the standard protocol without IV contrast. COMPARISON:  CT scan of November 04, 2018. FINDINGS: Lower chest: Mild bilateral pleural effusions are noted with adjacent atelectasis or  infiltrates. Hepatobiliary: Minimal cholelithiasis is noted. The liver is unremarkable. No biliary dilatation is noted. Pancreas: Mildly increased inflammatory changes and fluid is noted around the pancreas consistent with acute pancreatitis. Due to the lack of intravenous contrast, pancreatic necrosis cannot be excluded. No definite pseudocyst formation is seen at this time. Spleen: Normal in size without focal abnormality. Adrenals/Urinary Tract: Adrenal glands are unremarkable. Kidneys are normal, without renal calculi, focal lesion, or hydronephrosis. Bladder is unremarkable. Stomach/Bowel: Nasogastric tube is seen in the stomach. There is no evidence of bowel obstruction. The appendix is not visualized. Wall thickening of duodenum is noted consistent with secondary inflammation. Vascular/Lymphatic: Aortic atherosclerosis. No enlarged abdominal or pelvic lymph nodes. Reproductive: Prostate is unremarkable. Other: Mild anasarca is noted. No definite hernia or ascites is noted. Musculoskeletal: No acute or significant osseous findings. IMPRESSION: Findings consistent with acute pancreatitis with increased amount of inflammatory stranding and fluid around the pancreas. No definite pseudocyst formation is seen at this time. Wall thickening of adjacent duodenum is noted consistent with secondary inflammation. Mild anasarca. Aortic Atherosclerosis (ICD10-I70.0). Electronically Signed   By: Marijo Conception, M.D.   On: 11/09/2018 20:49    Labs: BMET Recent Labs  Lab 11/08/18 1320 11/08/18 1641  11/09/18 0403  11/09/18 1600  11/10/18 0439  11/10/18 1454  11/10/18 2001 11/10/18 2006 11/10/18 2200 11/10/18 2206 11/11/18 0426 11/11/18 0428 11/11/18 0542 11/11/18 0547  NA 134* 136   < > 133*   < > 132*   < > 132*   < >  133*   < > 134* 137 134* 137 137  --  135 137  K 5.4* 5.2*   < > 4.6   < > 4.2   < > 3.8   < > 3.5   < > 3.7 3.4* 3.3* 3.1* 3.2*  --  3.4* 3.2*  CL 100 99   < > 94*   < > 89*   < > 88*    < > 88*   < > 87* 86* 86* 84* 86*  --  86* 85*  CO2 21* 25  --  26  --  25  --  27  --  29  --   --   --   --   --  35*  --   --   --   GLUCOSE 131* 83   < > 305*   < > 365*   < > 317*   < > 311*   < > 228* 291* 228* 286* 159*  --  173* 246*  BUN 66* 62*   < > 59*   < > 46*   < > 44*   < > 38*   < > 39* 30* 37* 29* 38*  --  36* 30*  CREATININE 4.25* 4.32*   < > 4.08*   < > 3.10*   < > 3.08*   < > 2.79*   < > 2.40* 1.90* 2.50* 1.90* 2.41*  --  2.30* 1.80*  CALCIUM 7.0* 7.2*  --  7.3*  --  8.3*  --  8.8*  --  9.5  --   --   --   --   --  10.3  --   --   --   PHOS 4.7* 4.8*  --  4.0  --  3.7  --  3.4  3.3  --  2.5  --   --   --   --   --  2.1* 2.2*  --   --    < > = values in this interval not displayed.   CBC Recent Labs  Lab 11/05/18 0443  11/08/18 1027  11/09/18 0403  11/10/18 0439  11/10/18 2206 11/11/18 0428 11/11/18 0542 11/11/18 0547  WBC 39.6*   < > 77.7*  --  68.9*  --  78.1*  --   --  61.7*  --   --   NEUTROABS 33.3*  --   --   --  46.8*  --   --   --   --   --   --   --   HGB 8.1*   < > 7.9*   < > 7.3*   < > 6.7*   < > 9.9* 7.9* 9.5* 10.2*  HCT 25.4*   < > 24.8*   < > 22.6*   < > 20.8*   < > 29.0* 23.7* 28.0* 30.0*  MCV 87.3   < > 87.6  --  86.9  --  86.0  --   --  84.6  --   --   PLT 129*   < > 170  --  165  --  183  --   --  170  --   --    < > = values in this interval not displayed.    Medications:    . chlorhexidine gluconate (MEDLINE KIT)  15 mL Mouth Rinse BID  . heparin injection (subcutaneous)  5,000 Units Subcutaneous Q8H  . hydrocortisone sodium succinate  50 mg Intravenous  Q6H  . mouth rinse  15 mL Mouth Rinse 10 times per day  . pantoprazole (PROTONIX) IV  40 mg Intravenous Daily  . sodium chloride flush  10-40 mL Intracatheter Q12H      Madelon Lips, MD 11/11/2018, 9:02 AM

## 2018-11-12 ENCOUNTER — Inpatient Hospital Stay (HOSPITAL_COMMUNITY): Payer: Medicare Other

## 2018-11-12 DIAGNOSIS — K567 Ileus, unspecified: Secondary | ICD-10-CM

## 2018-11-12 DIAGNOSIS — R9431 Abnormal electrocardiogram [ECG] [EKG]: Secondary | ICD-10-CM

## 2018-11-12 DIAGNOSIS — Z978 Presence of other specified devices: Secondary | ICD-10-CM

## 2018-11-12 DIAGNOSIS — R740 Nonspecific elevation of levels of transaminase and lactic acid dehydrogenase [LDH]: Secondary | ICD-10-CM

## 2018-11-12 DIAGNOSIS — R739 Hyperglycemia, unspecified: Secondary | ICD-10-CM

## 2018-11-12 DIAGNOSIS — R001 Bradycardia, unspecified: Secondary | ICD-10-CM

## 2018-11-12 LAB — GLUCOSE, CAPILLARY
GLUCOSE-CAPILLARY: 130 mg/dL — AB (ref 70–99)
GLUCOSE-CAPILLARY: 123 mg/dL — AB (ref 70–99)
GLUCOSE-CAPILLARY: 167 mg/dL — AB (ref 70–99)
GLUCOSE-CAPILLARY: 169 mg/dL — AB (ref 70–99)
GLUCOSE-CAPILLARY: 150 mg/dL — AB (ref 70–99)
GLUCOSE-CAPILLARY: 153 mg/dL — AB (ref 70–99)
GLUCOSE-CAPILLARY: 136 mg/dL — AB (ref 70–99)
Glucose-Capillary: 141 mg/dL — ABNORMAL HIGH (ref 70–99)
Glucose-Capillary: 152 mg/dL — ABNORMAL HIGH (ref 70–99)
Glucose-Capillary: 152 mg/dL — ABNORMAL HIGH (ref 70–99)
Glucose-Capillary: 154 mg/dL — ABNORMAL HIGH (ref 70–99)
Glucose-Capillary: 163 mg/dL — ABNORMAL HIGH (ref 70–99)
Glucose-Capillary: 164 mg/dL — ABNORMAL HIGH (ref 70–99)
Glucose-Capillary: 166 mg/dL — ABNORMAL HIGH (ref 70–99)
Glucose-Capillary: 167 mg/dL — ABNORMAL HIGH (ref 70–99)
Glucose-Capillary: 95 mg/dL (ref 70–99)
Glucose-Capillary: 148 mg/dL — ABNORMAL HIGH (ref 70–99)
Glucose-Capillary: 162 mg/dL — ABNORMAL HIGH (ref 70–99)
Glucose-Capillary: 165 mg/dL — ABNORMAL HIGH (ref 70–99)
Glucose-Capillary: 165 mg/dL — ABNORMAL HIGH (ref 70–99)
Glucose-Capillary: 176 mg/dL — ABNORMAL HIGH (ref 70–99)
Glucose-Capillary: 147 mg/dL — ABNORMAL HIGH (ref 70–99)
Glucose-Capillary: 168 mg/dL — ABNORMAL HIGH (ref 70–99)
Glucose-Capillary: 166 mg/dL — ABNORMAL HIGH (ref 70–99)
Glucose-Capillary: 143 mg/dL — ABNORMAL HIGH (ref 70–99)
Glucose-Capillary: 180 mg/dL — ABNORMAL HIGH (ref 70–99)

## 2018-11-12 LAB — POCT I-STAT, CHEM 8
BUN: 30 mg/dL — ABNORMAL HIGH (ref 8–23)
Calcium, Ion: <0.3 mmol/L — CL (ref 1.15–1.40)
Chloride: 84 mmol/L — ABNORMAL LOW (ref 98–111)
Creatinine, Ser: 1.7 mg/dL — ABNORMAL HIGH (ref 0.61–1.24)
Glucose, Bld: 267 mg/dL — ABNORMAL HIGH (ref 70–99)
HEMATOCRIT: 33 % — AB (ref 39.0–52.0)
Hemoglobin: 11.2 g/dL — ABNORMAL LOW (ref 13.0–17.0)
POTASSIUM: 3.1 mmol/L — AB (ref 3.5–5.1)
Sodium: 138 mmol/L (ref 135–145)
TCO2: 36 mmol/L — ABNORMAL HIGH (ref 22–32)

## 2018-11-12 LAB — RENAL FUNCTION PANEL
Albumin: 1.1 g/dL — ABNORMAL LOW (ref 3.5–5.0)
Anion gap: 21 — ABNORMAL HIGH (ref 5–15)
BUN: 98 mg/dL — ABNORMAL HIGH (ref 8–23)
CO2: 27 mmol/L (ref 22–32)
Calcium: 9.3 mg/dL (ref 8.9–10.3)
Chloride: 86 mmol/L — ABNORMAL LOW (ref 98–111)
Creatinine, Ser: 4.37 mg/dL — ABNORMAL HIGH (ref 0.61–1.24)
GFR calc non Af Amer: 13 mL/min — ABNORMAL LOW (ref >60)
GFR, EST AFRICAN AMERICAN: 15 mL/min — AB (ref >60)
Glucose, Bld: 152 mg/dL — ABNORMAL HIGH (ref 70–99)
Phosphorus: 4.2 mg/dL (ref 2.5–4.6)
Potassium: 3.9 mmol/L (ref 3.5–5.1)
Sodium: 134 mmol/L — ABNORMAL LOW (ref 135–145)

## 2018-11-12 LAB — CALCIUM, IONIZED: Calcium, Ionized, Serum: 5.3 mg/dL (ref 4.5–5.6)

## 2018-11-12 LAB — COMPREHENSIVE METABOLIC PANEL
ALBUMIN: 1.2 g/dL — AB (ref 3.5–5.0)
ALT: 45 U/L — ABNORMAL HIGH (ref 0–44)
AST: 256 U/L — ABNORMAL HIGH (ref 15–41)
Alkaline Phosphatase: 129 U/L — ABNORMAL HIGH (ref 38–126)
Anion gap: 18 — ABNORMAL HIGH (ref 5–15)
BUN: 70 mg/dL — ABNORMAL HIGH (ref 8–23)
CO2: 31 mmol/L (ref 22–32)
CREATININE: 3.38 mg/dL — AB (ref 0.61–1.24)
Calcium: 9.9 mg/dL (ref 8.9–10.3)
Chloride: 86 mmol/L — ABNORMAL LOW (ref 98–111)
GFR calc Af Amer: 21 mL/min — ABNORMAL LOW (ref >60)
GFR calc non Af Amer: 18 mL/min — ABNORMAL LOW (ref >60)
Glucose, Bld: 168 mg/dL — ABNORMAL HIGH (ref 70–99)
Potassium: 3.5 mmol/L (ref 3.5–5.1)
Sodium: 135 mmol/L (ref 135–145)
Total Bilirubin: 4.4 mg/dL — ABNORMAL HIGH (ref 0.3–1.2)
Total Protein: 6.8 g/dL (ref 6.5–8.1)

## 2018-11-12 LAB — CULTURE, BLOOD (ROUTINE X 2)
Culture: NO GROWTH
Culture: NO GROWTH
Special Requests: ADEQUATE

## 2018-11-12 LAB — CBC
HCT: 22.8 % — ABNORMAL LOW (ref 39.0–52.0)
Hemoglobin: 7.6 g/dL — ABNORMAL LOW (ref 13.0–17.0)
MCH: 28.4 pg (ref 26.0–34.0)
MCHC: 33.3 g/dL (ref 30.0–36.0)
MCV: 85.1 fL (ref 80.0–100.0)
Platelets: 182 10*3/uL (ref 150–400)
RBC: 2.68 MIL/uL — ABNORMAL LOW (ref 4.22–5.81)
RDW: 15.9 % — ABNORMAL HIGH (ref 11.5–15.5)
WBC: 54.4 10*3/uL (ref 4.0–10.5)
nRBC: 1.6 % — ABNORMAL HIGH (ref 0.0–0.2)

## 2018-11-12 LAB — PHOSPHORUS: Phosphorus: 3.3 mg/dL (ref 2.5–4.6)

## 2018-11-12 LAB — TROPONIN I
Troponin I: <0.03 ng/mL (ref <0.03)
Troponin I: <0.03 ng/mL (ref <0.03)
Troponin I: <0.03 ng/mL (ref <0.03)

## 2018-11-12 LAB — MAGNESIUM: Magnesium: 1.9 mg/dL (ref 1.7–2.4)

## 2018-11-12 MED ORDER — HYDROMORPHONE HCL 1 MG/ML PO LIQD: 4.0000 mg | ORAL | Status: DC

## 2018-11-12 MED ORDER — HYDROMORPHONE HCL 1 MG/ML PO LIQD: 2.0000 mg | ORAL | Status: DC

## 2018-11-12 MED ORDER — HYDROMORPHONE HCL 1 MG/ML PO LIQD: 2.0000 mg | ORAL | Status: DC | PRN

## 2018-11-12 MED ORDER — INSULIN DETEMIR 100 UNIT/ML ~~LOC~~ SOLN
25.0000 [IU] | Freq: Two times a day (BID) | SUBCUTANEOUS | Status: DC
  Administered 2018-11-12 – 2018-11-15 (×6): 25 [IU] via SUBCUTANEOUS
  Filled 2018-11-12 (×9): qty 0.25

## 2018-11-12 MED ORDER — HYDROMORPHONE HCL 2 MG PO TABS
2.0000 mg | ORAL_TABLET | ORAL | Status: DC
  Administered 2018-11-12 – 2018-11-14 (×11): 2 mg
  Filled 2018-11-12 (×11): qty 1

## 2018-11-12 MED ORDER — TRACE MINERALS CR-CU-MN-SE-ZN 10-1000-500-60 MCG/ML IV SOLN
INTRAVENOUS | Status: DC
  Filled 2018-11-12: qty 416

## 2018-11-12 MED ORDER — HYDROMORPHONE HCL 2 MG PO TABS
2.0000 mg | ORAL_TABLET | ORAL | Status: DC | PRN
  Administered 2018-11-14: 2 mg
  Filled 2018-11-12 (×2): qty 1

## 2018-11-12 MED ORDER — IOHEXOL 300 MG/ML  SOLN
100.0000 mL | Freq: Once | INTRAMUSCULAR | Status: AC | PRN
  Administered 2018-11-12: 100 mL via INTRAVENOUS

## 2018-11-12 MED ORDER — INSULIN ASPART 100 UNIT/ML ~~LOC~~ SOLN
10.0000 [IU] | SUBCUTANEOUS | Status: DC
  Administered 2018-11-13 (×5): 10 [IU] via SUBCUTANEOUS

## 2018-11-12 MED ORDER — INSULIN ASPART 100 UNIT/ML ~~LOC~~ SOLN
3.0000 [IU] | SUBCUTANEOUS | Status: DC
  Administered 2018-11-12: 6 [IU] via SUBCUTANEOUS
  Administered 2018-11-13 (×2): 3 [IU] via SUBCUTANEOUS
  Administered 2018-11-13: 9 [IU] via SUBCUTANEOUS
  Administered 2018-11-13: 6 [IU] via SUBCUTANEOUS
  Administered 2018-11-13 – 2018-11-14 (×4): 3 [IU] via SUBCUTANEOUS
  Administered 2018-11-15: 9 [IU] via SUBCUTANEOUS
  Administered 2018-11-15: 3 [IU] via SUBCUTANEOUS

## 2018-11-12 MED ORDER — HYDROCORTISONE NA SUCCINATE PF 100 MG IJ SOLR
25.0000 mg | Freq: Every day | INTRAMUSCULAR | Status: DC
  Administered 2018-11-13 – 2018-11-14 (×2): 25 mg via INTRAVENOUS
  Filled 2018-11-12 (×2): qty 2

## 2018-11-12 MED ORDER — DEXTROSE 10 % IV SOLN
INTRAVENOUS | Status: DC | PRN
  Administered 2018-11-14 – 2018-11-16 (×4): via INTRAVENOUS

## 2018-11-12 NOTE — Progress Notes (Signed)
NAME:  Derek Riley, MRN:  [id number], DOB:  1951-09-01, LOS: 48 ADMISSION DATE:  10/27/2018, CONSULTATION DATE:  11/22 REFERRING MD:  11/22, CHIEF COMPLAINT:  Worsening metabolic acidosis and renal failure w/ clinical decline  Brief History   67 year old male admitted 11/20 w/ gallstone pancreatitis. PCCM consulted 11/22 for progressive metabolic acidosis, acute renal failure and persistent clinical decline.   Past Medical History  Diabetes, gallstones, hypertension.  Significant Hospital Events   11/20 transferred to [facility] from [place] w/ acute gallstone pancreatitis. GI & surgicals services consulted. Started On supportive IVFs, empiric zosyn and analgesic support.  Surgical service is planning on cholecystectomy once pancreatitis improved.  Initial lipase on arrival to [place], creatinine 1.72 11/21: MRCP 11/21 showing extensive pancreatitis, possible hemorrhagic peripancreatic fluid no choledocholithiasis, lipase 3361, creatinine up to 4.91, anion gap 13.  No role for ERCP continued supportive care 11/22: Creatinine up to 6.95, gap now 16, progressive diffuse mottling, worse pain, critical care consulted for shock, and progressive multiorgan failure, moving to intensive care for CRRT as well as hemodynamic support. 3.7 liters + since admit, developed worsening resp failure requiring intubation shortly after arrival. Possible aspiration during intubation  11/26 started on meropenem (d/t rising CBC) CRRT stopped 11/27 extubated, pressors off.  11/28 look weaker. WIll probably need re-intubation. 11/29 reintubated for increasing WOB, likely aspiration even overnight.  11/30: Remains intubated.  White blood cell count now up to 62,000.  CRRT resumed 12/1: No significant change.  Ongoing cuff leak opting for early tracheostomy versus endotracheal tube change.  12/2: tolerating PS wean at 10/5, CT of the abdomen/pelvis ordered  Consults:  11/20 GI and surgical services 11/22  critical care  Procedures:  11/22 Foley catheter 11/22 right IJ CVL (HD)>>> 11/22 OETT 11/22>>>12/1 Trach 12/1 (JY)>>>  Significant Diagnostic Tests:  MRCP 11/21: Acute pancreatitis with extensive pancreatic and periPancreatic inflammatory changes, including proteinaceous and/or hemorrhagic peri-pancreatic fluid.  No choledocholithiasis or biliary tract obstruction.  Small volume of ascites, trace bilateral pleural effusions.  Bilateral basilar volume loss  Micro Data:  Cultures negative  Antimicrobials:  Zosyn 11/20>>>11/26 Meropenem 11/26>>> 11/11/2018 11/11/2018 all antibiotics stopped by ID  Interim history/subjective:  Requiring 400 of fentanyl at the moment.  Also on Precedex.  Still requiring low-dose norepinephrine.  No fevers overnight.  White count trending down.  Objective   Blood pressure 127/63, pulse 72, temperature 100.1 F (37.8 C), temperature source Axillary, resp. rate 17, height 6' (1.829 m), weight 92.1 kg, SpO2 95 %.    Vent Mode: PRVC FiO2 (%):  [30 %] 30 % Set Rate:  [18 bmp] 18 bmp Vt Set:  [620 mL] 620 mL PEEP:  [5 cmH20] 5 cmH20 Pressure Support:  [8 cmH20] 8 cmH20 Plateau Pressure:  [16 cmH20-24 cmH20] 24 cmH20   Intake/Output Summary (Last 24 hours) at 11/12/2018 1208 Last data filed at 11/12/2018 1100 Gross per 24 hour  Intake 3941.07 ml  Output 879 ml  Net 3062.07 ml   Filed Weights   11/10/18 0452 11/11/18 0500 11/12/18 0500  Weight: 90.5 kg 91.7 kg 92.1 kg   Examination: General appearance: 67 y.o., male, alert to voice, does look around the room Eyes: anicteric sclerae, PERRLA, tracking appropriately HENT: NCAT; oropharynx, MMM Neck: Trachea midline; FROM, supple, lymphadenopathy, no JVD Lungs: CTAB, no crackles, no wheeze, lateral ventilated breath sounds, diminished in the bases CV: Tachycardic regular, S1, S2, no MRGs  Abdomen: Distended, tender to palpation, bowel sounds present but very quiet Extremities:  Significant dependent  third spaced edema in the bilateral upper extremities flanks and lower extremities Skin: Some weeping in the upper extremities Psych: No sign of active delirium Neuro: Move all 4 extremities with command, however weak  Resolved Hospital Problem list     Assessment & Plan:   Acute gallstone pancreatitis development of pancreatic necrosis no obvious abscess or cystic formation. -No evidence of choledocholelithiasis on MRCP Plan Post inflammatory response of severe necrotizing pancreatitis. He is significantly third spaced with interstitial edema Significant cumulative fluid balance Needs continued escalation of tube feeds Would recommend stopping TPN as soon as possible due to increased risk of bacteremia and fungemia as well as excess volume.  Abdominal pain secondary to above - We will need to balance continuous fentanyl with management of his ongoing pain. -I think we should transition to oral opiate, will start hydromorphone 2 mg every 4 hours with breakthrough via NG tube. -Will attempt to titrate down fentanyl  Ileus Plan Continue up titrating postpyloric feeding with tube feeds  Ongoing leukocytosis in setting of pancreatic inflammation Plan We will follow on CBC.  No obvious source of infection Suspect leukemoid-like reaction due to his severe pancreatic inflammation Also on high-dose hydrocortisone for several days We will de-escalate hydrocortisone  Acute hypoxic respiratory failure requiring mechanical ventilation in the setting of acute aspiration event 11/29, progressive atelectasis, and decreased abdominal compliance from pain and impaired cough status post tracheostomy currently requiring ventilatory support. Plan Would prefer stopping of all continuous sedation.  Severe deconditioning and protein calorie malnutrition Plan Continue TPN Will need LTAC pending hemodynamics and dialysis needs TPN to be determined by surgery as to when start TF  Acute kidney  injury with baseline creatinine 1.  This was hemodynamically mediated plus/minus contrast-induced nephropathy Plan Has been on line holiday due to removal of CRRT catheter We will need to plan placement of repeat Vas-Cath.  Ongoing hypotension, I believe this is drug related Plan We need to get him off continuous sedation  Hyperglycemia Plan We will get transition off of the insulin drip to subcu injections  Elevated cumulative fluid balance and electrolyte imbalance: Hyperkalemia Space with significant edema Plan We will follow his electrolytes daily We will need fluid removal with CRRT  This patient is critically ill with multiple organ system failure; which, requires frequent high complexity decision making, assessment, support, evaluation, and titration of therapies. This was completed through the application of advanced monitoring technologies and extensive interpretation of multiple databases. During this encounter critical care time was devoted to patient care services described in this note for 42 minutes.   Garner Nash, DO [facility] 11/12/2018 12:08 PM  Personal pager: [phone] If unanswered, please page CCM On-call: [phone]

## 2018-11-12 NOTE — Progress Notes (Signed)
PT Cancellation Note  Patient Details Name: Derek Riley MRN: [id number] DOB: 1951/07/13   Cancelled Treatment:     Discussed with RN, patient to restart CRRT, sedated, not medically ready for physical therapy and asks we hold. Will cont to follow and resume when appropriate.   Reinaldo Berber, PT, DPT Acute Rehabilitation Services Pager: [phone] Office: [phone]      Reinaldo Berber 11/12/2018, 9:03 AM

## 2018-11-12 NOTE — Progress Notes (Signed)
Pt unable to take PO contrast for Abd CT, received verbal okay to give IV contrast if needed from Dr. Hollie Salk with Nephrology.

## 2018-11-12 NOTE — Progress Notes (Signed)
RT note: attempted daily SBT on patient this AM on CPAP/PSV of 12/5 at 0750.  Patient looked comfortable however after 5 minutes patient's systolic blood pressure increased to 180 (was 130s prior to trial).  Patient placed back on full support ventilation and systolic blood pressure decreased back to 140s upon leaving patient's room.  Will continue to monitor.

## 2018-11-12 NOTE — Progress Notes (Addendum)
PHARMACY - ADULT TOTAL PARENTERAL NUTRITION CONSULT NOTE   Pharmacy Consult:  TPN Indication: Necrotizing pancreatitis  Patient Measurements: Height: 6' (182.9 cm) Weight: 203 lb 0.7 oz (92.1 kg) IBW/kg (Calculated) : 77.6 TPN AdjBW (KG): 87.4 Body mass index is 27.54 kg/m. Usual Weight: 88 kg  Assessment:  28 YOM with PMH of DM, gallstones, and HTN. Originally admitted to [facility] on 11/20 after transfer d/t acute gallstone pancreatitis. ERCP on 10/29/18 showed extensive pancreatitis and possible hemorrhagic peripancreatic fluid. Patient was moved to ICU on 11/22 and received CRRT unitl 11/27. CT abdomen 11/26 showed necrotizing pancreatitis.  Patient decompensated 11/06/18 requiring reintubation and restart of pressors; CRRT restarted 11/07/18.  GI: Pending cholecystectomy when pancreatitis has resolved. Baseline prealbumin < 5 remains <5. Albumin down to 1.2. BM 12/2. Cortrak placed on 12/4 and TFs started. PPI IV, PRN Zofran Endo: DM on metformin PTA. CBGs better controlled (90-170s) on insulin gtt and TPN in bag. Is on several infusions containing dextrose outside of TPN as well. Insulin requirements in the past 24 hours: Now on insulin gtt along with 80 units in TPN (on steroids) Lytes: wnl today. K borderline low at 3.5 but up from yesterday. Cl low at 86, Bicarb high at 31. CoCa elevated at 12.2, but ionized Ca low at < 0.3. Calcium Gluconate infusion with CRRT Renal: CRRT paused for line holiday. Has been off and on CRRT since 11/30. Severe AKI from ATN, s/p 5d CRRT >> iHD but didn't tolerate on 11/29 d/t hypotension. No UOP. Severe metabolic acidosis. BUN down to 30. Pulm: intubated 11/22>>11/26, reintubated 11/29. Trach placed 12/1. Cards: Continues on vasopressin, Norepi inf Hepatobil: AST/ALT/Alk phos trending up, tbili elevated 4.4 (jaundice per RN), TG down to 138. Neuro:Sedated on the vent. GCS 13, CPOT 0, RASS -1 (goal -1) Precedex, Fentanyl ID: Merrem/Eraxis for necrotizing  pancreatitis, cover for aspiration, s/p Zosyn - Afebrile over last 24 hrs. WBC elevated but down to 54.4. TPN Access: HD cath pigtail + CVC triple lumen placed 12/06/18 TPN start date: 11/05/18  Nutritional Goals (per RD rec on 12/4): Kcal: 2125 Protein: 140-180 g Fluid:  2.1-2.3 L  Current Nutrition:  Vital AF 1.2 '@30ml'$ /hr Prostat BID TPN  Plan:  Decrease TPN to 69m/hr This TPN provides 62 g of protein, 168 g of dextrose, and 29g of lipids for a total of 1,114 kcals meeting ~40-50% of patient needs. Rest of needs being met by TFs and Prostat. Continue Vital AF 1.2 at 347mhr (Provides 54 g of protein and 864 kcal) Continue Prostat BID (Provides 30g of protein and 200 kcal per day) Electrolytes in TPN: Ca at 1065mL, No Mg, Low concentrations of Na, K, and Phos; Max Cl Add MVI, 1/2 trace elements (jaundice), and 50 units of regular insulin to TPN Continue insulin gtt per MD. No SSI currently Monitor TPN labs F/U tolerance of TFs and ability to wean TPN off   NatElenor QuinonesharmD, BCPS, BCIFlorida Surgery Center Enterprises LLCinical Pharmacist Phone number #[phone]/04/2018 7:36 AM

## 2018-11-12 NOTE — Progress Notes (Signed)
SLP Cancellation Note  Patient Details Name: Derek Riley MRN: [id number] DOB: [birth year]   Cancelled treatment:     Attempted to see pt for swallowing and PMV evaluation. Pt failed SBT this morning and is currently on vent.  Pt not medically appropriate for assessment at this time.    [place] 11/12/2018, 10:36 AM

## 2018-11-12 NOTE — Progress Notes (Addendum)
       [facility]  Date of Admission:  11/03/2018    LOS: 15   Total days of antibiotics 14 (stopped 12/5)       Day 4 anidulafungin   Day 9 meropenem   Day 5 Piperacillin-tazobactam          Patient ID: Derek Riley is a 67 y.o. M with  Active Problems:   Acute gallstone pancreatitis   HTN (hypertension)   AKI (acute kidney injury) (HCC)   Hyperglycemia   Hyperlipidemia   Aspiration pneumonia (HCC)   Acute hypoxemic respiratory failure (HCC)   Status post tracheostomy (HCC)   Aspiration of foreign body   Ileus (HCC)   . chlorhexidine gluconate (MEDLINE KIT)  15 mL Mouth Rinse BID  . feeding supplement (PRO-STAT SUGAR FREE 64)  30 mL Per Tube BID  . heparin injection (subcutaneous)  5,000 Units Subcutaneous Q8H  . hydrocortisone sodium succinate  50 mg Intravenous Q6H  . mouth rinse  15 mL Mouth Rinse 10 times per day  . pantoprazole (PROTONIX) IV  40 mg Intravenous Daily  . sodium chloride flush  10-40 mL Intracatheter Q12H    SUBJECTIVE: Remains on sedation. Trach in place. Nods to questions.   Interval changes over night with telemetry changes - troponin negative; echo pending  No Known Allergies  OBJECTIVE: Vitals:   11/12/18 0700 11/12/18 0737 11/12/18 0749 11/12/18 0800  BP: 121/60  (!) 155/62 (!) 126/59  Pulse: 66  63 66  Resp: 18  16 18  Temp:  100.1 F (37.8 C)    TempSrc:  Axillary    SpO2: 93%  100% 94%  Weight:      Height:       Body mass index is 27.54 kg/m.  Physical Exam  Constitutional: He is oriented to person, place, and time. He appears well-developed.  Opens eyes to voice and acknowledges me.   HENT:  Mouth/Throat: Mucous membranes are normal. Normal dentition. No dental abscesses.  Neck:  Trach in place; clean and w/o discharge  Cardiovascular: Normal rate and regular rhythm. Exam reveals no friction rub.  ST segment elevation noted on tele  Pulmonary/Chest: Effort normal.  Diffuse coarse  breath sounds bilaterally. FiO2 30%, breathing comfortably on full vent support   Abdominal: Soft. He exhibits distension. There is tenderness.  Musculoskeletal: He exhibits edema. Tenderness: 2-3 + edema   Neurological: He is alert and oriented to person, place, and time.  Skin: Skin is warm and dry. No rash noted.  Vitals reviewed. L IJ central line - clean and dry  L radial a line - clean and dry.   Lab Results Lab Results  Component Value Date   WBC 54.4 (HH) 11/12/2018   HGB 7.6 (L) 11/12/2018   HCT 22.8 (L) 11/12/2018   MCV 85.1 11/12/2018   PLT 182 11/12/2018    Lab Results  Component Value Date   CREATININE 3.38 (H) 11/12/2018   BUN 70 (H) 11/12/2018   NA 135 11/12/2018   K 3.5 11/12/2018   CL 86 (L) 11/12/2018   CO2 31 11/12/2018    Lab Results  Component Value Date   ALT 45 (H) 11/12/2018   AST 256 (H) 11/12/2018   ALKPHOS 129 (H) 11/12/2018   BILITOT 4.4 (H) 11/12/2018     Microbiology: Recent Results (from the past 240 hour(s))  C difficile quick scan w PCR reflex     Status: None   Collection Time:   11/07/18  9:35 AM  Result Value Ref Range Status   C Diff antigen NEGATIVE NEGATIVE Final   C Diff toxin NEGATIVE NEGATIVE Final   C Diff interpretation No C. difficile detected.  Final    Comment: Performed at [facility], [place] [address] [place], [place] [address]  Culture, blood (routine x 2)     Status: None (Preliminary result)   Collection Time: 11/07/18  5:46 PM  Result Value Ref Range Status   Specimen Description BLOOD BLOOD RIGHT HAND  Final   Special Requests   Final    BOTTLES DRAWN AEROBIC ONLY Blood Culture results may not be optimal due to an inadequate volume of blood received in culture bottles   Culture   Final    NO GROWTH 4 DAYS Performed at [facility], [place] [address] [place], [place] [address]    Report Status PENDING  Incomplete  Culture, blood (routine x 2)     Status: None (Preliminary result)   Collection Time:  11/07/18  5:46 PM  Result Value Ref Range Status   Specimen Description BLOOD BLOOD LEFT HAND  Final   Special Requests   Final    BOTTLES DRAWN AEROBIC ONLY Blood Culture adequate volume   Culture   Final    NO GROWTH 4 DAYS Performed at [facility], [place] [address], [place], [place] [address]    Report Status PENDING  Incomplete     ASSESSMENT & PLAN:   1. Severe Pancreatitis (?gallstone pancreatitis) = unlikely will be able to aspirate fluid d/t concern for fistula following procedure. Will repeat CT abd/pelvis to follow #1 and #2. D/W Dr. Hollie Salk and requested w/o contrast - will order as such to compare with previous non-contrast study. Enteral nutrition has been ordered via postpyloric tube but not yet started. TPN ongoing via L IJ.   2. Transaminitis = AST continues to increase (61 >> 168 >> 256). Tbili remains ~4. Would percutaneous cholecystostomy be helpful while we support him through illness until lap-chole can be done?   3. Fevers/Leukocytosis = wbc trending down; temperature 100.1 F this AM but otherwise had been afebrile. He has many sources of potential infection that could be contributing but could be related to intraabdominal inflammation in the setting of #1. Follow micro data. Currently on line holiday from HD catheter (12-03 removed). Levophed down to 8 mcg. Vasopressin still infusing.   4. Acute Renal Failure = Has been on CRRT per nephrology; currently on holiday with line removal. Planning to start back today with new line insertion.   5. ST Segment Changes = new problem. Possible pericarditis; echo pending. Cardiology consult pending.   Janene Madeira, MSN, NP-C [facility] [place] Cell: [phone] Pager: [phone]  11/12/2018  8:44 AM

## 2018-11-12 NOTE — Progress Notes (Signed)
Nutrition Follow-up  DOCUMENTATION CODES:   Not applicable  INTERVENTION:    Continue to advance TF rate by 10 ml every 4 hours to goal rate of 65 ml/h  Continue Pro-stat 30 ml BID  At goal, TF regimen will provide 2072 kcal, 147 gm protein, 1265 ml free water daily  Pharmacy to wean TPN as appropriate   NUTRITION DIAGNOSIS:   Inadequate oral intake related to inability to eat as evidenced by NPO status.  Ongoing   GOAL:   Patient will meet greater than or equal to 90% of their needs  Met with TPN + TF  MONITOR:   Diet advancement, Vent status, Labs, Weight trends, I & O's, TF tolerance(GOC)  ASSESSMENT:   67 y/o male PMHx htn/hld, dm2. Presented w/ abd pain that began suddenly 1 day PTA. Supportive care from PCP ineffective and presented to ED w/ vomiting. Determined to have severe gallstone pancreatitis w/ related AKI. Decompensated after arrival, developing  worsening renal failure. On 11/22 transfered to ICU and required intubation. Begun on pressors and CRRT.     CRRT is off for line holiday.   Cortrak placed yesterday with tip at the LOT. TF was initiated via Cortrak tube; currently receiving Vital AF 1.2 at 30 ml/h with Pro-stat 30 ml BID; tolerating well. Increasing by 10 ml every 4 hours to goal rate of 65 ml/h.    TPN at 60 ml/h, decreasing to 40 ml/h today to provide 1114 kcal, 62 gm protein  Patient remains intubated on ventilator support MV: 13.4 L/min Temp (24hrs), Avg:98.5 F (36.9 C), Min:97.9 F (36.6 C), Max:100.1 F (37.8 C)   Labs reviewed. CBG's: [id number] Medications reviewed.   Diet Order:   Diet Order            Diet NPO time specified  Diet effective now              EDUCATION NEEDS:   No education needs have been identified at this time  Skin:  Skin Assessment: Reviewed RN Assessment  Last BM:  12/2 (type 6)  Height:   Ht Readings from Last 1 Encounters:  11/06/18 6' (1.829 m)    Weight:   Wt  Readings from Last 1 Encounters:  11/12/18 92.1 kg    Ideal Body Weight:  80.91 kg  BMI:  Body mass index is 27.54 kg/m.  Estimated Nutritional Needs:   Kcal:  2125  Protein:  140-180 gm  Fluid:  2.1-2.3 L    Molli Barrows, RD, LDN, CNSC Pager [phone] After Hours Pager [phone]

## 2018-11-12 NOTE — Progress Notes (Signed)
[facility] Progress Note    Assessment/ Plan:    1. Dialysis dependent AKI from ATN, anuric; off CRRT 11/27, back on 11/30.  Paused 12/3 for line holiday, discussed with PCCM.  2. Septic shock secondary to severe necrotizing pancreatitis; on meropenem and eraxis. WBC and pressor requirement improving.  CT abd/ pelvis 12/2 with increased inflammation/ stranding, no pseudocyst formation.   Blood cultures 11/30 negative so far.  Will need cholecystectomy when acute pancreatitis resolves. Line holiday- HD cath removed 12/3. 3. Acute hypoxic RF: extubated 11/26, reintubated 11/29, s/p trach 12/1 4. Hypocalcemia; stable on 2.5Ca bath; corrects 2/2 hypoalbuminemia 5. Anemia- transfuse prn 6. Nutrition- on TPN 7. Temp HD cath present since 11/22-- removed 12/3. 8. Possible pericarditis: per primary 9. Dispo: remains in ICU  Subjective:    D/w PCCM- line holiday in progress.  CRRT paused.  ST segment changes overnight suggestive of pericarditis.  Possible cardiology consultation today.    Objective:   BP (!) 155/62 Comment: aline pressure  Pulse 63   Temp 100.1 F (37.8 C) (Axillary)   Resp 16   Ht 6' (1.829 m)   Wt 92.1 kg   SpO2 100%   BMI 27.54 kg/m   Intake/Output Summary (Last 24 hours) at 11/12/2018 0817 Last data filed at 11/12/2018 0600 Gross per 24 hour  Intake 4240.09 ml  Output 2220 ml  Net 2020.09 ml   Weight change: 0.4 kg  Physical Exam: Gen: ill-jaundiced, not responsive to stimuli HEENT + trach without drainage CVS: RRR, no rub appreciated Resp: rhonchrous bilaterally Abd: soft, distended and tender in epigastrum, not rigid  Ext:3+ diffuse anasarca  Imaging: Dg Abd Portable 1v  Result Date: 11/11/2018 CLINICAL DATA:  Feeding tube placement EXAM: PORTABLE ABDOMEN - 1 VIEW COMPARISON:  CT abdomen dated 11/09/2018. Plain film abdomen dated 11/08/2018. FINDINGS: Feeding tube has advanced into the duodenum, tip likely at the third portion of the  duodenum just proximal to the ligament of Treitz. No distended bowel loops appreciated in the upper abdomen. No evidence of free intraperitoneal air IMPRESSION: Feeding tube tip at the third portion of the duodenum just proximal to the ligament of Treitz. Electronically Signed   By: Franki Cabot M.D.   On: 11/11/2018 12:19    Labs: BMET Recent Labs  Lab 11/09/18 0403  11/09/18 1600  11/10/18 0439  11/10/18 1454  11/10/18 2206 11/11/18 0426 11/11/18 0428 11/11/18 0542 11/11/18 0547 11/11/18 1405 11/11/18 1453 11/12/18 0525  NA 133*   < > 132*   < > 132*   < > 133*   < > 137 137  --  135 137 138 138 135  K 4.6   < > 4.2   < > 3.8   < > 3.5   < > 3.1* 3.2*  --  3.4* 3.2* 3.1* 3.3* 3.5  CL 94*   < > 89*   < > 88*   < > 88*   < > 84* 86*  --  86* 85* 84* 85* 86*  CO2 26  --  25  --  27  --  29  --   --  35*  --   --   --   --  33* 31  GLUCOSE 305*   < > 365*   < > 317*   < > 311*   < > 286* 159*  --  173* 246* 267* 166* 168*  BUN 59*   < > 46*   < > 44*   < >  38*   < > 29* 38*  --  36* 30* 30* 38* 70*  CREATININE 4.08*   < > 3.10*   < > 3.08*   < > 2.79*   < > 1.90* 2.41*  --  2.30* 1.80* 1.70* 2.24* 3.38*  CALCIUM 7.3*  --  8.3*  --  8.8*  --  9.5  --   --  10.3  --   --   --   --  10.7* 9.9  PHOS 4.0  --  3.7  --  3.4  3.3  --  2.5  --   --  2.1* 2.2*  --   --   --  2.2* 3.3   < > = values in this interval not displayed.   CBC Recent Labs  Lab 11/09/18 0403  11/10/18 0439  11/11/18 0428 11/11/18 0542 11/11/18 0547 11/11/18 1405 11/12/18 0525  WBC 68.9*  --  78.1*  --  61.7*  --   --   --  54.4*  NEUTROABS 46.8*  --   --   --   --   --   --   --   --   HGB 7.3*   < > 6.7*   < > 7.9* 9.5* 10.2* 11.2* 7.6*  HCT 22.6*   < > 20.8*   < > 23.7* 28.0* 30.0* 33.0* 22.8*  MCV 86.9  --  86.0  --  84.6  --   --   --  85.1  PLT 165  --  183  --  170  --   --   --  182   < > = values in this interval not displayed.    Medications:    . chlorhexidine gluconate (MEDLINE KIT)  15 mL  Mouth Rinse BID  . feeding supplement (PRO-STAT SUGAR FREE 64)  30 mL Per Tube BID  . heparin injection (subcutaneous)  5,000 Units Subcutaneous Q8H  . hydrocortisone sodium succinate  50 mg Intravenous Q6H  . mouth rinse  15 mL Mouth Rinse 10 times per day  . pantoprazole (PROTONIX) IV  40 mg Intravenous Daily  . sodium chloride flush  10-40 mL Intracatheter Q12H      Madelon Lips, MD 11/12/2018, 8:17 AM

## 2018-11-12 NOTE — Consult Note (Addendum)
Cardiology Consultation:   Patient ID: Derek Riley MRN: [id number]; DOB: 17-Jul-1951  Admit date: 10/16/2018 Date of Consult: 11/12/2018  Primary Care Provider: Cher Nakai, MD Primary Cardiologist: New (Dr. Margaretann Loveless) Primary Electrophysiologist:  None    Patient Profile:   Derek Riley is a 67 y.o. critically ill male, hospital day #15, admitted with gallstone pancreatitis w/ progressive decline/ progression to severe necrotizing pancreatisis and hospital course complicated by shock, metabolic acidosis, multiorgan failure including respiratory failure requiring intubation and acute renal failure requiring CRRT,  who is being seen today for the evaluation of bradycardia and abnormal EKG, at the request of Dr. Chase Caller, Critical Care Medicine.   History of Present Illness:   Derek Riley has no prior cardiac history. He has multiple cardiac risk factors including DM, HTN and HLD. He initially presented to [facility] 2 weeks ago with abdominal pain and multiple episodes of emesis and found to have markedly elevated lipase at 50,341 and elevated LFTs ALT 305, AST 593, total bilirubin 3.2. CT of abdomen there showed severe edema of the pancreas consistent with pancreatitis. He was started on IVFs, IV antibiotics, analgesics and transferred to [facility] for further management on 10/14/2018. MRCP on 11/21 showed no choledocholithiasis. 11/22, he developed shock. Progression to severe necrotizing pancreatitis.  SCr up to 6.95. Pt transferred to intensive care unit. CRRT initiated. Also developed respiratory failure, requiring intubation. Now s/p tracheostomy.   This is hospital day #15. Cardiology consulted for bradycardia and EKG changes. This occurred ~12:30 PM. RN note states "Pt HR dropped from the 80s-40s with marked ST elevation. Precedex gtt turned off, fentanyl gtt decreased to 123mg. 12 Lead EKG obtained. Suctioned airway, no secretions noted. HR increased to 70's. Other VSS".   EKGs have  been reviewed. He has 1 EKG for baseline comparison. EKG obtained 12/4 showed diffuse ST elevations c/w acute pericarditis. EKGs today did show more pronounced ST elevations compared to yesterday. Troponin checked at 12:35 today was negative.  2D Echo has been ordered and pending. Telemetry shows intermittent junctional bradycardia. Currently NSR w/ HR in the 80s.    Past Medical History:  Diagnosis Date  . Diabetes mellitus without complication ([place]   . Gallstone 10/2018  . Hyperlipidemia   . Hypertension     Past Surgical History:  Procedure Laterality Date  . CIRCUMCISION    . excision of moles     face & leg  . TONSILLECTOMY       Home Medications:  Prior to Admission medications   Medication Sig Start Date End Date Taking? Authorizing Provider  aspirin EC 81 MG tablet Take 81 mg by mouth daily.   Yes [provider]  atorvastatin (LIPITOR) 80 MG tablet Take 80 mg by mouth every evening. 10/19/18  Yes [provider]  dicyclomine (BENTYL) 10 MG capsule Take 10 mg by mouth 3 (three) times daily as needed. unk 10/27/18  Yes [provider]  ezetimibe (ZETIA) 10 MG tablet Take 10 mg by mouth every evening. 08/13/18  Yes [provider]  finasteride (PROSCAR) 5 MG tablet Take 5 mg by mouth daily. 10/23/18  Yes [provider]  GLUCOSAMINE SULFATE PO Take 1 capsule by mouth 2 (two) times daily.   Yes [provider]  HYDROcodone-acetaminophen (NORCO/VICODIN) 5-325 MG tablet Take 1 tablet by mouth every 6 (six) hours as needed for pain. 09/15/18  Yes [provider]  lisinopril (PRINIVIL,ZESTRIL) 10 MG tablet Take 10 mg by mouth daily. 10/12/18  Yes [provider]  metFORMIN (GLUCOPHAGE) 1000 MG tablet Take 1,000 mg by mouth 2 (two) times daily. 10/15/18  Yes [provider]  Omega-3 1000 MG CAPS Take 1,000 mg by mouth 2 (two) times daily.   Yes [provider]  promethazine (PHENERGAN) 25 MG tablet  Take 25 mg by mouth every 6 (six) hours as needed for nausea/vomiting. 10/27/18  Yes [provider]    Inpatient Medications: Scheduled Meds: . chlorhexidine gluconate (MEDLINE KIT)  15 mL Mouth Rinse BID  . feeding supplement (PRO-STAT SUGAR FREE 64)  30 mL Per Tube BID  . heparin injection (subcutaneous)  5,000 Units Subcutaneous Q8H  . [START ON 11/13/2018] hydrocortisone sodium succinate  25 mg Intravenous Daily  . HYDROmorphone  2 mg Per Tube Q4H  . insulin aspart  10 Units Subcutaneous Q4H  . insulin aspart  3-9 Units Subcutaneous Q4H  . insulin detemir  25 Units Subcutaneous Q12H  . mouth rinse  15 mL Mouth Rinse 10 times per day  . pantoprazole (PROTONIX) IV  40 mg Intravenous Daily  . sodium chloride flush  10-40 mL Intracatheter Q12H   Continuous Infusions: .  prismasol BGK 4/2.5 800 mL/hr at 11/08/18 0910  .  prismasol BGK 4/2.5 300 mL/hr at 11/10/18 1723  . sodium chloride    . calcium gluconate infusion for CRRT Stopped (11/11/18 1515)  . dexmedetomidine (PRECEDEX) IV infusion Stopped (11/12/18 1222)  . dextrose    . feeding supplement (VITAL AF 1.2 CAL) 40 mL/hr at 11/12/18 1425  . fentaNYL infusion INTRAVENOUS 100 mcg/hr (11/12/18 1300)  . norepinephrine (LEVOPHED) Adult infusion 2 mcg/min (11/12/18 1300)  . prismasol B22GK 4/0 1,500 mL/hr at 11/11/18 1432  . sodium chloride 999 mL/hr at 11/08/18 2039  . sodium citrate 2 %/dextrose 2.5% solution 3000 mL 250 mL/hr at 11/11/18 0852  . TPN ADULT (ION) 60 mL/hr at 11/12/18 1300  . TPN ADULT (ION)     PRN Meds: Place/Maintain arterial line **AND** sodium chloride, acetaminophen, dextrose, fentaNYL, heparin, HYDROmorphone, midazolam, ondansetron (ZOFRAN) IV, sodium chloride, sodium chloride flush  Allergies:   No Known Allergies  Social History:   Social History   Socioeconomic History  . Marital status: Single    Spouse name: Not on file  . Number of children: Not on file  . Years of education: Not on  file  . Highest education level: Not on file  Occupational History  . Not on file  Social Needs  . Financial resource strain: Not on file  . Food insecurity:    Worry: Not on file    Inability: Not on file  . Transportation needs:    Medical: Not on file    Non-medical: Not on file  Tobacco Use  . Smoking status: Never Smoker  . Smokeless tobacco: Never Used  Substance and Sexual Activity  . Alcohol use: Never    Frequency: Never  . Drug use: Never  . Sexual activity: Not on file  Lifestyle  . Physical activity:    Days per week: Not on file    Minutes per session: Not on file  . Stress: Not on file  Relationships  . Social connections:    Talks on phone: Not on file    Gets together: Not on file    Attends religious service: Not on file    Active member of club or organization: Not on file    Attends meetings of clubs or organizations: Not on file    Relationship status: Not  on file  . Intimate partner violence:    Fear of current or ex partner: Not on file    Emotionally abused: Not on file    Physically abused: Not on file    Forced sexual activity: Not on file  Other Topics Concern  . Not on file  Social History Narrative  . Not on file    Family History:    Family History  Problem Relation Age of Onset  . Pancreatitis Neg Hx      ROS:  Please see the history of present illness.   All other ROS reviewed and negative.     Physical Exam/Data:   Vitals:   11/12/18 1000 11/12/18 1100 11/12/18 1200 11/12/18 1232  BP: 127/63 (!) 124/48 126/69 (!) 149/54  Pulse: 72 68 74 78  Resp: '17 18 18   '$ Temp:   100 F (37.8 C)   TempSrc:   Axillary   SpO2: 95% 94% 95% 95%  Weight:      Height:        Intake/Output Summary (Last 24 hours) at 11/12/2018 1452 Last data filed at 11/12/2018 1300 Gross per 24 hour  Intake 3837.77 ml  Output 478 ml  Net 3359.77 ml   Filed Weights   11/10/18 0452 11/11/18 0500 11/12/18 0500  Weight: 90.5 kg 91.7 kg 92.1 kg    Body mass index is 27.54 kg/m.  General:  Alert, responds to commands, has trach HEENT: normal Lymph: no adenopathy Neck: no JVD Endocrine:  No thryomegaly Vascular: No carotid bruits; FA pulses 2+ bilaterally without bruits  Cardiac:  normal S1, S2; RRR; no murmur  Lungs:  clear to auscultation bilaterally, no wheezing, rhonchi or rales  Abd: soft, nontender, no hepatomegaly  Ext: LEE edema present bilaterally Musculoskeletal:  No deformities, BUE and BLE strength normal and equal Skin: warm and dry  Neuro:  CNs 2-12 intact, no focal abnormalities noted Psych:  Normal affect   EKG:  The EKG was personally reviewed and demonstrates:  Diffuse ST elevations, more pronounced today compared to baseline EKG 11/11/18 Telemetry:  Telemetry was personally reviewed and demonstrates:  Currently NSR, but transient sinus bradycardia and junctional bradycardia w/ HR in the 30s.   Relevant CV Studies: 2D Echo pending.   Laboratory Data:  Chemistry Recent Labs  Lab 11/11/18 0426  11/11/18 1405 11/11/18 1453 11/12/18 0525  NA 137   < > 138 138 135  K 3.2*   < > 3.1* 3.3* 3.5  CL 86*   < > 84* 85* 86*  CO2 35*  --   --  33* 31  GLUCOSE 159*   < > 267* 166* 168*  BUN 38*   < > 30* 38* 70*  CREATININE 2.41*   < > 1.70* 2.24* 3.38*  CALCIUM 10.3  --   --  10.7* 9.9  GFRNONAA 27*  --   --  29* 18*  GFRAA 31*  --   --  34* 21*  ANIONGAP 16*  --   --  20* 18*   < > = values in this interval not displayed.    Recent Labs  Lab 11/09/18 0403  11/11/18 0426 11/11/18 1453 11/12/18 0525  PROT 6.3*  --   --  7.8 6.8  ALBUMIN 1.2*   < > 1.2* 1.3*  1.3* 1.2*  AST 61*  --   --  168* 256*  ALT 15  --   --  26 45*  ALKPHOS 175*  --   --  193* 129*  BILITOT 4.0*  --   --  4.5* 4.4*   < > = values in this interval not displayed.   Hematology Recent Labs  Lab 11/10/18 0439  11/11/18 0428  11/11/18 0547 11/11/18 1405 11/12/18 0525  WBC 78.1*  --  61.7*  --   --   --  54.4*  RBC 2.42*   --  2.80*  --   --   --  2.68*  HGB 6.7*   < > 7.9*   < > 10.2* 11.2* 7.6*  HCT 20.8*   < > 23.7*   < > 30.0* 33.0* 22.8*  MCV 86.0  --  84.6  --   --   --  85.1  MCH 27.7  --  28.2  --   --   --  28.4  MCHC 32.2  --  33.3  --   --   --  33.3  RDW 16.2*  --  15.4  --   --   --  15.9*  PLT 183  --  170  --   --   --  182   < > = values in this interval not displayed.   Cardiac Enzymes Recent Labs  Lab 11/11/18 2324 11/12/18 0526 11/12/18 1235  TROPONINI <0.03 <0.03 <0.03   No results for input(s): TROPIPOC in the last 168 hours.  BNPNo results for input(s): BNP, PROBNP in the last 168 hours.  DDimer No results for input(s): DDIMER in the last 168 hours.  Radiology/Studies:  Ct Abdomen Pelvis Wo Contrast  Result Date: 11/09/2018 CLINICAL DATA:  Abdominal pain, fever. EXAM: CT ABDOMEN AND PELVIS WITHOUT CONTRAST TECHNIQUE: Multidetector CT imaging of the abdomen and pelvis was performed following the standard protocol without IV contrast. COMPARISON:  CT scan of November 04, 2018. FINDINGS: Lower chest: Mild bilateral pleural effusions are noted with adjacent atelectasis or infiltrates. Hepatobiliary: Minimal cholelithiasis is noted. The liver is unremarkable. No biliary dilatation is noted. Pancreas: Mildly increased inflammatory changes and fluid is noted around the pancreas consistent with acute pancreatitis. Due to the lack of intravenous contrast, pancreatic necrosis cannot be excluded. No definite pseudocyst formation is seen at this time. Spleen: Normal in size without focal abnormality. Adrenals/Urinary Tract: Adrenal glands are unremarkable. Kidneys are normal, without renal calculi, focal lesion, or hydronephrosis. Bladder is unremarkable. Stomach/Bowel: Nasogastric tube is seen in the stomach. There is no evidence of bowel obstruction. The appendix is not visualized. Wall thickening of duodenum is noted consistent with secondary inflammation. Vascular/Lymphatic: Aortic  atherosclerosis. No enlarged abdominal or pelvic lymph nodes. Reproductive: Prostate is unremarkable. Other: Mild anasarca is noted. No definite hernia or ascites is noted. Musculoskeletal: No acute or significant osseous findings. IMPRESSION: Findings consistent with acute pancreatitis with increased amount of inflammatory stranding and fluid around the pancreas. No definite pseudocyst formation is seen at this time. Wall thickening of adjacent duodenum is noted consistent with secondary inflammation. Mild anasarca. Aortic Atherosclerosis (ICD10-I70.0). Electronically Signed   By: Marijo Conception, M.D.   On: 11/09/2018 20:49   Ct Abdomen W Contrast  Result Date: 11/12/2018 CLINICAL DATA:  Elevated liver function tests.  Known pancreatitis. EXAM: CT ABDOMEN AND PELVIS WITH CONTRAST TECHNIQUE: Multidetector CT imaging of the abdomen and pelvis was performed following the standard protocol following the bolus administration of intravenous contrast. CONTRAST:  198m OMNIPAQUE IOHEXOL 300 MG/ML  SOLN COMPARISON:  11/09/2018 FINDINGS: Lower chest: Small bilateral pleural effusions again noted with bilateral lower lobe collapse/consolidation. Small pericardial effusion  is stable. Hepatobiliary: No focal abnormality within the liver parenchyma. Gallbladder is decompressed with calcified stones visible in the lumen. No intrahepatic or extrahepatic biliary dilation. Pancreas: There is extensive peripancreatic edema/inflammation. The peripancreatic fluid appears more organized than on the prior study and is starting developed a thin enhancing rim in some areas. Short segment of the pancreatic body does not enhance on today's study raising the question of pancreatic necrosis. No dilatation of the main pancreatic duct. Spleen: No splenomegaly. No focal mass lesion. Adrenals/Urinary Tract: No adrenal nodule or mass. Kidneys are unremarkable. Stomach/Bowel: NG tube tip is in the gastric antrum. The feeding tube is looped in  the stomach before passing through the pylorus with distal tip positioned in the distal duodenum. No small bowel wall thickening. No small bowel dilatation. The terminal ileum is normal. The appendix is not visualized, but there is no edema or inflammation in the region of the cecum. No gross colonic mass. No colonic wall thickening. Vascular/Lymphatic: There is abdominal aortic atherosclerosis without aneurysm. Scattered small peripancreatic lymph nodes evident. No retroperitoneal lymphadenopathy. No pelvic sidewall lymphadenopathy. Reproductive: The prostate gland and seminal vesicles have normal imaging features. Other: Moderate amount of fluid is identified in the retroperitoneal space of the abdomen. Small volume intraperitoneal free fluid evident in the para colic gutters and pelvis. Musculoskeletal: Diffuse body wall edema evident. No worrisome lytic or sclerotic osseous abnormality. IMPRESSION: 1. Persistent peripancreatic edema/fluid. Fluid around the pancreas has become slightly more organized in the interval with a thin rim evident in some areas. 2. Short segment of pancreatic parenchyma in the body of pancreas does not enhance. Imaging features are suspicious for pancreatic necrosis. 3. No intra or extrahepatic biliary duct dilatation. 4. Cholelithiasis. 5. Small volume intraperitoneal free fluid. 6. Small bilateral pleural effusions with bilateral lower lobe collapse/consolidation. 7.  Aortic Atherosclerois (ICD10-170.0) Electronically Signed   By: Misty Stanley M.D.   On: 11/12/2018 14:40   Dg Chest Port 1 View  Result Date: 11/09/2018 CLINICAL DATA:  67 year old male with ileus.  Subsequent encounter. EXAM: PORTABLE CHEST 1 VIEW COMPARISON:  11/08/2018 chest x-ray. FINDINGS: Tracheostomy tube tip midline. Right central line tip proximal superior vena cava level. Left central line tip distal superior vena cava level. Persistent consolidation right lung base may represent infiltrate or atelectasis.  Less notable findings left lung base. Pulmonary vascular congestion appears stable. Heart size within normal limits.  Calcified aorta. Nasogastric tube courses below diaphragm. Tip not imaged on present exam. No pneumothorax detected. IMPRESSION: 1. Persistent consolidation right lung base may represent infiltrate or atelectasis. Less notable findings left lung base. 2. Pulmonary vascular congestion appears stable. 3. Aortic Atherosclerosis (ICD10-I70.0). 4. No change in position of central lines or tracheostomy. Electronically Signed   By: Genia Del M.D.   On: 11/09/2018 07:02   Dg Abd Portable 1v  Result Date: 11/11/2018 CLINICAL DATA:  Feeding tube placement EXAM: PORTABLE ABDOMEN - 1 VIEW COMPARISON:  CT abdomen dated 11/09/2018. Plain film abdomen dated 11/08/2018. FINDINGS: Feeding tube has advanced into the duodenum, tip likely at the third portion of the duodenum just proximal to the ligament of Treitz. No distended bowel loops appreciated in the upper abdomen. No evidence of free intraperitoneal air IMPRESSION: Feeding tube tip at the third portion of the duodenum just proximal to the ligament of Treitz. Electronically Signed   By: Franki Cabot M.D.   On: 11/11/2018 12:19    Assessment and Plan:   Derek Riley is a 67 y.o. critically  ill male, hospital day #15, admitted with gallstone pancreatitis w/ progressive decline/ progression to severe necrotizing pancreatisis and hospital course complicated by shock, metabolic acidosis, multiorgan failure including respiratory failure requiring intubation and acute renal failure requiring CRRT,  who is being seen today for the evaluation of bradycardia and abnormal EKG, at the request of Dr. Chase Caller, Critical Care Medicine.   Pt was noted by RN to have drop in HR from the 80s>>>40s with marked increase in ST elevations around noon time today. Prior EKGs from 12/4 also showed diffuse ST elevations, c/w acute pericarditis vs repol abnormalities,  although less pronounced. Troponin checked shortly after EKG change at 12:35 was negative.  Unable to obtain history from pt given current status.  He is currently in NSR, but telemetry review reveals what appears to be transient junctional bradycardia. Brief duration. Meds reviewed. He was on Precedex, which has been discontinued. This can cause bradycardia (5-42% chance). Try to avoid further use of this agent. No other AV nodal blocking agents have been given. We will continue to monitor on tele. Keep electrolytes stable. K 3.5 and Mg 1.9 today.  We will also review results of echocardiogram. Given concerns for pericarditis, will need to r/o pericardial effusion.   Suspect transient junctional bradycardia is 2/2 critical illness and likely exacerbated by the use of Precedex. Continue to monitor. We will f/u on echo.      For questions or updates, please contact [place] Please consult [email or web address] for contact info under     Signed, Lyda Jester, PA-C  11/12/2018 2:52 PM ---------------------------------------------------------------------------------------------   History and all data above reviewed.  Patient examined.  I agree with the findings as above.  Derek Riley had a transient junctional bradycardia during critical illness. He also has diffuse repolarization change/ST segment elevation. He is not able to verbalize chest pain. He does not shake is head yes or no to chest pain.   Constitutional: No acute distress, trach in place Cardiovascular: regular rhythm, normal rate, no murmurs. S1 and S2 normal. No jugular venous distention.  Respiratory: Coarse bilateral breath sounds GI : Occasional faint bowel sounds, soft and nontender. No distention.   MSK: extremities warm, well perfused.  1+ bilateral edema.  NEURO: Blinks eyes and seems alert to the conversation, but does not respond otherwise.  Moves arms and legs minimally PSYCH: Unable to be assessed due to  tracheostomy/nonverbal  All available labs, radiology testing, previous records reviewed. Agree with documented assessment and plan of my colleague as stated above with the following additions or changes:  Active Problems:   Acute gallstone pancreatitis   HTN (hypertension)   AKI (acute kidney injury) ([place])   Hyperglycemia   Hyperlipidemia   Aspiration pneumonia (HCC)   Acute hypoxemic respiratory failure (HCC)   Status post tracheostomy ([place])   Aspiration of foreign body   Ileus (HCC)    Plan: Brief episode of junctional bradycardia during critical illness. He is not on AV nodal blocking agents, and the Precedex has been stopped.  Recommend continued monitoring for additional episodes of bradycardia, however no other action is required at this time, as the bradycardia was likely influenced by critical illness.  He has diffuse ST segment elevation which could be consistent with pericarditis.  Less likely related to ischemia or LV structural change.  Recommend echocardiogram to evaluate for pericardial effusion, or evidence of constrictive physiology from pericarditis.  This will also help [place] evaluate biventricular function in the setting of critical illness.   Length  of Stay:  LOS: 15 days   Elouise Munroe, MD [facility] 4:38 PM  11/12/2018

## 2018-11-12 NOTE — Progress Notes (Signed)
Pt transported on vent to CT and returned to 5O36 without complications.

## 2018-11-12 NOTE — Progress Notes (Signed)
Pt HR dropped from 80's to 40's with marked ST elevation. Precedex gtt turned off, fentanyl gtt decreased to 164mcg. 12 Lead EKG obtained. Suctioned airway, no secretions noted. HR increased to 70's. Other VSS. MD aware, will continue to monitor.

## 2018-11-13 ENCOUNTER — Inpatient Hospital Stay (HOSPITAL_COMMUNITY): Payer: Medicare Other

## 2018-11-13 DIAGNOSIS — I10 Essential (primary) hypertension: Secondary | ICD-10-CM

## 2018-11-13 DIAGNOSIS — L8990 Pressure ulcer of unspecified site, unspecified stage: Secondary | ICD-10-CM

## 2018-11-13 DIAGNOSIS — I319 Disease of pericardium, unspecified: Secondary | ICD-10-CM

## 2018-11-13 DIAGNOSIS — Z992 Dependence on renal dialysis: Secondary | ICD-10-CM

## 2018-11-13 DIAGNOSIS — R7309 Other abnormal glucose: Secondary | ICD-10-CM

## 2018-11-13 DIAGNOSIS — Z978 Presence of other specified devices: Secondary | ICD-10-CM

## 2018-11-13 DIAGNOSIS — R001 Bradycardia, unspecified: Secondary | ICD-10-CM

## 2018-11-13 DIAGNOSIS — K8500 Idiopathic acute pancreatitis without necrosis or infection: Secondary | ICD-10-CM

## 2018-11-13 LAB — CBC
HCT: 27.3 % — ABNORMAL LOW (ref 39.0–52.0)
Hemoglobin: 9.1 g/dL — ABNORMAL LOW (ref 13.0–17.0)
MCH: 28.3 pg (ref 26.0–34.0)
MCHC: 33.3 g/dL (ref 30.0–36.0)
MCV: 85 fL (ref 80.0–100.0)
Platelets: 162 10*3/uL (ref 150–400)
RBC: 3.21 MIL/uL — ABNORMAL LOW (ref 4.22–5.81)
RDW: 17 % — ABNORMAL HIGH (ref 11.5–15.5)
WBC: 44.8 10*3/uL — ABNORMAL HIGH (ref 4.0–10.5)
nRBC: 0.3 % — ABNORMAL HIGH (ref 0.0–0.2)

## 2018-11-13 LAB — GLUCOSE, CAPILLARY
GLUCOSE-CAPILLARY: 129 mg/dL — AB (ref 70–99)
Glucose-Capillary: 127 mg/dL — ABNORMAL HIGH (ref 70–99)
Glucose-Capillary: 134 mg/dL — ABNORMAL HIGH (ref 70–99)
Glucose-Capillary: 142 mg/dL — ABNORMAL HIGH (ref 70–99)
Glucose-Capillary: 170 mg/dL — ABNORMAL HIGH (ref 70–99)
Glucose-Capillary: 234 mg/dL — ABNORMAL HIGH (ref 70–99)

## 2018-11-13 LAB — POCT I-STAT, CHEM 8
BUN: 105 mg/dL — ABNORMAL HIGH (ref 8–23)
BUN: 118 mg/dL — ABNORMAL HIGH (ref 8–23)
BUN: 105 mg/dL — AB (ref 8–23)
BUN: 118 mg/dL — AB (ref 8–23)
CHLORIDE: 90 mmol/L — AB (ref 98–111)
CREATININE: 5.7 mg/dL — AB (ref 0.61–1.24)
Calcium, Ion: 0.52 mmol/L — CL (ref 1.15–1.40)
Calcium, Ion: 0.93 mmol/L — ABNORMAL LOW (ref 1.15–1.40)
Calcium, Ion: 0.47 mmol/L — CL (ref 1.15–1.40)
Calcium, Ion: 0.9 mmol/L — ABNORMAL LOW (ref 1.15–1.40)
Chloride: 90 mmol/L — ABNORMAL LOW (ref 98–111)
Chloride: 90 mmol/L — ABNORMAL LOW (ref 98–111)
Chloride: 91 mmol/L — ABNORMAL LOW (ref 98–111)
Creatinine, Ser: 4.5 mg/dL — ABNORMAL HIGH (ref 0.61–1.24)
Creatinine, Ser: 4.3 mg/dL — ABNORMAL HIGH (ref 0.61–1.24)
Creatinine, Ser: 5.2 mg/dL — ABNORMAL HIGH (ref 0.61–1.24)
Glucose, Bld: 137 mg/dL — ABNORMAL HIGH (ref 70–99)
Glucose, Bld: 166 mg/dL — ABNORMAL HIGH (ref 70–99)
Glucose, Bld: 123 mg/dL — ABNORMAL HIGH (ref 70–99)
Glucose, Bld: 158 mg/dL — ABNORMAL HIGH (ref 70–99)
HCT: 28 % — ABNORMAL LOW (ref 39.0–52.0)
HCT: 25 % — ABNORMAL LOW (ref 39.0–52.0)
HEMATOCRIT: 23 % — AB (ref 39.0–52.0)
HEMATOCRIT: 28 % — AB (ref 39.0–52.0)
Hemoglobin: 7.8 g/dL — ABNORMAL LOW (ref 13.0–17.0)
Hemoglobin: 9.5 g/dL — ABNORMAL LOW (ref 13.0–17.0)
Hemoglobin: 8.5 g/dL — ABNORMAL LOW (ref 13.0–17.0)
Hemoglobin: 9.5 g/dL — ABNORMAL LOW (ref 13.0–17.0)
POTASSIUM: 3.9 mmol/L (ref 3.5–5.1)
Potassium: 4 mmol/L (ref 3.5–5.1)
Potassium: 3.9 mmol/L (ref 3.5–5.1)
Potassium: 4 mmol/L (ref 3.5–5.1)
Sodium: 134 mmol/L — ABNORMAL LOW (ref 135–145)
Sodium: 135 mmol/L (ref 135–145)
Sodium: 134 mmol/L — ABNORMAL LOW (ref 135–145)
Sodium: 135 mmol/L (ref 135–145)
TCO2: 26 mmol/L (ref 22–32)
TCO2: 28 mmol/L (ref 22–32)
TCO2: 27 mmol/L (ref 22–32)
TCO2: 28 mmol/L (ref 22–32)

## 2018-11-13 LAB — COMPREHENSIVE METABOLIC PANEL WITH GFR
ALT: 38 U/L (ref 0–44)
AST: 110 U/L — ABNORMAL HIGH (ref 15–41)
Albumin: 1.1 g/dL — ABNORMAL LOW (ref 3.5–5.0)
Alkaline Phosphatase: 128 U/L — ABNORMAL HIGH (ref 38–126)
Anion gap: 23 — ABNORMAL HIGH (ref 5–15)
BUN: 146 mg/dL — ABNORMAL HIGH (ref 8–23)
CO2: 24 mmol/L (ref 22–32)
Calcium: 8.4 mg/dL — ABNORMAL LOW (ref 8.9–10.3)
Chloride: 89 mmol/L — ABNORMAL LOW (ref 98–111)
Creatinine, Ser: 5.92 mg/dL — ABNORMAL HIGH (ref 0.61–1.24)
GFR calc Af Amer: 10 mL/min — ABNORMAL LOW
GFR calc non Af Amer: 9 mL/min — ABNORMAL LOW
Glucose, Bld: 163 mg/dL — ABNORMAL HIGH (ref 70–99)
Potassium: 3.7 mmol/L (ref 3.5–5.1)
Sodium: 136 mmol/L (ref 135–145)
Total Bilirubin: 5.2 mg/dL — ABNORMAL HIGH (ref 0.3–1.2)
Total Protein: 6.1 g/dL — ABNORMAL LOW (ref 6.5–8.1)

## 2018-11-13 LAB — RENAL FUNCTION PANEL
Albumin: 1.1 g/dL — ABNORMAL LOW (ref 3.5–5.0)
Anion gap: 21 — ABNORMAL HIGH (ref 5–15)
BUN: 129 mg/dL — ABNORMAL HIGH (ref 8–23)
CALCIUM: 8.9 mg/dL (ref 8.9–10.3)
CO2: 26 mmol/L (ref 22–32)
Chloride: 88 mmol/L — ABNORMAL LOW (ref 98–111)
Creatinine, Ser: 5.53 mg/dL — ABNORMAL HIGH (ref 0.61–1.24)
GFR calc Af Amer: 11 mL/min — ABNORMAL LOW (ref >60)
GFR calc non Af Amer: 10 mL/min — ABNORMAL LOW (ref >60)
Glucose, Bld: 190 mg/dL — ABNORMAL HIGH (ref 70–99)
Phosphorus: 5.5 mg/dL — ABNORMAL HIGH (ref 2.5–4.6)
Potassium: 3.7 mmol/L (ref 3.5–5.1)
Sodium: 135 mmol/L (ref 135–145)

## 2018-11-13 LAB — CALCIUM, IONIZED: Calcium, Ionized, Serum: 5.6 mg/dL (ref 4.5–5.6)

## 2018-11-13 LAB — ECHOCARDIOGRAM COMPLETE
Height: 72 in
Weight: 3255.75 oz

## 2018-11-13 LAB — PHOSPHORUS: Phosphorus: 6.5 mg/dL — ABNORMAL HIGH (ref 2.5–4.6)

## 2018-11-13 NOTE — Care Management Note (Signed)
Case Management Note  Patient Details  Name: [name] MRN: [id number] Date of Birth: 1951/11/05  Subjective/Objective:   Pt admitted with necrotizing pancreatitis                 Action/Plan:    PTA from home.   Pt remains ventilated via ETT tube on IV sedation.  Pt requiring ongoing HD however CRRT paused for line holiday.    Expected Discharge Date:                  Expected Discharge Plan:  Long Term Acute Care (LTAC)  In-House Referral:  Clinical Social Work  Discharge planning Services  CM Consult  Post Acute Care Choice:    Choice offered to:     DME Arranged:    DME Agency:     HH Arranged:    [place] Agency:     Status of Service:     If discussed at [employer], dates discussed:    Additional Comments: 11/13/2018 If pt requires trach and able to stay of CRRT - pt may benefit from [facility] referral.  CM will continue to follow [name], RN 11/13/2018, 8:45 AM

## 2018-11-13 NOTE — Progress Notes (Signed)
[facility] Progress Note    Assessment/ Plan:    1. Dialysis dependent AKI from ATN, anuric; off CRRT 11/27, back on 11/30.  Paused 12/3 for line holiday, to resume today after line re-inserted.  Aggressive vol removal as BP allows.    2. Septic shock secondary to severe necrotizing pancreatitis; previously on meropenem and eraxis, ID following, off antibiotics for now, observing WBC and pressor requirements (off).  CT abd/ pelvis 12/2 with increased inflammation/ stranding, no pseudocyst formation (without contrast).  CT 12/5 with contrast- necrosis + more organized fluid collection   Blood cultures 11/30 negative so far.  Will need cholecystectomy when acute pancreatitis resolves. Line holiday- HD cath removed 12/3.  3. Acute hypoxic RF: extubated 11/26, reintubated 11/29, s/p trach 12/1  4. Hypocalcemia; stable on 2.5Ca bath; corrects 2/2 hypoalbuminemia  5. Anemia- transfuse prn  6. Nutrition- s/p TPN, now on TF and off insulin gtt  7. Temp HD cath present since 11/22-- removed 12/3.  8. Possible pericarditis: per primary  9. Dispo: remains in ICU  Subjective:    No events.  To have line inserted again today- we will resume CRRT with aggressive vol removal.  Gtts consolidated yesterday.      Objective:   BP (!) 119/56   Pulse 75   Temp 98.1 F (36.7 C) (Oral)   Resp 19   Ht 6' (1.829 m)   Wt 92.3 kg   SpO2 100%   BMI 27.60 kg/m   Intake/Output Summary (Last 24 hours) at 11/13/2018 2423 Last data filed at 11/13/2018 0700 Gross per 24 hour  Intake 2218.85 ml  Output 1100 ml  Net 1118.85 ml   Weight change: 0.2 kg  Physical Exam: Gen: ill-jaundiced, not responsive to stimuli HEENT + trach without drainage CVS: RRR, no rub appreciated Resp: rhonchrous bilaterally Abd: soft, distended and tender in epigastrum, not rigid  Ext:3+ diffuse anasarca  Imaging: Ct Abdomen W Contrast  Result Date: 11/12/2018 CLINICAL DATA:  Elevated liver function  tests.  Known pancreatitis. EXAM: CT ABDOMEN AND PELVIS WITH CONTRAST TECHNIQUE: Multidetector CT imaging of the abdomen and pelvis was performed following the standard protocol following the bolus administration of intravenous contrast. CONTRAST:  122m OMNIPAQUE IOHEXOL 300 MG/ML  SOLN COMPARISON:  11/09/2018 FINDINGS: Lower chest: Small bilateral pleural effusions again noted with bilateral lower lobe collapse/consolidation. Small pericardial effusion is stable. Hepatobiliary: No focal abnormality within the liver parenchyma. Gallbladder is decompressed with calcified stones visible in the lumen. No intrahepatic or extrahepatic biliary dilation. Pancreas: There is extensive peripancreatic edema/inflammation. The peripancreatic fluid appears more organized than on the prior study and is starting developed a thin enhancing rim in some areas. Short segment of the pancreatic body does not enhance on today's study raising the question of pancreatic necrosis. No dilatation of the main pancreatic duct. Spleen: No splenomegaly. No focal mass lesion. Adrenals/Urinary Tract: No adrenal nodule or mass. Kidneys are unremarkable. Stomach/Bowel: NG tube tip is in the gastric antrum. The feeding tube is looped in the stomach before passing through the pylorus with distal tip positioned in the distal duodenum. No small bowel wall thickening. No small bowel dilatation. The terminal ileum is normal. The appendix is not visualized, but there is no edema or inflammation in the region of the cecum. No gross colonic mass. No colonic wall thickening. Vascular/Lymphatic: There is abdominal aortic atherosclerosis without aneurysm. Scattered small peripancreatic lymph nodes evident. No retroperitoneal lymphadenopathy. No pelvic sidewall lymphadenopathy. Reproductive: The prostate gland and seminal vesicles  have normal imaging features. Other: Moderate amount of fluid is identified in the retroperitoneal space of the abdomen. Small volume  intraperitoneal free fluid evident in the para colic gutters and pelvis. Musculoskeletal: Diffuse body wall edema evident. No worrisome lytic or sclerotic osseous abnormality. IMPRESSION: 1. Persistent peripancreatic edema/fluid. Fluid around the pancreas has become slightly more organized in the interval with a thin rim evident in some areas. 2. Short segment of pancreatic parenchyma in the body of pancreas does not enhance. Imaging features are suspicious for pancreatic necrosis. 3. No intra or extrahepatic biliary duct dilatation. 4. Cholelithiasis. 5. Small volume intraperitoneal free fluid. 6. Small bilateral pleural effusions with bilateral lower lobe collapse/consolidation. 7.  Aortic Atherosclerois (ICD10-170.0) Electronically Signed   By: Misty Stanley M.D.   On: 11/12/2018 14:40   Dg Abd Portable 1v  Result Date: 11/11/2018 CLINICAL DATA:  Feeding tube placement EXAM: PORTABLE ABDOMEN - 1 VIEW COMPARISON:  CT abdomen dated 11/09/2018. Plain film abdomen dated 11/08/2018. FINDINGS: Feeding tube has advanced into the duodenum, tip likely at the third portion of the duodenum just proximal to the ligament of Treitz. No distended bowel loops appreciated in the upper abdomen. No evidence of free intraperitoneal air IMPRESSION: Feeding tube tip at the third portion of the duodenum just proximal to the ligament of Treitz. Electronically Signed   By: Franki Cabot M.D.   On: 11/11/2018 12:19    Labs: BMET Recent Labs  Lab 11/10/18 0439  11/10/18 1454  11/11/18 0426 11/11/18 0428 11/11/18 0542 11/11/18 0547 11/11/18 1405 11/11/18 1453 11/12/18 0525 11/12/18 1631 11/13/18 0437  NA 132*   < > 133*   < > 137  --  135 137 138 138 135 134* 135  K 3.8   < > 3.5   < > 3.2*  --  3.4* 3.2* 3.1* 3.3* 3.5 3.9 3.7  CL 88*   < > 88*   < > 86*  --  86* 85* 84* 85* 86* 86* 88*  CO2 27  --  29  --  35*  --   --   --   --  33* '31 27 26  '$ GLUCOSE 317*   < > 311*   < > 159*  --  173* 246* 267* 166* 168* 152*  190*  BUN 44*   < > 38*   < > 38*  --  36* 30* 30* 38* 70* 98* 129*  CREATININE 3.08*   < > 2.79*   < > 2.41*  --  2.30* 1.80* 1.70* 2.24* 3.38* 4.37* 5.53*  CALCIUM 8.8*  --  9.5  --  10.3  --   --   --   --  10.7* 9.9 9.3 8.9  PHOS 3.4  3.3  --  2.5  --  2.1* 2.2*  --   --   --  2.2* 3.3 4.2 5.5*   < > = values in this interval not displayed.   CBC Recent Labs  Lab 11/09/18 0403  11/10/18 0439  11/11/18 0428 11/11/18 0542 11/11/18 0547 11/11/18 1405 11/12/18 0525  WBC 68.9*  --  78.1*  --  61.7*  --   --   --  54.4*  NEUTROABS 46.8*  --   --   --   --   --   --   --   --   HGB 7.3*   < > 6.7*   < > 7.9* 9.5* 10.2* 11.2* 7.6*  HCT 22.6*   < > 20.8*   < >  23.7* 28.0* 30.0* 33.0* 22.8*  MCV 86.9  --  86.0  --  84.6  --   --   --  85.1  PLT 165  --  183  --  170  --   --   --  182   < > = values in this interval not displayed.    Medications:    . chlorhexidine gluconate (MEDLINE KIT)  15 mL Mouth Rinse BID  . feeding supplement (PRO-STAT SUGAR FREE 64)  30 mL Per Tube BID  . heparin injection (subcutaneous)  5,000 Units Subcutaneous Q8H  . hydrocortisone sodium succinate  25 mg Intravenous Daily  . HYDROmorphone  2 mg Per Tube Q4H  . insulin aspart  10 Units Subcutaneous Q4H  . insulin aspart  3-9 Units Subcutaneous Q4H  . insulin detemir  25 Units Subcutaneous Q12H  . mouth rinse  15 mL Mouth Rinse 10 times per day  . pantoprazole (PROTONIX) IV  40 mg Intravenous Daily  . sodium chloride flush  10-40 mL Intracatheter Q12H      Madelon Lips, MD 11/13/2018, 9:39 AM

## 2018-11-13 NOTE — Progress Notes (Signed)
Royal notified to clarify that the floor team will be coming by to place HD cath for CRRT can be restarted. [facility] doctor spoke with floor team and they do not feel it is necessary at the moment. Charge nurse spoke with floor team and stressed the need for HD cath tonight, however doctors did not feel it was necessary and it can be reassessed by day shift rounding team.

## 2018-11-13 NOTE — Progress Notes (Signed)
Hemodialysis Insertion Procedure Note Derek Riley [facility] [id number] [birth year]  Procedure: Insertion of Hemodialysis Catheter Type: 3 port  Indications: Hemodialysis   Procedure Details Consent: Risks of procedure as well as the alternatives and risks of each were explained to the (patient/caregiver).  Consent for procedure obtained. Time Out: Verified patient identification, verified procedure, site/side was marked, verified correct patient position, special equipment/implants available, medications/allergies/relevent history reviewed, required imaging and test results available.  Performed  Maximum sterile technique was used including antiseptics, cap, gloves, gown, hand hygiene, mask and sheet. Skin prep: Chlorhexidine; local anesthetic administered A antimicrobial bonded/coated triple lumen catheter was placed in the right femoral vein due to patient being a dialysis patient using the Seldinger technique. Ultrasound guidance used.Yes.   Catheter placed to 20 cm. Blood aspirated via all 3 ports and then flushed x 3. Line sutured x 2 and dressing applied.  Evaluation Blood flow good Complications: No apparent complications Patient did tolerate procedure well. Chest X-ray ordered to verify placement.  Not needed  [facility] Minor ACNP Maryanna Shape PCCM Pager [phone] till 1 pm If no answer page [phone] 11/13/2018, 12:41 PM

## 2018-11-13 NOTE — Progress Notes (Signed)
  Echocardiogram 2D Echocardiogram has been performed.  Derek Riley 11/13/2018, 11:09 AM

## 2018-11-13 NOTE — Progress Notes (Signed)
Patient had vomiting episode during routine care with 2 nurses present. Pt began vomiting yellow emesis. Nurse turned off patient tube feed and resumed suction from NG tube. Nurse paged MD and notified of current situation. MD confirmed resuming suction and stopping tube feed. Will resume tube feed at low rate once patient is able to tolerate.

## 2018-11-13 NOTE — Progress Notes (Signed)
SLP Cancellation Note  Patient Details Name: Derek Riley MRN: [id number] DOB: [birth year]   Cancelled treatment:       Reason Eval/Treat Not Completed: Patient not medically ready   Altair Appenzeller, Katherene Ponto 11/13/2018, 10:40 AM

## 2018-11-13 NOTE — Progress Notes (Signed)
NAME:  Derek Riley, MRN:  [id number], DOB:  07-14-1951, LOS: 62 ADMISSION DATE:  10/17/2018, CONSULTATION DATE:  11/22 REFERRING MD:  11/22, CHIEF COMPLAINT:  Worsening metabolic acidosis and renal failure w/ clinical decline  Brief History   67 year old male admitted 11/20 w/ gallstone pancreatitis. PCCM consulted 11/22 for progressive metabolic acidosis, acute renal failure and persistent clinical decline.   Past Medical History  Diabetes, gallstones, hypertension.  Significant Hospital Events   11/20 transferred to [facility] from [place] w/ acute gallstone pancreatitis. GI & surgicals services consulted. Started On supportive IVFs, empiric zosyn and analgesic support.  Surgical service is planning on cholecystectomy once pancreatitis improved.  Initial lipase on arrival to [place], creatinine 1.72 11/21: MRCP 11/21 showing extensive pancreatitis, possible hemorrhagic peripancreatic fluid no choledocholithiasis, lipase 3361, creatinine up to 4.91, anion gap 13.  No role for ERCP continued supportive care 11/22: Creatinine up to 6.95, gap now 16, progressive diffuse mottling, worse pain, critical care consulted for shock, and progressive multiorgan failure, moving to intensive care for CRRT as well as hemodynamic support. 3.7 liters + since admit, developed worsening resp failure requiring intubation shortly after arrival. Possible aspiration during intubation  11/26 started on meropenem (d/t rising CBC) CRRT stopped 11/27 extubated, pressors off.  11/28 look weaker. WIll probably need re-intubation. 11/29 reintubated for increasing WOB, likely aspiration even overnight.  11/30: Remains intubated.  White blood cell count now up to 62,000.  CRRT resumed 12/1: No significant change.  Ongoing cuff leak opting for early tracheostomy versus endotracheal tube change.  12/2: tolerating PS wean at 10/5, CT of the abdomen/pelvis ordered  Consults:  11/20 GI and surgical services 11/22  critical care  Procedures:  11/22 Foley catheter 11/22 right IJ CVL (HD)>>> 11/22 OETT 11/22>>>12/1 Trach 12/1 (JY)>>>  Significant Diagnostic Tests:  MRCP 11/21: Acute pancreatitis with extensive pancreatic and periPancreatic inflammatory changes, including proteinaceous and/or hemorrhagic peri-pancreatic fluid.  No choledocholithiasis or biliary tract obstruction.  Small volume of ascites, trace bilateral pleural effusions.  Bilateral basilar volume loss  Micro Data:  Cultures negative  Antimicrobials:  Zosyn 11/20>>>11/26 Meropenem 11/26>>> 11/11/2018 11/11/2018 all antibiotics stopped by ID  Interim history/subjective:  No issues overnight.  Plans for dialysis catheter placement today.  Blood pressure remained stable.  Creasing continuous fentanyl sedation. stop TPN yesterday.  Cardiology saw yesterday for bradycardic event.  Discussed with cardiology at bedside this morning.  Discussed with nursing staff.  Objective   Blood pressure (!) 114/59, pulse 75, temperature 98 F (36.7 C), temperature source Axillary, resp. rate 18, height 6' (1.829 m), weight 92.3 kg, SpO2 99 %.    Vent Mode: PRVC FiO2 (%):  [30 %] 30 % Set Rate:  [18 bmp] 18 bmp Vt Set:  [620 mL] 620 mL PEEP:  [5 cmH20] 5 cmH20 Plateau Pressure:  [17 cmH20-22 cmH20] 19 cmH20   Intake/Output Summary (Last 24 hours) at 11/13/2018 1249 Last data filed at 11/13/2018 1100 Gross per 24 hour  Intake 1812 ml  Output 1550 ml  Net 262 ml   Filed Weights   11/11/18 0500 11/12/18 0500 11/13/18 0433  Weight: 91.7 kg 92.1 kg 92.3 kg   Examination: General appearance: 67 y.o., male, chronically ill-appearing, trach in place on ventilator Eyes: anicteric sclerae, tracking appropriately HENT: NCAT; oropharynx, MMM Neck: Trachea midline; tracheostomy tube in place, no JVD Lungs: CTAB, no crackles, no wheeze, CV: Regular rate and rhythm, S1, S2, no MRGs  Abdomen: Distended, tender to palpation, grimaces with  palpation to  the mid abdomen Extremities: Significant 3+ bilateral upper extremity and flank lower edema., radial and DP pulses present bilaterally  Skin: Tense, edematous Psych: No evidence of active delirium Neuro: Alert to voice, moves all 4 extremities  Resolved Hospital Problem list     Assessment & Plan:   Acute gallstone pancreatitis development of pancreatic necrosis no obvious abscess or cystic formation. -No evidence of choledocholelithiasis on MRCP Plan At this point I believe he is significantly third space with interstitial edema and significant positive fluid continue bouts. We have stopped the TPN We will stop NG tube suctioning to see if he tolerates We will continue postpyloric tube feeding with smallbore feeding tube.  Abdominal pain secondary to above - Stop continuous fentanyl Scheduled oral Dilaudid with PRN oral Need to titrate this for adequate control.  Ileus Plan Continue postpyloric feeding it looks like this has resolved.  Ongoing leukocytosis in setting of pancreatic inflammation Plan Continue to de-escalate hydrocortisone I do believe that a portion of this is from that as well as ongoing pancreatic inflammation. We will continue to follow  Acute hypoxic respiratory failure requiring mechanical ventilation in the setting of acute aspiration event 11/29, progressive atelectasis, and decreased abdominal compliance from pain and impaired cough status post tracheostomy currently requiring ventilatory support. Plan All continuous sedation stopped.  Likely will need prolonged ventilatory wean Likely candidate for LTAC  Severe deconditioning and protein calorie malnutrition Plan Tube feeding at goal  Acute kidney injury with baseline creatinine 1.  This was hemodynamically mediated plus/minus contrast-induced nephropathy Plan Vascular catheter placement today we will start CRRT for negative fluid balance Discussed with nephrology.  We appreciate the  recommendations.  Ongoing hypotension Plan This has resolved as he is off continuous sedation  Hyperglycemia Plan Subcu insulin as needed with every 4 CBGs  Elevated cumulative fluid balance and electrolyte imbalance: Hyperkalemia Space with significant edema Plan Electrolytes will be managed with CRRT.  Bradycardic event, ST elevation I suspect this was related to metabolic derangements as well as trivial pericardial effusion. Discussed with Dr. Margaretann Loveless.  May have a small presence of pericarditis. We will continue to observe  This patient is critically ill with multiple organ system failure; which, requires frequent high complexity decision making, assessment, support, evaluation, and titration of therapies. This was completed through the application of advanced monitoring technologies and extensive interpretation of multiple databases. During this encounter critical care time was devoted to patient care services described in this note for 36 minutes.   Garner Nash, DO [facility] 11/13/2018 12:49 PM  Personal pager: [phone] If unanswered, please page CCM On-call: [phone]

## 2018-11-13 NOTE — Progress Notes (Signed)
143ml of Fentanyl Infusion wasted in the sink. Wasted with Sindy Messing RN.

## 2018-11-13 NOTE — Progress Notes (Signed)
Progress Note  Patient Name: Derek Riley Date of Encounter: 11/13/2018  Primary Cardiologist: No primary care provider on file.   Subjective   Echocardiogram shows mid cavitary gradient with hyperdynamic LV function. He is volume up for the admission. No acute findings on Echo, there is a pericardial effusion, no overt signs of constrictive physiology.  Inpatient Medications    Scheduled Meds: . chlorhexidine gluconate (MEDLINE KIT)  15 mL Mouth Rinse BID  . feeding supplement (PRO-STAT SUGAR FREE 64)  30 mL Per Tube BID  . heparin injection (subcutaneous)  5,000 Units Subcutaneous Q8H  . hydrocortisone sodium succinate  25 mg Intravenous Daily  . HYDROmorphone  2 mg Per Tube Q4H  . insulin aspart  10 Units Subcutaneous Q4H  . insulin aspart  3-9 Units Subcutaneous Q4H  . insulin detemir  25 Units Subcutaneous Q12H  . mouth rinse  15 mL Mouth Rinse 10 times per day  . pantoprazole (PROTONIX) IV  40 mg Intravenous Daily  . sodium chloride flush  10-40 mL Intracatheter Q12H   Continuous Infusions: .  prismasol BGK 4/2.5 800 mL/hr at 11/08/18 0910  .  prismasol BGK 4/2.5 300 mL/hr at 11/10/18 1723  . sodium chloride    . calcium gluconate infusion for CRRT Stopped (11/11/18 1515)  . dextrose    . feeding supplement (VITAL AF 1.2 CAL) 65 mL/hr at 11/13/18 0700  . fentaNYL infusion INTRAVENOUS 75 mcg/hr (11/13/18 1200)  . norepinephrine (LEVOPHED) Adult infusion Stopped (11/12/18 1311)  . prismasol B22GK 4/0 1,500 mL/hr at 11/11/18 1432  . sodium chloride 999 mL/hr at 11/08/18 2039  . sodium citrate 2 %/dextrose 2.5% solution 3000 mL 250 mL/hr at 11/11/18 0852   PRN Meds: Place/Maintain arterial line **AND** sodium chloride, acetaminophen, dextrose, fentaNYL, heparin, HYDROmorphone, midazolam, ondansetron (ZOFRAN) IV, sodium chloride, sodium chloride flush   Vital Signs    Vitals:   11/13/18 1149 11/13/18 1200 11/13/18 1300 11/13/18 1400  BP:  (!) 123/57 (!) 116/53 (!)  112/53  Pulse:  77 73 78  Resp:  '18 18 20  '$ Temp:      TempSrc:      SpO2: 99% 99% 96% 96%  Weight:      Height:        Intake/Output Summary (Last 24 hours) at 11/13/2018 1456 Last data filed at 11/13/2018 1200 Gross per 24 hour  Intake 1586.36 ml  Output 1350 ml  Net 236.36 ml   Filed Weights   11/11/18 0500 11/12/18 0500 11/13/18 0433  Weight: 91.7 kg 92.1 kg 92.3 kg    Telemetry    Sinus rhythm - Personally Reviewed  ECG    No new - Personally Reviewed  Physical Exam   GEN: No acute distress, tracheostomy in place Cardiac: regular rhythm, normal rate, no murmurs, rubs, or gallops.  Respiratory: scattered rales GI: Soft, nontender, non-distended  MS: 2-3 + bilateral edema; No deformity. Neuro:  Nonfocal  Psych: Normal affect   Labs    Chemistry Recent Labs  Lab 11/11/18 1453 11/12/18 0525 11/12/18 1631 11/13/18 0437 11/13/18 1300  NA 138 135 134* 135 136  K 3.3* 3.5 3.9 3.7 3.7  CL 85* 86* 86* 88* 89*  CO2 33* '31 27 26 24  '$ GLUCOSE 166* 168* 152* 190* 163*  BUN 38* 70* 98* 129* 146*  CREATININE 2.24* 3.38* 4.37* 5.53* 5.92*  CALCIUM 10.7* 9.9 9.3 8.9 8.4*  PROT 7.8 6.8  --   --  6.1*  ALBUMIN 1.3*  1.3* 1.2*  1.1* 1.1* 1.1*  AST 168* 256*  --   --  110*  ALT 26 45*  --   --  38  ALKPHOS 193* 129*  --   --  128*  BILITOT 4.5* 4.4*  --   --  5.2*  GFRNONAA 29* 18* 13* 10* 9*  GFRAA 34* 21* 15* 11* 10*  ANIONGAP 20* 18* 21* 21* 23*     Hematology Recent Labs  Lab 11/11/18 0428  11/11/18 1405 11/12/18 0525 11/13/18 1300  WBC 61.7*  --   --  54.4* 44.8*  RBC 2.80*  --   --  2.68* 3.21*  HGB 7.9*   < > 11.2* 7.6* 9.1*  HCT 23.7*   < > 33.0* 22.8* 27.3*  MCV 84.6  --   --  85.1 85.0  MCH 28.2  --   --  28.4 28.3  MCHC 33.3  --   --  33.3 33.3  RDW 15.4  --   --  15.9* 17.0*  PLT 170  --   --  182 162   < > = values in this interval not displayed.    Cardiac Enzymes Recent Labs  Lab 11/11/18 2324 11/12/18 0526 11/12/18 1235    TROPONINI <0.03 <0.03 <0.03   No results for input(s): TROPIPOC in the last 168 hours.   BNPNo results for input(s): BNP, PROBNP in the last 168 hours.   DDimer No results for input(s): DDIMER in the last 168 hours.   Radiology    Ct Abdomen W Contrast  Result Date: 11/12/2018 CLINICAL DATA:  Elevated liver function tests.  Known pancreatitis. EXAM: CT ABDOMEN AND PELVIS WITH CONTRAST TECHNIQUE: Multidetector CT imaging of the abdomen and pelvis was performed following the standard protocol following the bolus administration of intravenous contrast. CONTRAST:  145m OMNIPAQUE IOHEXOL 300 MG/ML  SOLN COMPARISON:  11/09/2018 FINDINGS: Lower chest: Small bilateral pleural effusions again noted with bilateral lower lobe collapse/consolidation. Small pericardial effusion is stable. Hepatobiliary: No focal abnormality within the liver parenchyma. Gallbladder is decompressed with calcified stones visible in the lumen. No intrahepatic or extrahepatic biliary dilation. Pancreas: There is extensive peripancreatic edema/inflammation. The peripancreatic fluid appears more organized than on the prior study and is starting developed a thin enhancing rim in some areas. Short segment of the pancreatic body does not enhance on today's study raising the question of pancreatic necrosis. No dilatation of the main pancreatic duct. Spleen: No splenomegaly. No focal mass lesion. Adrenals/Urinary Tract: No adrenal nodule or mass. Kidneys are unremarkable. Stomach/Bowel: NG tube tip is in the gastric antrum. The feeding tube is looped in the stomach before passing through the pylorus with distal tip positioned in the distal duodenum. No small bowel wall thickening. No small bowel dilatation. The terminal ileum is normal. The appendix is not visualized, but there is no edema or inflammation in the region of the cecum. No gross colonic mass. No colonic wall thickening. Vascular/Lymphatic: There is abdominal aortic atherosclerosis  without aneurysm. Scattered small peripancreatic lymph nodes evident. No retroperitoneal lymphadenopathy. No pelvic sidewall lymphadenopathy. Reproductive: The prostate gland and seminal vesicles have normal imaging features. Other: Moderate amount of fluid is identified in the retroperitoneal space of the abdomen. Small volume intraperitoneal free fluid evident in the para colic gutters and pelvis. Musculoskeletal: Diffuse body wall edema evident. No worrisome lytic or sclerotic osseous abnormality. IMPRESSION: 1. Persistent peripancreatic edema/fluid. Fluid around the pancreas has become slightly more organized in the interval with a thin rim evident in some areas.  2. Short segment of pancreatic parenchyma in the body of pancreas does not enhance. Imaging features are suspicious for pancreatic necrosis. 3. No intra or extrahepatic biliary duct dilatation. 4. Cholelithiasis. 5. Small volume intraperitoneal free fluid. 6. Small bilateral pleural effusions with bilateral lower lobe collapse/consolidation. 7.  Aortic Atherosclerois (ICD10-170.0) Electronically Signed   By: Misty Stanley M.D.   On: 11/12/2018 14:40    Cardiac Studies   Echo Study Conclusions  - Left ventricle: Possible small mid cavitary gradient. The cavity   size was normal. Wall thickness was normal. Systolic function was   normal. The estimated ejection fraction was in the range of 60%   to 65%. Wall motion was normal; there were no regional wall   motion abnormalities. Left ventricular diastolic function   parameters were normal. - Atrial septum: No defect or patent foramen ovale was identified. - Pericardium, extracardiac: A trivial pericardial effusion was   identified.  Patient Profile   Assessment & Plan   Active Problems:   Acute gallstone pancreatitis   HTN (hypertension)   AKI (acute kidney injury) ([place])   Hyperglycemia   Hyperlipidemia   Aspiration pneumonia (HCC)   Acute respiratory failure with hypoxia  (HCC)   Status post tracheostomy ([place])   Aspiration of foreign body   Ileus (HCC)   Pressure injury of skin   Idiopathic acute pancreatitis   Endotracheal tube present   Status post insertion of hemodialysis catheter ([place])   Echo shows trace-small pericardial effusion without overt constriction, no tamponade.  He may have pericarditis with small effusion, will treat conservatively with acute illness currently. Consider colchicine with renal dosing if appropriate during medical recovery.   No further bradycardia.  Consider cardiology follow up as an outpatient.  [facility] will sign off. Please call with any additional questions.    For questions or updates, please contact [place] Please consult [email or web address] for contact info under        Signed, Elouise Munroe, MD  11/13/2018, 2:56 PM

## 2018-11-13 NOTE — Progress Notes (Signed)
INFECTIOUS DISEASE PROGRESS NOTE  ID: Derek Riley is a 67 y.o. male with  Active Problems:   Acute gallstone pancreatitis   HTN (hypertension)   AKI (acute kidney injury) ([place])   Hyperglycemia   Hyperlipidemia   Aspiration pneumonia (HCC)   Acute hypoxemic respiratory failure (HCC)   Status post tracheostomy ([place])   Aspiration of foreign body   Ileus (HCC)   Pressure injury of skin  Subjective: Awakens easily. No distress.  Abtx:  Anti-infectives (From admission, onward)   Start     Dose/Rate Route Frequency Ordered Stop   11/08/18 1800  anidulafungin (ERAXIS) 100 mg in sodium chloride 0.9 % 100 mL IVPB  Status:  Discontinued     100 mg 78 mL/hr over 100 Minutes Intravenous Every 24 hours 11/07/18 1642 11/11/18 1437   11/07/18 1730  anidulafungin (ERAXIS) 200 mg in sodium chloride 0.9 % 200 mL IVPB     200 mg 78 mL/hr over 200 Minutes Intravenous  Once 11/07/18 1642 11/07/18 2110   11/07/18 1000  meropenem (MERREM) 1 g in sodium chloride 0.9 % 100 mL IVPB  Status:  Discontinued    Note to Pharmacy:  Dosing per CRRT   1 g 200 mL/hr over 30 Minutes Intravenous Every 12 hours 11/07/18 0923 11/11/18 1437   11/04/18 2200  meropenem (MERREM) 500 mg in sodium chloride 0.9 % 100 mL IVPB  Status:  Discontinued    Note to Pharmacy:  Dosing per CRRT   500 mg 200 mL/hr over 30 Minutes Intravenous Every 24 hours 11/04/18 1316 11/07/18 0923   11/03/18 1400  meropenem (MERREM) 1 g in sodium chloride 0.9 % 100 mL IVPB  Status:  Discontinued    Note to Pharmacy:  Dosing per CRRT   1 g 200 mL/hr over 30 Minutes Intravenous Every 12 hours 11/03/18 1132 11/04/18 1316   10/29/18 1900  piperacillin-tazobactam (ZOSYN) IVPB 2.25 g  Status:  Discontinued     2.25 g 100 mL/hr over 30 Minutes Intravenous Every 6 hours 10/29/18 1741 11/03/18 1133   11/04/2018 2000  piperacillin-tazobactam (ZOSYN) IVPB 3.375 g  Status:  Discontinued     3.375 g 12.5 mL/hr over 240 Minutes Intravenous Every 8  hours 10/21/2018 1156 10/29/18 1741   10/17/2018 1230  piperacillin-tazobactam (ZOSYN) IVPB 3.375 g     3.375 g 100 mL/hr over 30 Minutes Intravenous  Once 10/22/2018 1156 11/06/2018 1506      Medications:  Scheduled: . chlorhexidine gluconate (MEDLINE KIT)  15 mL Mouth Rinse BID  . feeding supplement (PRO-STAT SUGAR FREE 64)  30 mL Per Tube BID  . heparin injection (subcutaneous)  5,000 Units Subcutaneous Q8H  . hydrocortisone sodium succinate  25 mg Intravenous Daily  . HYDROmorphone  2 mg Per Tube Q4H  . insulin aspart  10 Units Subcutaneous Q4H  . insulin aspart  3-9 Units Subcutaneous Q4H  . insulin detemir  25 Units Subcutaneous Q12H  . mouth rinse  15 mL Mouth Rinse 10 times per day  . pantoprazole (PROTONIX) IV  40 mg Intravenous Daily  . sodium chloride flush  10-40 mL Intracatheter Q12H    Objective: Vital signs in last 24 hours: Temp:  [98.1 F (36.7 C)-100.1 F (37.8 C)] 98.1 F (36.7 C) (12/06 0353) Pulse Rate:  [63-90] 79 (12/06 0700) Resp:  [16-20] 18 (12/06 0700) BP: (111-155)/(39-69) 128/58 (12/06 0700) SpO2:  [93 %-100 %] 99 % (12/06 0700) Arterial Line BP: (105-160)/(39-57) 153/46 (12/06 0700) FiO2 (%):  [30 %]  30 % (12/06 0400) Weight:  [92.3 kg] 92.3 kg (12/06 0433)   General appearance: alert and mild distress Neck: L IJ TLC appears clean.  Resp: clear to auscultation bilaterally Cardio: regular rate and rhythm GI: normal findings: bowel sounds normal and soft, non-tender and abnormal findings:  distended and mild  Lab Results Recent Labs    11/11/18 0428  11/11/18 1405  11/12/18 0525 11/12/18 1631 11/13/18 0437  WBC 61.7*  --   --   --  54.4*  --   --   HGB 7.9*   < > 11.2*  --  7.6*  --   --   HCT 23.7*   < > 33.0*  --  22.8*  --   --   NA  --    < > 138   < > 135 134* 135  K  --    < > 3.1*   < > 3.5 3.9 3.7  CL  --    < > 84*   < > 86* 86* 88*  CO2  --   --   --    < > '31 27 26  '$ BUN  --    < > 30*   < > 70* 98* 129*  CREATININE  --    < >  1.70*   < > 3.38* 4.37* 5.53*   < > = values in this interval not displayed.   Liver Panel Recent Labs    11/11/18 1453 11/12/18 0525 11/12/18 1631 11/13/18 0437  PROT 7.8 6.8  --   --   ALBUMIN 1.3*  1.3* 1.2* 1.1* 1.1*  AST 168* 256*  --   --   ALT 26 45*  --   --   ALKPHOS 193* 129*  --   --   BILITOT 4.5* 4.4*  --   --   BILIDIR 3.0*  --   --   --   IBILI 1.5*  --   --   --    Sedimentation Rate No results for input(s): ESRSEDRATE in the last 72 hours. C-Reactive Protein No results for input(s): CRP in the last 72 hours.  Microbiology: Recent Results (from the past 240 hour(s))  C difficile quick scan w PCR reflex     Status: None   Collection Time: 11/07/18  9:35 AM  Result Value Ref Range Status   C Diff antigen NEGATIVE NEGATIVE Final   C Diff toxin NEGATIVE NEGATIVE Final   C Diff interpretation No C. difficile detected.  Final    Comment: Performed at [facility], [place] [address] [place], [place] [address]  Culture, blood (routine x 2)     Status: None   Collection Time: 11/07/18  5:46 PM  Result Value Ref Range Status   Specimen Description BLOOD BLOOD RIGHT HAND  Final   Special Requests   Final    BOTTLES DRAWN AEROBIC ONLY Blood Culture results may not be optimal due to an inadequate volume of blood received in culture bottles   Culture   Final    NO GROWTH 5 DAYS Performed at [facility], [place] [address] [place], [place] [address]    Report Status 11/12/2018 FINAL  Final  Culture, blood (routine x 2)     Status: None   Collection Time: 11/07/18  5:46 PM  Result Value Ref Range Status   Specimen Description BLOOD BLOOD LEFT HAND  Final   Special Requests   Final    BOTTLES DRAWN  AEROBIC ONLY Blood Culture adequate volume   Culture   Final    NO GROWTH 5 DAYS Performed at [facility], [place] [address], [place],  [address]    Report Status 11/12/2018 FINAL  Final    Studies/Results: Ct Abdomen W Contrast  Result Date:  11/12/2018 CLINICAL DATA:  Elevated liver function tests.  Known pancreatitis. EXAM: CT ABDOMEN AND PELVIS WITH CONTRAST TECHNIQUE: Multidetector CT imaging of the abdomen and pelvis was performed following the standard protocol following the bolus administration of intravenous contrast. CONTRAST:  172m OMNIPAQUE IOHEXOL 300 MG/ML  SOLN COMPARISON:  11/09/2018 FINDINGS: Lower chest: Small bilateral pleural effusions again noted with bilateral lower lobe collapse/consolidation. Small pericardial effusion is stable. Hepatobiliary: No focal abnormality within the liver parenchyma. Gallbladder is decompressed with calcified stones visible in the lumen. No intrahepatic or extrahepatic biliary dilation. Pancreas: There is extensive peripancreatic edema/inflammation. The peripancreatic fluid appears more organized than on the prior study and is starting developed a thin enhancing rim in some areas. Short segment of the pancreatic body does not enhance on today's study raising the question of pancreatic necrosis. No dilatation of the main pancreatic duct. Spleen: No splenomegaly. No focal mass lesion. Adrenals/Urinary Tract: No adrenal nodule or mass. Kidneys are unremarkable. Stomach/Bowel: NG tube tip is in the gastric antrum. The feeding tube is looped in the stomach before passing through the pylorus with distal tip positioned in the distal duodenum. No small bowel wall thickening. No small bowel dilatation. The terminal ileum is normal. The appendix is not visualized, but there is no edema or inflammation in the region of the cecum. No gross colonic mass. No colonic wall thickening. Vascular/Lymphatic: There is abdominal aortic atherosclerosis without aneurysm. Scattered small peripancreatic lymph nodes evident. No retroperitoneal lymphadenopathy. No pelvic sidewall lymphadenopathy. Reproductive: The prostate gland and seminal vesicles have normal imaging features. Other: Moderate amount of fluid is identified in the  retroperitoneal space of the abdomen. Small volume intraperitoneal free fluid evident in the para colic gutters and pelvis. Musculoskeletal: Diffuse body wall edema evident. No worrisome lytic or sclerotic osseous abnormality. IMPRESSION: 1. Persistent peripancreatic edema/fluid. Fluid around the pancreas has become slightly more organized in the interval with a thin rim evident in some areas. 2. Short segment of pancreatic parenchyma in the body of pancreas does not enhance. Imaging features are suspicious for pancreatic necrosis. 3. No intra or extrahepatic biliary duct dilatation. 4. Cholelithiasis. 5. Small volume intraperitoneal free fluid. 6. Small bilateral pleural effusions with bilateral lower lobe collapse/consolidation. 7.  Aortic Atherosclerois (ICD10-170.0) Electronically Signed   By: EMisty StanleyM.D.   On: 11/12/2018 14:40   Dg Abd Portable 1v  Result Date: 11/11/2018 CLINICAL DATA:  Feeding tube placement EXAM: PORTABLE ABDOMEN - 1 VIEW COMPARISON:  CT abdomen dated 11/09/2018. Plain film abdomen dated 11/08/2018. FINDINGS: Feeding tube has advanced into the duodenum, tip likely at the third portion of the duodenum just proximal to the ligament of Treitz. No distended bowel loops appreciated in the upper abdomen. No evidence of free intraperitoneal air IMPRESSION: Feeding tube tip at the third portion of the duodenum just proximal to the ligament of Treitz. Electronically Signed   By: SFranki CabotM.D.   On: 11/11/2018 12:19     Assessment/Plan: Necrotizing pancreatitis VDRF AKI  Total days of antibiotics: 14 (stopped 12/5)  Day 4 anidulafungin             Day 9 meropenem              Day 5Piperacillin-tazobactam                                                                 WBC not resulted today He's been afebrile.  Would continue to watch off anbx.  We'll be available as needed.          Bobby Rumpf MD,  FACP Infectious Diseases (pager) [phone] [email or web address] 11/13/2018, 7:15 AM  LOS: 16 days

## 2018-11-13 NOTE — Progress Notes (Signed)
PT Cancellation Note  Patient Details Name: Derek Riley MRN: [id number] DOB: [birth year]   Cancelled Treatment:    Reason Eval/Treat Not Completed: Patient not medically ready pt with CRRT today, discussed with RN will hold and cont to follow.   Derek Riley, PT, DPT Acute Rehabilitation Services Pager: [phone] Office: [place] 11/13/2018, 5:30 PM

## 2018-11-14 LAB — POCT I-STAT, CHEM 8
BUN: 110 mg/dL — ABNORMAL HIGH (ref 8–23)
BUN: 95 mg/dL — ABNORMAL HIGH (ref 8–23)
BUN: 89 mg/dL — ABNORMAL HIGH (ref 8–23)
BUN: 97 mg/dL — ABNORMAL HIGH (ref 8–23)
BUN: 84 mg/dL — ABNORMAL HIGH (ref 8–23)
BUN: 93 mg/dL — ABNORMAL HIGH (ref 8–23)
BUN: 77 mg/dL — ABNORMAL HIGH (ref 8–23)
BUN: 90 mg/dL — ABNORMAL HIGH (ref 8–23)
BUN: 69 mg/dL — ABNORMAL HIGH (ref 8–23)
BUN: 86 mg/dL — ABNORMAL HIGH (ref 8–23)
CALCIUM ION: 0.45 mmol/L — AB (ref 1.15–1.40)
CHLORIDE: 91 mmol/L — AB (ref 98–111)
CHLORIDE: 89 mmol/L — AB (ref 98–111)
CREATININE: 3.5 mg/dL — AB (ref 0.61–1.24)
Calcium, Ion: 0.9 mmol/L — ABNORMAL LOW (ref 1.15–1.40)
Calcium, Ion: 0.44 mmol/L — CL (ref 1.15–1.40)
Calcium, Ion: 0.9 mmol/L — ABNORMAL LOW (ref 1.15–1.40)
Calcium, Ion: 0.44 mmol/L — CL (ref 1.15–1.40)
Calcium, Ion: 0.9 mmol/L — ABNORMAL LOW (ref 1.15–1.40)
Calcium, Ion: 0.44 mmol/L — CL (ref 1.15–1.40)
Calcium, Ion: 0.93 mmol/L — ABNORMAL LOW (ref 1.15–1.40)
Calcium, Ion: 0.43 mmol/L — CL (ref 1.15–1.40)
Calcium, Ion: 0.96 mmol/L — ABNORMAL LOW (ref 1.15–1.40)
Chloride: 90 mmol/L — ABNORMAL LOW (ref 98–111)
Chloride: 90 mmol/L — ABNORMAL LOW (ref 98–111)
Chloride: 89 mmol/L — ABNORMAL LOW (ref 98–111)
Chloride: 90 mmol/L — ABNORMAL LOW (ref 98–111)
Chloride: 89 mmol/L — ABNORMAL LOW (ref 98–111)
Chloride: 90 mmol/L — ABNORMAL LOW (ref 98–111)
Chloride: 90 mmol/L — ABNORMAL LOW (ref 98–111)
Chloride: 90 mmol/L — ABNORMAL LOW (ref 98–111)
Creatinine, Ser: 3.8 mg/dL — ABNORMAL HIGH (ref 0.61–1.24)
Creatinine, Ser: 4.8 mg/dL — ABNORMAL HIGH (ref 0.61–1.24)
Creatinine, Ser: 4.5 mg/dL — ABNORMAL HIGH (ref 0.61–1.24)
Creatinine, Ser: 3.4 mg/dL — ABNORMAL HIGH (ref 0.61–1.24)
Creatinine, Ser: 4.1 mg/dL — ABNORMAL HIGH (ref 0.61–1.24)
Creatinine, Ser: 3.2 mg/dL — ABNORMAL HIGH (ref 0.61–1.24)
Creatinine, Ser: 3.8 mg/dL — ABNORMAL HIGH (ref 0.61–1.24)
Creatinine, Ser: 3 mg/dL — ABNORMAL HIGH (ref 0.61–1.24)
Creatinine, Ser: 3.8 mg/dL — ABNORMAL HIGH (ref 0.61–1.24)
Glucose, Bld: 102 mg/dL — ABNORMAL HIGH (ref 70–99)
Glucose, Bld: 144 mg/dL — ABNORMAL HIGH (ref 70–99)
Glucose, Bld: 137 mg/dL — ABNORMAL HIGH (ref 70–99)
Glucose, Bld: 91 mg/dL (ref 70–99)
Glucose, Bld: 133 mg/dL — ABNORMAL HIGH (ref 70–99)
Glucose, Bld: 85 mg/dL (ref 70–99)
Glucose, Bld: 134 mg/dL — ABNORMAL HIGH (ref 70–99)
Glucose, Bld: 78 mg/dL (ref 70–99)
Glucose, Bld: 109 mg/dL — ABNORMAL HIGH (ref 70–99)
Glucose, Bld: 164 mg/dL — ABNORMAL HIGH (ref 70–99)
HCT: 23 % — ABNORMAL LOW (ref 39.0–52.0)
HCT: 26 % — ABNORMAL LOW (ref 39.0–52.0)
HCT: 26 % — ABNORMAL LOW (ref 39.0–52.0)
HCT: 28 % — ABNORMAL LOW (ref 39.0–52.0)
HCT: 25 % — ABNORMAL LOW (ref 39.0–52.0)
HCT: 26 % — ABNORMAL LOW (ref 39.0–52.0)
HCT: 25 % — ABNORMAL LOW (ref 39.0–52.0)
HCT: 26 % — ABNORMAL LOW (ref 39.0–52.0)
HCT: 25 % — ABNORMAL LOW (ref 39.0–52.0)
HEMATOCRIT: 28 % — AB (ref 39.0–52.0)
Hemoglobin: 7.8 g/dL — ABNORMAL LOW (ref 13.0–17.0)
Hemoglobin: 8.8 g/dL — ABNORMAL LOW (ref 13.0–17.0)
Hemoglobin: 8.8 g/dL — ABNORMAL LOW (ref 13.0–17.0)
Hemoglobin: 9.5 g/dL — ABNORMAL LOW (ref 13.0–17.0)
Hemoglobin: 8.5 g/dL — ABNORMAL LOW (ref 13.0–17.0)
Hemoglobin: 8.8 g/dL — ABNORMAL LOW (ref 13.0–17.0)
Hemoglobin: 8.5 g/dL — ABNORMAL LOW (ref 13.0–17.0)
Hemoglobin: 8.8 g/dL — ABNORMAL LOW (ref 13.0–17.0)
Hemoglobin: 8.5 g/dL — ABNORMAL LOW (ref 13.0–17.0)
Hemoglobin: 9.5 g/dL — ABNORMAL LOW (ref 13.0–17.0)
POTASSIUM: 4 mmol/L (ref 3.5–5.1)
POTASSIUM: 4.2 mmol/L (ref 3.5–5.1)
Potassium: 3.9 mmol/L (ref 3.5–5.1)
Potassium: 3.9 mmol/L (ref 3.5–5.1)
Potassium: 4.2 mmol/L (ref 3.5–5.1)
Potassium: 4 mmol/L (ref 3.5–5.1)
Potassium: 4.1 mmol/L (ref 3.5–5.1)
Potassium: 4.4 mmol/L (ref 3.5–5.1)
Potassium: 4.1 mmol/L (ref 3.5–5.1)
Potassium: 4.3 mmol/L (ref 3.5–5.1)
SODIUM: 135 mmol/L (ref 135–145)
SODIUM: 135 mmol/L (ref 135–145)
Sodium: 134 mmol/L — ABNORMAL LOW (ref 135–145)
Sodium: 136 mmol/L (ref 135–145)
Sodium: 134 mmol/L — ABNORMAL LOW (ref 135–145)
Sodium: 137 mmol/L (ref 135–145)
Sodium: 137 mmol/L (ref 135–145)
Sodium: 137 mmol/L (ref 135–145)
Sodium: 134 mmol/L — ABNORMAL LOW (ref 135–145)
Sodium: 136 mmol/L (ref 135–145)
TCO2: 29 mmol/L (ref 22–32)
TCO2: 30 mmol/L (ref 22–32)
TCO2: 29 mmol/L (ref 22–32)
TCO2: 32 mmol/L (ref 22–32)
TCO2: 31 mmol/L (ref 22–32)
TCO2: 32 mmol/L (ref 22–32)
TCO2: 32 mmol/L (ref 22–32)
TCO2: 34 mmol/L — ABNORMAL HIGH (ref 22–32)
TCO2: 31 mmol/L (ref 22–32)
TCO2: 34 mmol/L — ABNORMAL HIGH (ref 22–32)

## 2018-11-14 LAB — POCT I-STAT 3, ART BLOOD GAS (G3+)
Acid-Base Excess: 10 mmol/L — ABNORMAL HIGH (ref 0.0–2.0)
Bicarbonate: 33.3 mmol/L — ABNORMAL HIGH (ref 20.0–28.0)
O2 Saturation: 96 %
PO2 ART: 67 mmHg — AB (ref 83.0–108.0)
Patient temperature: 98
TCO2: 34 mmol/L — ABNORMAL HIGH (ref 22–32)
pCO2 arterial: 37 mmHg (ref 32.0–48.0)
pH, Arterial: 7.561 — ABNORMAL HIGH (ref 7.350–7.450)

## 2018-11-14 LAB — CBC WITH DIFFERENTIAL/PLATELET
Abs Immature Granulocytes: 3.73 10*3/uL — ABNORMAL HIGH (ref 0.00–0.07)
Basophils Absolute: 0.1 10*3/uL (ref 0.0–0.1)
Basophils Relative: 0 %
Eosinophils Absolute: 0 10*3/uL (ref 0.0–0.5)
Eosinophils Relative: 0 %
HCT: 24.6 % — ABNORMAL LOW (ref 39.0–52.0)
Hemoglobin: 7.9 g/dL — ABNORMAL LOW (ref 13.0–17.0)
Immature Granulocytes: 7 %
Lymphocytes Relative: 4 %
Lymphs Abs: 2 10*3/uL (ref 0.7–4.0)
MCH: 28.1 pg (ref 26.0–34.0)
MCHC: 32.1 g/dL (ref 30.0–36.0)
MCV: 87.5 fL (ref 80.0–100.0)
Monocytes Absolute: 7.3 10*3/uL — ABNORMAL HIGH (ref 0.1–1.0)
Monocytes Relative: 13 %
NRBC: 0.1 % (ref 0.0–0.2)
Neutro Abs: 41.6 10*3/uL — ABNORMAL HIGH (ref 1.7–7.7)
Neutrophils Relative %: 76 %
Platelets: 216 10*3/uL (ref 150–400)
RBC: 2.81 MIL/uL — ABNORMAL LOW (ref 4.22–5.81)
RDW: 19 % — ABNORMAL HIGH (ref 11.5–15.5)
WBC: 54.7 10*3/uL (ref 4.0–10.5)

## 2018-11-14 LAB — GLUCOSE, CAPILLARY
Glucose-Capillary: 121 mg/dL — ABNORMAL HIGH (ref 70–99)
Glucose-Capillary: 126 mg/dL — ABNORMAL HIGH (ref 70–99)
Glucose-Capillary: 140 mg/dL — ABNORMAL HIGH (ref 70–99)
Glucose-Capillary: 60 mg/dL — ABNORMAL LOW (ref 70–99)
Glucose-Capillary: 60 mg/dL — ABNORMAL LOW (ref 70–99)
Glucose-Capillary: 76 mg/dL (ref 70–99)
Glucose-Capillary: 85 mg/dL (ref 70–99)
Glucose-Capillary: 95 mg/dL (ref 70–99)

## 2018-11-14 LAB — RENAL FUNCTION PANEL
Albumin: 1.2 g/dL — ABNORMAL LOW (ref 3.5–5.0)
Albumin: 1.4 g/dL — ABNORMAL LOW (ref 3.5–5.0)
Anion gap: 19 — ABNORMAL HIGH (ref 5–15)
Anion gap: 21 — ABNORMAL HIGH (ref 5–15)
BUN: 104 mg/dL — ABNORMAL HIGH (ref 8–23)
BUN: 64 mg/dL — ABNORMAL HIGH (ref 8–23)
CO2: 28 mmol/L (ref 22–32)
CO2: 29 mmol/L (ref 22–32)
Calcium: 7.9 mg/dL — ABNORMAL LOW (ref 8.9–10.3)
Calcium: 7.9 mg/dL — ABNORMAL LOW (ref 8.9–10.3)
Chloride: 90 mmol/L — ABNORMAL LOW (ref 98–111)
Chloride: 86 mmol/L — ABNORMAL LOW (ref 98–111)
Creatinine, Ser: 4.22 mg/dL — ABNORMAL HIGH (ref 0.61–1.24)
Creatinine, Ser: 2.55 mg/dL — ABNORMAL HIGH (ref 0.61–1.24)
GFR calc Af Amer: 16 mL/min — ABNORMAL LOW (ref >60)
GFR calc Af Amer: 29 mL/min — ABNORMAL LOW (ref >60)
GFR calc non Af Amer: 14 mL/min — ABNORMAL LOW (ref >60)
GFR calc non Af Amer: 25 mL/min — ABNORMAL LOW (ref >60)
GLUCOSE: 92 mg/dL (ref 70–99)
Glucose, Bld: 129 mg/dL — ABNORMAL HIGH (ref 70–99)
PHOSPHORUS: 4.6 mg/dL (ref 2.5–4.6)
Phosphorus: 3.6 mg/dL (ref 2.5–4.6)
Potassium: 4.2 mmol/L (ref 3.5–5.1)
Potassium: 4.2 mmol/L (ref 3.5–5.1)
Sodium: 137 mmol/L (ref 135–145)
Sodium: 136 mmol/L (ref 135–145)

## 2018-11-14 LAB — POCT I-STAT 3, VENOUS BLOOD GAS (G3P V)
Acid-Base Excess: 7 mmol/L — ABNORMAL HIGH (ref 0.0–2.0)
Bicarbonate: 31.1 mmol/L — ABNORMAL HIGH (ref 20.0–28.0)
O2 Saturation: 41 %
PO2 VEN: 22 mmHg — AB (ref 32.0–45.0)
Patient temperature: 98
TCO2: 32 mmol/L (ref 22–32)
pCO2, Ven: 41.7 mmHg — ABNORMAL LOW (ref 44.0–60.0)
pH, Ven: 7.479 — ABNORMAL HIGH (ref 7.250–7.430)

## 2018-11-14 LAB — MAGNESIUM: MAGNESIUM: 2.2 mg/dL (ref 1.7–2.4)

## 2018-11-14 MED ORDER — HYDROMORPHONE HCL 1 MG/ML IJ SOLN
1.0000 mg | INTRAMUSCULAR | Status: DC | PRN
  Administered 2018-11-14 – 2018-11-19 (×19): 1 mg via INTRAVENOUS
  Filled 2018-11-14 (×19): qty 1

## 2018-11-14 MED ORDER — SODIUM BICARBONATE 650 MG PO TABS: 650.0000 mg | ORAL_TABLET | Freq: Once | ORAL | Status: DC

## 2018-11-14 MED ORDER — PANCRELIPASE (LIP-PROT-AMYL) 10440-39150 UNITS PO TABS
20880.0000 [IU] | ORAL_TABLET | Freq: Once | ORAL | Status: AC
  Administered 2018-11-14: 20880 [IU]
  Filled 2018-11-14: qty 2

## 2018-11-14 NOTE — Progress Notes (Addendum)
[facility] Progress Note    Assessment/ Plan:    1. Dialysis dependent AKI from ATN, anuric; off CRRT 11/27, back on 11/30.  Paused 12/3 for line holiday, resumed 12/6 after line re-inserted (fem).  Aggressive vol removal as BP allows- at least 200 mL net neg/ hr.    2. Septic shock secondary to severe necrotizing pancreatitis; previously on meropenem and eraxis, ID following, off antibiotics for now, observing WBC and pressor requirements (off).  CT abd/ pelvis 12/2 with increased inflammation/ stranding, no pseudocyst formation (without contrast).  CT 12/5 with contrast- necrosis + more organized fluid collection   Blood cultures 11/30 negative so far.  Will need cholecystectomy when acute pancreatitis resolves. Line holiday- HD cath removed 12/3, re-inserted 12/6, likely leukomoid reaction.  On stress-dose steroids.  3. Acute hypoxic RF: extubated 11/26, reintubated 11/29, s/p trach 12/1  4. Hypocalcemia; stable on 2.5Ca bath; corrects 2/2 hypoalbuminemia  5. Anemia- transfuse prn  6. Nutrition- s/p TPN, now on TF and off insulin gtt  7. Temp HD cath present since 11/22-- removed 12/3, re-inserted 12/6.  8. Possible pericarditis: per primary  9. Dispo: remains in ICU  Subjective:    CRRT running without issue; has a lot of anxiety.  Achieving good UF.      Objective:   BP (!) 110/50   Pulse 75   Temp (!) 96.6 F (35.9 C) (Axillary)   Resp (!) 21   Ht 6' (1.829 m)   Wt 92.1 kg   SpO2 100%   BMI 27.54 kg/m   Intake/Output Summary (Last 24 hours) at 11/14/2018 0844 Last data filed at 11/14/2018 0800 Gross per 24 hour  Intake 1349.4 ml  Output 3415 ml  Net -2065.6 ml   Weight change: -0.2 kg  Physical Exam: Gen: ill-jaundiced, not responsive to stimuli HEENT + trach without drainage CVS: RRR, no rub appreciated Resp: rhonchrous bilaterally Abd: soft, distended and tender in epigastrum, not rigid  Ext:3+ diffuse anasarca, improving  Imaging: Ct  Abdomen W Contrast  Result Date: 11/12/2018 CLINICAL DATA:  Elevated liver function tests.  Known pancreatitis. EXAM: CT ABDOMEN AND PELVIS WITH CONTRAST TECHNIQUE: Multidetector CT imaging of the abdomen and pelvis was performed following the standard protocol following the bolus administration of intravenous contrast. CONTRAST:  170m OMNIPAQUE IOHEXOL 300 MG/ML  SOLN COMPARISON:  11/09/2018 FINDINGS: Lower chest: Small bilateral pleural effusions again noted with bilateral lower lobe collapse/consolidation. Small pericardial effusion is stable. Hepatobiliary: No focal abnormality within the liver parenchyma. Gallbladder is decompressed with calcified stones visible in the lumen. No intrahepatic or extrahepatic biliary dilation. Pancreas: There is extensive peripancreatic edema/inflammation. The peripancreatic fluid appears more organized than on the prior study and is starting developed a thin enhancing rim in some areas. Short segment of the pancreatic body does not enhance on today's study raising the question of pancreatic necrosis. No dilatation of the main pancreatic duct. Spleen: No splenomegaly. No focal mass lesion. Adrenals/Urinary Tract: No adrenal nodule or mass. Kidneys are unremarkable. Stomach/Bowel: NG tube tip is in the gastric antrum. The feeding tube is looped in the stomach before passing through the pylorus with distal tip positioned in the distal duodenum. No small bowel wall thickening. No small bowel dilatation. The terminal ileum is normal. The appendix is not visualized, but there is no edema or inflammation in the region of the cecum. No gross colonic mass. No colonic wall thickening. Vascular/Lymphatic: There is abdominal aortic atherosclerosis without aneurysm. Scattered small peripancreatic lymph nodes evident. No  retroperitoneal lymphadenopathy. No pelvic sidewall lymphadenopathy. Reproductive: The prostate gland and seminal vesicles have normal imaging features. Other: Moderate  amount of fluid is identified in the retroperitoneal space of the abdomen. Small volume intraperitoneal free fluid evident in the para colic gutters and pelvis. Musculoskeletal: Diffuse body wall edema evident. No worrisome lytic or sclerotic osseous abnormality. IMPRESSION: 1. Persistent peripancreatic edema/fluid. Fluid around the pancreas has become slightly more organized in the interval with a thin rim evident in some areas. 2. Short segment of pancreatic parenchyma in the body of pancreas does not enhance. Imaging features are suspicious for pancreatic necrosis. 3. No intra or extrahepatic biliary duct dilatation. 4. Cholelithiasis. 5. Small volume intraperitoneal free fluid. 6. Small bilateral pleural effusions with bilateral lower lobe collapse/consolidation. 7.  Aortic Atherosclerois (ICD10-170.0) Electronically Signed   By: Misty Stanley M.D.   On: 11/12/2018 14:40    Labs: BMET Recent Labs  Lab 11/11/18 0426 11/11/18 0428  11/11/18 1453 11/12/18 0525 11/12/18 1631 11/13/18 0437 11/13/18 1300  11/14/18 0111 11/14/18 0116 11/14/18 0500 11/14/18 0507 11/14/18 0512 11/14/18 0658 11/14/18 0702  NA 137  --    < > 138 135 134* 135 136   < > 134* 136 137 137 134* 137 135  K 3.2*  --    < > 3.3* 3.5 3.9 3.7 3.7   < > 3.9 3.9 4.2 4.0 4.2 4.0 4.1  CL 86*  --    < > 85* 86* 86* 88* 89*   < > 90* 90* 90* 89* 91* 89* 90*  CO2 35*  --   --  33* '31 27 26 24  '$ --   --   --  28  --   --   --   --   GLUCOSE 159*  --    < > 166* 168* 152* 190* 163*   < > 102* 144* 92 137* 91 133* 85  BUN 38*  --    < > 38* 70* 98* 129* 146*   < > 110* 95* 104* 89* 97* 84* 93*  CREATININE 2.41*  --    < > 2.24* 3.38* 4.37* 5.53* 5.92*   < > 4.80* 3.80* 4.22* 3.50* 4.50* 3.40* 4.10*  CALCIUM 10.3  --   --  10.7* 9.9 9.3 8.9 8.4*  --   --   --  7.9*  --   --   --   --   PHOS 2.1* 2.2*  --  2.2* 3.3 4.2 5.5* 6.5*  --   --   --  4.6  --   --   --   --    < > = values in this interval not displayed.   CBC Recent  Labs  Lab 11/09/18 0403  11/10/18 0439  11/11/18 0428  11/12/18 0525 11/13/18 1300  11/14/18 0507 11/14/18 0512 11/14/18 0658 11/14/18 0702  WBC 68.9*  --  78.1*  --  61.7*  --  54.4* 44.8*  --   --   --   --   --   NEUTROABS 46.8*  --   --   --   --   --   --   --   --   --   --   --   --   HGB 7.3*   < > 6.7*   < > 7.9*   < > 7.6* 9.1*   < > 9.5* 8.8* 8.8* 8.5*  HCT 22.6*   < > 20.8*   < >  23.7*   < > 22.8* 27.3*   < > 28.0* 26.0* 26.0* 25.0*  MCV 86.9  --  86.0  --  84.6  --  85.1 85.0  --   --   --   --   --   PLT 165  --  183  --  170  --  182 162  --   --   --   --   --    < > = values in this interval not displayed.    Medications:    . chlorhexidine gluconate (MEDLINE KIT)  15 mL Mouth Rinse BID  . feeding supplement (PRO-STAT SUGAR FREE 64)  30 mL Per Tube BID  . heparin injection (subcutaneous)  5,000 Units Subcutaneous Q8H  . hydrocortisone sodium succinate  25 mg Intravenous Daily  . HYDROmorphone  2 mg Per Tube Q4H  . insulin aspart  10 Units Subcutaneous Q4H  . insulin aspart  3-9 Units Subcutaneous Q4H  . insulin detemir  25 Units Subcutaneous Q12H  . mouth rinse  15 mL Mouth Rinse 10 times per day  . pantoprazole (PROTONIX) IV  40 mg Intravenous Daily  . sodium chloride flush  10-40 mL Intracatheter Q12H      Madelon Lips, MD 11/14/2018, 8:44 AM

## 2018-11-14 NOTE — Progress Notes (Signed)
Upon assessment, the patient had been changed to CPAP/PS of 10/5 by Dr. Valeta Harms. The patient is tolerating this well. Will continue to monitor patient.

## 2018-11-14 NOTE — Progress Notes (Addendum)
NAME:  Derek Riley, MRN:  [id number], DOB:  1951/11/01, LOS: 86 ADMISSION DATE:  11/06/2018, CONSULTATION DATE:  11/22 REFERRING MD:  11/22, CHIEF COMPLAINT:  Worsening metabolic acidosis and renal failure w/ clinical decline  Brief History   67 year old male admitted 11/20 w/ gallstone pancreatitis. PCCM consulted 11/22 for progressive metabolic acidosis, acute renal failure and persistent clinical decline.   Past Medical History  Diabetes, gallstones, hypertension.  Significant Hospital Events   11/20 transferred to [facility] from [place] w/ acute gallstone pancreatitis. GI & surgicals services consulted. Started On supportive IVFs, empiric zosyn and analgesic support.  Surgical service is planning on cholecystectomy once pancreatitis improved.  Initial lipase on arrival to [place], creatinine 1.72 11/21: MRCP 11/21 showing extensive pancreatitis, possible hemorrhagic peripancreatic fluid no choledocholithiasis, lipase 3361, creatinine up to 4.91, anion gap 13.  No role for ERCP continued supportive care 11/22: Creatinine up to 6.95, gap now 16, progressive diffuse mottling, worse pain, critical care consulted for shock, and progressive multiorgan failure, moving to intensive care for CRRT as well as hemodynamic support. 3.7 liters + since admit, developed worsening resp failure requiring intubation shortly after arrival. Possible aspiration during intubation  11/26 started on meropenem (d/t rising CBC) CRRT stopped 11/27 extubated, pressors off.  11/28 look weaker. WIll probably need re-intubation. 11/29 reintubated for increasing WOB, likely aspiration even overnight.  11/30: Remains intubated.  White blood cell count now up to 62,000.  CRRT resumed 12/1: No significant change.  Ongoing cuff leak opting for early tracheostomy versus endotracheal tube change.  12/2: tolerating PS wean at 10/5, CT of the abdomen/pelvis ordered 12/6: Initiation of CRRT to remove significant  cumulative fluid balance.  Consults:  11/20 GI and surgical services 11/22 critical care  Procedures:  11/22 Foley catheter 11/22 right IJ CVL (HD)>>> 11/22 OETT 11/22>>>12/1 Trach 12/1 (JY)>>>  Significant Diagnostic Tests:  MRCP 11/21: Acute pancreatitis with extensive pancreatic and periPancreatic inflammatory changes, including proteinaceous and/or hemorrhagic peri-pancreatic fluid.  No choledocholithiasis or biliary tract obstruction.  Small volume of ascites, trace bilateral pleural effusions.  Bilateral basilar volume loss  Micro Data:  Cultures negative  Antimicrobials:  Zosyn 11/20>>>11/26 Meropenem 11/26>>> 11/11/2018 11/11/2018 all antibiotics stopped by ID  Interim history/subjective:  Required institution of low-dose vasopressor to maintain blood pressure overnight while on CRRT.  Was successful moving greater than 2 L yesterday on CRRT.  Objective   Blood pressure (!) 93/59, pulse 79, temperature 98.7 F (37.1 C), temperature source Axillary, resp. rate (!) 22, height 6' (1.829 m), weight 92.1 kg, SpO2 96 %.    Vent Mode: PRVC FiO2 (%):  [30 %] 30 % Set Rate:  [18 bmp] 18 bmp Vt Set:  [620 mL] 620 mL PEEP:  [5 cmH20] 5 cmH20 Plateau Pressure:  [12 cmH20-24 cmH20] 19 cmH20   Intake/Output Summary (Last 24 hours) at 11/14/2018 1207 Last data filed at 11/14/2018 1100 Gross per 24 hour  Intake 1522.96 ml  Output 3561 ml  Net -2038.04 ml   Filed Weights   11/12/18 0500 11/13/18 0433 11/14/18 0500  Weight: 92.1 kg 92.3 kg 92.1 kg   Examination: General appearance: 67 y.o., male, chronically ill-appearing on mechanical ventilation, tracheostomy Eyes: anicteric sclerae, pink appropriately HENT: NCAT; oropharynx, MMM,  Neck: Trachea midline; tracheostomy tube in place Lungs: To auscultation bilaterally, bilateral ventilated breath sounds CV: RRR, S1, S2, no MRGs  Abdomen: Soft, non-tender; non-distended, BS present  Extremities: Significant 4+ edema in the  flanks, and bilateral upper  and lower extremities Skin: Normal temperature, turgor and texture; no rash Psych: No evidence of active delirium Neuro: Alert alert to person, no focal deficit can move all 4 extremities  Resolved Hospital Problem list     Assessment & Plan:   Acute gallstone pancreatitis development of pancreatic necrosis no obvious abscess or cystic formation. -No evidence of choledocholelithiasis on MRCP Plan Needs continued removal of positive fluid balance with CRRT. We will attempt to increase feeding. Overall this will take a long time to heal.  Abdominal pain secondary to above Scheduled Dilaudid, nausea vomiting unable to tolerate some p.o. overnight. We will give IV pushes PRN. New orders given for 1 mg every 2 hours Dilaudid.  Ileus, resolved, bowel movement yesterday 11/13/2018  Ongoing leukocytosis in setting of pancreatic inflammation Plan Repeat CBC pending  Acute hypoxic respiratory failure requiring mechanical ventilation in the setting of acute aspiration event 11/29, progressive atelectasis, and decreased abdominal compliance from pain and impaired cough status post tracheostomy currently requiring ventilatory support. Plan Will likely need prolonged ventilatory wean Probable candidate for LTAC vent facility  Severe deconditioning and protein calorie malnutrition Plan Tube feeding again stopped due to vomiting. Will restart low rate and increase slowly  Acute kidney injury with baseline creatinine 1.  This was hemodynamically mediated plus/minus contrast-induced nephropathy Plan Continue CRRT management per nephrology  Ongoing hypotension, likely distributive and medication effect as well as pull from CRRT. Likely related to pull from CRRT Requiring low-dose norepinephrine Titrate norepinephrine to maintain mean arterial pressure greater than 60 Patient does have low diastolic bp  Hyperglycemia Plan Subcu insulin with CBGs every 4 Would  like to avoid insulin drip  Elevated cumulative fluid balance and electrolyte imbalance: Hyperkalemia Space with significant edema Plan We will continued electrolyte management with CRRT Following CRRT protocol Appreciate nephrology recommendations Continue negative fluid balance attempt.  Bradycardic event, ST elevation Will continue to observe. I suspect this was related to sedation as well as metabolic derangements.  These are all improving. Discussed with cardiology yesterday.   This patient is critically ill with multiple organ system failure; which, requires frequent high complexity decision making, assessment, support, evaluation, and titration of therapies. This was completed through the application of advanced monitoring technologies and extensive interpretation of multiple databases. During this encounter critical care time was devoted to patient care services described in this note for 36 minutes.   Garner Nash, DO [facility] 11/14/2018 12:08 PM  Personal pager: [phone] If unanswered, please page CCM On-call: [phone]

## 2018-11-15 ENCOUNTER — Inpatient Hospital Stay (HOSPITAL_COMMUNITY): Payer: Medicare Other

## 2018-11-15 LAB — CBC WITH DIFFERENTIAL/PLATELET
Abs Immature Granulocytes: 2.58 10*3/uL — ABNORMAL HIGH (ref 0.00–0.07)
Basophils Absolute: 0.1 10*3/uL (ref 0.0–0.1)
Basophils Relative: 0 %
Eosinophils Absolute: 0 10*3/uL (ref 0.0–0.5)
Eosinophils Relative: 0 %
HEMATOCRIT: 22.7 % — AB (ref 39.0–52.0)
Hemoglobin: 7.4 g/dL — ABNORMAL LOW (ref 13.0–17.0)
Immature Granulocytes: 6 %
Lymphocytes Relative: 4 %
Lymphs Abs: 1.9 10*3/uL (ref 0.7–4.0)
MCH: 28.8 pg (ref 26.0–34.0)
MCHC: 32.6 g/dL (ref 30.0–36.0)
MCV: 88.3 fL (ref 80.0–100.0)
Monocytes Absolute: 6.7 10*3/uL — ABNORMAL HIGH (ref 0.1–1.0)
Monocytes Relative: 15 %
Neutro Abs: 34.4 10*3/uL — ABNORMAL HIGH (ref 1.7–7.7)
Neutrophils Relative %: 75 %
Platelets: 218 10*3/uL (ref 150–400)
RBC: 2.57 MIL/uL — AB (ref 4.22–5.81)
RDW: 20.2 % — ABNORMAL HIGH (ref 11.5–15.5)
WBC: 45.7 10*3/uL — ABNORMAL HIGH (ref 4.0–10.5)
nRBC: 0 % (ref 0.0–0.2)

## 2018-11-15 LAB — POCT I-STAT 3, ART BLOOD GAS (G3+)
Acid-Base Excess: 12 mmol/L — ABNORMAL HIGH (ref 0.0–2.0)
Acid-Base Excess: 3 mmol/L — ABNORMAL HIGH (ref 0.0–2.0)
BICARBONATE: 33.6 mmol/L — AB (ref 20.0–28.0)
Bicarbonate: 25.8 mmol/L (ref 20.0–28.0)
O2 Saturation: 95 %
O2 Saturation: 99 %
PO2 ART: 65 mmHg — AB (ref 83.0–108.0)
Patient temperature: 99.89
Patient temperature: 38.3
TCO2: 35 mmol/L — ABNORMAL HIGH (ref 22–32)
TCO2: 27 mmol/L (ref 22–32)
pCO2 arterial: 33 mmHg (ref 32.0–48.0)
pCO2 arterial: 32.4 mmHg (ref 32.0–48.0)
pH, Arterial: 7.617 (ref 7.350–7.450)
pH, Arterial: 7.513 — ABNORMAL HIGH (ref 7.350–7.450)
pO2, Arterial: 110 mmHg — ABNORMAL HIGH (ref 83.0–108.0)

## 2018-11-15 LAB — POCT I-STAT, CHEM 8
BUN: 59 mg/dL — ABNORMAL HIGH (ref 8–23)
BUN: 71 mg/dL — ABNORMAL HIGH (ref 8–23)
BUN: 55 mg/dL — ABNORMAL HIGH (ref 8–23)
BUN: 66 mg/dL — ABNORMAL HIGH (ref 8–23)
BUN: 51 mg/dL — ABNORMAL HIGH (ref 8–23)
BUN: 61 mg/dL — ABNORMAL HIGH (ref 8–23)
BUN: 49 mg/dL — ABNORMAL HIGH (ref 8–23)
BUN: 57 mg/dL — ABNORMAL HIGH (ref 8–23)
BUN: 47 mg/dL — ABNORMAL HIGH (ref 8–23)
BUN: 55 mg/dL — ABNORMAL HIGH (ref 8–23)
BUN: 46 mg/dL — ABNORMAL HIGH (ref 8–23)
BUN: 55 mg/dL — AB (ref 8–23)
CALCIUM ION: 0.43 mmol/L — AB (ref 1.15–1.40)
CHLORIDE: 86 mmol/L — AB (ref 98–111)
CREATININE: 2.8 mg/dL — AB (ref 0.61–1.24)
CREATININE: 2.2 mg/dL — AB (ref 0.61–1.24)
Calcium, Ion: 0.55 mmol/L — CL (ref 1.15–1.40)
Calcium, Ion: 1.06 mmol/L — ABNORMAL LOW (ref 1.15–1.40)
Calcium, Ion: 0.45 mmol/L — CL (ref 1.15–1.40)
Calcium, Ion: 1.02 mmol/L — ABNORMAL LOW (ref 1.15–1.40)
Calcium, Ion: 1.02 mmol/L — ABNORMAL LOW (ref 1.15–1.40)
Calcium, Ion: 0.43 mmol/L — CL (ref 1.15–1.40)
Calcium, Ion: 1 mmol/L — ABNORMAL LOW (ref 1.15–1.40)
Calcium, Ion: 0.41 mmol/L — CL (ref 1.15–1.40)
Calcium, Ion: 1.01 mmol/L — ABNORMAL LOW (ref 1.15–1.40)
Calcium, Ion: 0.4 mmol/L — CL (ref 1.15–1.40)
Calcium, Ion: 1 mmol/L — ABNORMAL LOW (ref 1.15–1.40)
Chloride: 86 mmol/L — ABNORMAL LOW (ref 98–111)
Chloride: 87 mmol/L — ABNORMAL LOW (ref 98–111)
Chloride: 88 mmol/L — ABNORMAL LOW (ref 98–111)
Chloride: 89 mmol/L — ABNORMAL LOW (ref 98–111)
Chloride: 88 mmol/L — ABNORMAL LOW (ref 98–111)
Chloride: 89 mmol/L — ABNORMAL LOW (ref 98–111)
Chloride: 87 mmol/L — ABNORMAL LOW (ref 98–111)
Chloride: 88 mmol/L — ABNORMAL LOW (ref 98–111)
Chloride: 87 mmol/L — ABNORMAL LOW (ref 98–111)
Chloride: 85 mmol/L — ABNORMAL LOW (ref 98–111)
Chloride: 86 mmol/L — ABNORMAL LOW (ref 98–111)
Creatinine, Ser: 3.2 mg/dL — ABNORMAL HIGH (ref 0.61–1.24)
Creatinine, Ser: 2.5 mg/dL — ABNORMAL HIGH (ref 0.61–1.24)
Creatinine, Ser: 3.2 mg/dL — ABNORMAL HIGH (ref 0.61–1.24)
Creatinine, Ser: 2.5 mg/dL — ABNORMAL HIGH (ref 0.61–1.24)
Creatinine, Ser: 3.1 mg/dL — ABNORMAL HIGH (ref 0.61–1.24)
Creatinine, Ser: 2.2 mg/dL — ABNORMAL HIGH (ref 0.61–1.24)
Creatinine, Ser: 2.8 mg/dL — ABNORMAL HIGH (ref 0.61–1.24)
Creatinine, Ser: 2.2 mg/dL — ABNORMAL HIGH (ref 0.61–1.24)
Creatinine, Ser: 2.7 mg/dL — ABNORMAL HIGH (ref 0.61–1.24)
Creatinine, Ser: 2.7 mg/dL — ABNORMAL HIGH (ref 0.61–1.24)
GLUCOSE: 68 mg/dL — AB (ref 70–99)
GLUCOSE: 185 mg/dL — AB (ref 70–99)
Glucose, Bld: 131 mg/dL — ABNORMAL HIGH (ref 70–99)
Glucose, Bld: 142 mg/dL — ABNORMAL HIGH (ref 70–99)
Glucose, Bld: 86 mg/dL (ref 70–99)
Glucose, Bld: 124 mg/dL — ABNORMAL HIGH (ref 70–99)
Glucose, Bld: 175 mg/dL — ABNORMAL HIGH (ref 70–99)
Glucose, Bld: 153 mg/dL — ABNORMAL HIGH (ref 70–99)
Glucose, Bld: 200 mg/dL — ABNORMAL HIGH (ref 70–99)
Glucose, Bld: 226 mg/dL — ABNORMAL HIGH (ref 70–99)
Glucose, Bld: 219 mg/dL — ABNORMAL HIGH (ref 70–99)
Glucose, Bld: 251 mg/dL — ABNORMAL HIGH (ref 70–99)
HCT: 27 % — ABNORMAL LOW (ref 39.0–52.0)
HCT: 28 % — ABNORMAL LOW (ref 39.0–52.0)
HCT: 25 % — ABNORMAL LOW (ref 39.0–52.0)
HCT: 28 % — ABNORMAL LOW (ref 39.0–52.0)
HCT: 25 % — ABNORMAL LOW (ref 39.0–52.0)
HCT: 28 % — ABNORMAL LOW (ref 39.0–52.0)
HCT: 23 % — ABNORMAL LOW (ref 39.0–52.0)
HCT: 27 % — ABNORMAL LOW (ref 39.0–52.0)
HCT: 26 % — ABNORMAL LOW (ref 39.0–52.0)
HCT: 28 % — ABNORMAL LOW (ref 39.0–52.0)
HCT: 26 % — ABNORMAL LOW (ref 39.0–52.0)
HCT: 29 % — ABNORMAL LOW (ref 39.0–52.0)
Hemoglobin: 9.2 g/dL — ABNORMAL LOW (ref 13.0–17.0)
Hemoglobin: 9.5 g/dL — ABNORMAL LOW (ref 13.0–17.0)
Hemoglobin: 8.5 g/dL — ABNORMAL LOW (ref 13.0–17.0)
Hemoglobin: 9.5 g/dL — ABNORMAL LOW (ref 13.0–17.0)
Hemoglobin: 8.5 g/dL — ABNORMAL LOW (ref 13.0–17.0)
Hemoglobin: 9.5 g/dL — ABNORMAL LOW (ref 13.0–17.0)
Hemoglobin: 7.8 g/dL — ABNORMAL LOW (ref 13.0–17.0)
Hemoglobin: 9.2 g/dL — ABNORMAL LOW (ref 13.0–17.0)
Hemoglobin: 8.8 g/dL — ABNORMAL LOW (ref 13.0–17.0)
Hemoglobin: 9.5 g/dL — ABNORMAL LOW (ref 13.0–17.0)
Hemoglobin: 8.8 g/dL — ABNORMAL LOW (ref 13.0–17.0)
Hemoglobin: 9.9 g/dL — ABNORMAL LOW (ref 13.0–17.0)
Potassium: 4.3 mmol/L (ref 3.5–5.1)
Potassium: 4.6 mmol/L (ref 3.5–5.1)
Potassium: 4.5 mmol/L (ref 3.5–5.1)
Potassium: 4.7 mmol/L (ref 3.5–5.1)
Potassium: 4.4 mmol/L (ref 3.5–5.1)
Potassium: 4.6 mmol/L (ref 3.5–5.1)
Potassium: 4.4 mmol/L (ref 3.5–5.1)
Potassium: 4.6 mmol/L (ref 3.5–5.1)
Potassium: 4.5 mmol/L (ref 3.5–5.1)
Potassium: 4.6 mmol/L (ref 3.5–5.1)
Potassium: 4.3 mmol/L (ref 3.5–5.1)
Potassium: 4.6 mmol/L (ref 3.5–5.1)
SODIUM: 134 mmol/L — AB (ref 135–145)
SODIUM: 133 mmol/L — AB (ref 135–145)
Sodium: 134 mmol/L — ABNORMAL LOW (ref 135–145)
Sodium: 136 mmol/L (ref 135–145)
Sodium: 136 mmol/L (ref 135–145)
Sodium: 135 mmol/L (ref 135–145)
Sodium: 133 mmol/L — ABNORMAL LOW (ref 135–145)
Sodium: 136 mmol/L (ref 135–145)
Sodium: 133 mmol/L — ABNORMAL LOW (ref 135–145)
Sodium: 136 mmol/L (ref 135–145)
Sodium: 133 mmol/L — ABNORMAL LOW (ref 135–145)
Sodium: 136 mmol/L (ref 135–145)
TCO2: 34 mmol/L — ABNORMAL HIGH (ref 22–32)
TCO2: 38 mmol/L — ABNORMAL HIGH (ref 22–32)
TCO2: 34 mmol/L — ABNORMAL HIGH (ref 22–32)
TCO2: 35 mmol/L — ABNORMAL HIGH (ref 22–32)
TCO2: 32 mmol/L (ref 22–32)
TCO2: 34 mmol/L — AB (ref 22–32)
TCO2: 31 mmol/L (ref 22–32)
TCO2: 33 mmol/L — ABNORMAL HIGH (ref 22–32)
TCO2: 30 mmol/L (ref 22–32)
TCO2: 32 mmol/L (ref 22–32)
TCO2: 29 mmol/L (ref 22–32)
TCO2: 33 mmol/L — ABNORMAL HIGH (ref 22–32)

## 2018-11-15 LAB — MAGNESIUM: Magnesium: 1.9 mg/dL (ref 1.7–2.4)

## 2018-11-15 LAB — RENAL FUNCTION PANEL
ANION GAP: 19 — AB (ref 5–15)
Albumin: 1.3 g/dL — ABNORMAL LOW (ref 3.5–5.0)
Albumin: 2.3 g/dL — ABNORMAL LOW (ref 3.5–5.0)
Anion gap: 21 — ABNORMAL HIGH (ref 5–15)
BUN: 62 mg/dL — ABNORMAL HIGH (ref 8–23)
BUN: 51 mg/dL — AB (ref 8–23)
CALCIUM: 8.8 mg/dL — AB (ref 8.9–10.3)
CO2: 30 mmol/L (ref 22–32)
CO2: 23 mmol/L (ref 22–32)
Calcium: 8.5 mg/dL — ABNORMAL LOW (ref 8.9–10.3)
Chloride: 84 mmol/L — ABNORMAL LOW (ref 98–111)
Chloride: 92 mmol/L — ABNORMAL LOW (ref 98–111)
Creatinine, Ser: 2.84 mg/dL — ABNORMAL HIGH (ref 0.61–1.24)
Creatinine, Ser: 2.67 mg/dL — ABNORMAL HIGH (ref 0.61–1.24)
GFR calc Af Amer: 25 mL/min — ABNORMAL LOW (ref >60)
GFR calc Af Amer: 27 mL/min — ABNORMAL LOW (ref >60)
GFR calc non Af Amer: 24 mL/min — ABNORMAL LOW (ref >60)
GFR, EST NON AFRICAN AMERICAN: 22 mL/min — AB (ref >60)
GLUCOSE: 83 mg/dL (ref 70–99)
Glucose, Bld: 194 mg/dL — ABNORMAL HIGH (ref 70–99)
Phosphorus: 4.4 mg/dL (ref 2.5–4.6)
Phosphorus: 4.8 mg/dL — ABNORMAL HIGH (ref 2.5–4.6)
Potassium: 4.5 mmol/L (ref 3.5–5.1)
Potassium: 5.3 mmol/L — ABNORMAL HIGH (ref 3.5–5.1)
SODIUM: 133 mmol/L — AB (ref 135–145)
Sodium: 136 mmol/L (ref 135–145)

## 2018-11-15 LAB — GLUCOSE, CAPILLARY
GLUCOSE-CAPILLARY: 63 mg/dL — AB (ref 70–99)
GLUCOSE-CAPILLARY: 59 mg/dL — AB (ref 70–99)
Glucose-Capillary: 103 mg/dL — ABNORMAL HIGH (ref 70–99)
Glucose-Capillary: 107 mg/dL — ABNORMAL HIGH (ref 70–99)
Glucose-Capillary: 110 mg/dL — ABNORMAL HIGH (ref 70–99)
Glucose-Capillary: 127 mg/dL — ABNORMAL HIGH (ref 70–99)
Glucose-Capillary: 132 mg/dL — ABNORMAL HIGH (ref 70–99)
Glucose-Capillary: 133 mg/dL — ABNORMAL HIGH (ref 70–99)
Glucose-Capillary: 232 mg/dL — ABNORMAL HIGH (ref 70–99)
Glucose-Capillary: 36 mg/dL — CL (ref 70–99)
Glucose-Capillary: 51 mg/dL — ABNORMAL LOW (ref 70–99)
Glucose-Capillary: 54 mg/dL — ABNORMAL LOW (ref 70–99)
Glucose-Capillary: 60 mg/dL — ABNORMAL LOW (ref 70–99)
Glucose-Capillary: 61 mg/dL — ABNORMAL LOW (ref 70–99)
Glucose-Capillary: 70 mg/dL (ref 70–99)
Glucose-Capillary: 72 mg/dL (ref 70–99)
Glucose-Capillary: 83 mg/dL (ref 70–99)

## 2018-11-15 LAB — POCT ACTIVATED CLOTTING TIME
ACTIVATED CLOTTING TIME: 191 s
Activated Clotting Time: 158 seconds
Activated Clotting Time: 147 seconds
Activated Clotting Time: 175 seconds
Activated Clotting Time: 158 seconds
Activated Clotting Time: 164 seconds
Activated Clotting Time: 142 seconds
Activated Clotting Time: 191 seconds
Activated Clotting Time: 208 seconds
Activated Clotting Time: 186 seconds
Activated Clotting Time: 197 seconds

## 2018-11-15 LAB — PROTIME-INR
INR: 1.31
Prothrombin Time: 16.2 seconds — ABNORMAL HIGH (ref 11.4–15.2)

## 2018-11-15 LAB — CALCIUM, IONIZED: Calcium, Ionized, Serum: 4 mg/dL — ABNORMAL LOW (ref 4.5–5.6)

## 2018-11-15 MED ORDER — GLUCAGON HCL RDNA (DIAGNOSTIC) 1 MG IJ SOLR
1.0000 mg | Freq: Once | INTRAMUSCULAR | Status: AC
  Administered 2018-11-16: 1 mg via INTRAVENOUS
  Filled 2018-11-15: qty 1

## 2018-11-15 MED ORDER — VASOPRESSIN 20 UNIT/ML IV SOLN
0.0300 [IU]/min | INTRAVENOUS | Status: DC
  Administered 2018-11-15 – 2018-11-16 (×2): 0.03 [IU]/min via INTRAVENOUS
  Filled 2018-11-15 (×3): qty 2

## 2018-11-15 MED ORDER — SODIUM CHLORIDE 0.9 % IV SOLN
1.0000 g | Freq: Two times a day (BID) | INTRAVENOUS | Status: DC
  Administered 2018-11-15 – 2018-11-19 (×9): 1 g via INTRAVENOUS
  Filled 2018-11-15 (×10): qty 1

## 2018-11-15 MED ORDER — DEXTROSE 50 % IV SOLN
INTRAVENOUS | Status: AC
  Administered 2018-11-15: 25 mL
  Filled 2018-11-15: qty 50

## 2018-11-15 MED ORDER — DEXTROSE 50 % IV SOLN
INTRAVENOUS | Status: AC
  Administered 2018-11-15: 50 mL
  Filled 2018-11-15: qty 50

## 2018-11-15 MED ORDER — NOREPINEPHRINE 16 MG/250ML-% IV SOLN
0.0000 ug/min | INTRAVENOUS | Status: DC
  Administered 2018-11-15: 20 ug/min via INTRAVENOUS
  Administered 2018-11-16: 14 ug/min via INTRAVENOUS
  Administered 2018-11-16: 29 ug/min via INTRAVENOUS
  Administered 2018-11-17: 17 ug/min via INTRAVENOUS
  Filled 2018-11-15 (×6): qty 250

## 2018-11-15 MED ORDER — DEXTROSE 50 % IV SOLN
INTRAVENOUS | Status: AC
  Administered 2018-11-15: 25 mL via INTRAVENOUS
  Filled 2018-11-15: qty 50

## 2018-11-15 MED ORDER — THIAMINE HCL 100 MG/ML IJ SOLN
100.0000 mg | Freq: Every day | INTRAMUSCULAR | Status: DC
  Administered 2018-11-15 – 2018-11-25 (×11): 100 mg via INTRAVENOUS
  Filled 2018-11-15 (×11): qty 2

## 2018-11-15 MED ORDER — EPINEPHRINE PF 1 MG/10ML IJ SOSY
PREFILLED_SYRINGE | INTRAMUSCULAR | Status: AC
  Filled 2018-11-15: qty 10

## 2018-11-15 MED ORDER — LACTATED RINGERS IV BOLUS
500.0000 mL | Freq: Once | INTRAVENOUS | Status: AC
  Administered 2018-11-15: 500 mL via INTRAVENOUS

## 2018-11-15 MED ORDER — SODIUM CHLORIDE 0.9 % IV SOLN
INTRAVENOUS | Status: DC | PRN
  Administered 2018-11-15 – 2018-11-16 (×2): 1000 mL via INTRAVENOUS
  Administered 2018-11-21: 250 mL via INTRAVENOUS

## 2018-11-15 MED ORDER — SODIUM CHLORIDE 0.9 % IV SOLN
100.0000 mg | INTRAVENOUS | Status: DC
  Administered 2018-11-16 – 2018-11-17 (×2): 100 mg via INTRAVENOUS
  Filled 2018-11-15 (×3): qty 100

## 2018-11-15 MED ORDER — PRISMASOL BGK 4/2.5 32-4-2.5 MEQ/L IV SOLN
INTRAVENOUS | Status: DC
  Administered 2018-11-15 – 2018-11-16 (×7): via INTRAVENOUS_CENTRAL
  Filled 2018-11-15 (×10): qty 5000

## 2018-11-15 MED ORDER — HEPARIN BOLUS VIA INFUSION (CRRT)
1000.0000 [IU] | INTRAVENOUS | Status: DC | PRN
  Administered 2018-11-15 (×2): 1000 [IU] via INTRAVENOUS_CENTRAL
  Filled 2018-11-15 (×2): qty 1000

## 2018-11-15 MED ORDER — ADULT MULTIVITAMIN LIQUID CH
15.0000 mL | Freq: Every day | ORAL | Status: DC
  Administered 2018-11-15 – 2018-11-25 (×9): 15 mL via ORAL
  Filled 2018-11-15 (×12): qty 15

## 2018-11-15 MED ORDER — HEPARIN SODIUM (PORCINE) 1000 UNIT/ML DIALYSIS
1000.0000 [IU] | INTRAMUSCULAR | Status: DC | PRN
  Administered 2018-11-15 (×2): 3200 [IU] via INTRAVENOUS_CENTRAL
  Administered 2018-11-24: 3000 [IU] via INTRAVENOUS_CENTRAL
  Filled 2018-11-15 (×4): qty 6
  Filled 2018-11-15: qty 4
  Filled 2018-11-15: qty 6
  Filled 2018-11-15: qty 4

## 2018-11-15 MED ORDER — SODIUM CHLORIDE 0.9 % IV SOLN
250.0000 [IU]/h | INTRAVENOUS | Status: DC
  Administered 2018-11-15: 250 [IU]/h via INTRAVENOUS_CENTRAL
  Administered 2018-11-15: 1200 [IU]/h via INTRAVENOUS_CENTRAL
  Administered 2018-11-16: 1900 [IU]/h via INTRAVENOUS_CENTRAL
  Administered 2018-11-16: 1250 [IU]/h via INTRAVENOUS_CENTRAL
  Administered 2018-11-16: 1700 [IU]/h via INTRAVENOUS_CENTRAL
  Administered 2018-11-16: 1200 [IU]/h via INTRAVENOUS_CENTRAL
  Administered 2018-11-17 (×2): 1950 [IU]/h via INTRAVENOUS_CENTRAL
  Administered 2018-11-17: 1900 [IU]/h via INTRAVENOUS_CENTRAL
  Administered 2018-11-17: 1950 [IU]/h via INTRAVENOUS_CENTRAL
  Administered 2018-11-17: 1900 [IU]/h via INTRAVENOUS_CENTRAL
  Administered 2018-11-18: 1950 [IU]/h via INTRAVENOUS_CENTRAL
  Administered 2018-11-18: 2250 [IU]/h via INTRAVENOUS_CENTRAL
  Administered 2018-11-18: 2050 [IU]/h via INTRAVENOUS_CENTRAL
  Administered 2018-11-18: 1950 [IU]/h via INTRAVENOUS_CENTRAL
  Administered 2018-11-18: 2350 [IU]/h via INTRAVENOUS_CENTRAL
  Administered 2018-11-19: 2250 [IU]/h via INTRAVENOUS_CENTRAL
  Administered 2018-11-19: 2300 [IU]/h via INTRAVENOUS_CENTRAL
  Filled 2018-11-15 (×22): qty 2

## 2018-11-15 MED ORDER — SODIUM CHLORIDE 0.9 % IV SOLN: 1.0000 g | Freq: Three times a day (TID) | INTRAVENOUS | Status: DC

## 2018-11-15 MED ORDER — HYDROCORTISONE NA SUCCINATE PF 100 MG IJ SOLR
50.0000 mg | Freq: Once | INTRAMUSCULAR | Status: AC
  Administered 2018-11-15: 50 mg via INTRAVENOUS
  Filled 2018-11-15: qty 2

## 2018-11-15 MED ORDER — ATROPINE SULFATE 1 MG/10ML IJ SOSY
PREFILLED_SYRINGE | INTRAMUSCULAR | Status: AC
  Filled 2018-11-15: qty 10

## 2018-11-15 MED ORDER — HEPARIN SODIUM (PORCINE) 5000 UNIT/ML IJ SOLN
5000.0000 [IU] | Freq: Three times a day (TID) | INTRAMUSCULAR | Status: AC
  Administered 2018-11-15 (×2): 5000 [IU] via SUBCUTANEOUS
  Filled 2018-11-15 (×2): qty 1

## 2018-11-15 MED ORDER — FOLIC ACID 1 MG PO TABS
1.0000 mg | ORAL_TABLET | Freq: Every day | ORAL | Status: DC
  Administered 2018-11-15 – 2018-11-25 (×9): 1 mg via ORAL
  Filled 2018-11-15 (×12): qty 1

## 2018-11-15 MED ORDER — ALBUMIN HUMAN 25 % IV SOLN
50.0000 g | Freq: Once | INTRAVENOUS | Status: AC
  Administered 2018-11-15: 50 g via INTRAVENOUS
  Filled 2018-11-15: qty 150

## 2018-11-15 MED ORDER — SODIUM CHLORIDE 0.9 % IV SOLN
200.0000 mg | Freq: Once | INTRAVENOUS | Status: AC
  Administered 2018-11-15: 200 mg via INTRAVENOUS
  Filled 2018-11-15: qty 200

## 2018-11-15 MED ORDER — SODIUM CHLORIDE 0.9 % IV SOLN: 200.0000 mg | INTRAVENOUS | Status: DC

## 2018-11-15 NOTE — Progress Notes (Signed)
Hypoglycemic Event  CBG: 60  Treatment: 1/2 amp D-50-W  Symptoms: None  Follow-up CBG: [id number] CBG Result:132  Possible Reasons for Event: sepsis  Comments/MD notified: Georgia Duff

## 2018-11-15 NOTE — Progress Notes (Signed)
Hypoglycemic Event  CBG: 36  Treatment: D50 50 mL (25 gm)  Symptoms: None  Follow-up CBG: Time:2025 CBG Result:127   Possible Reasons for Event: Inadequate meal intake  Comments/MD notified:  Hayden Pedro NP  increased dextrose drip rate will recheck cbg in 1 hour     [place]

## 2018-11-15 NOTE — Progress Notes (Signed)
NAME:  CALLAGHAN LAVERDURE, MRN:  [id number], DOB:  03-30-1951, LOS: 14 ADMISSION DATE:  10/25/2018, CONSULTATION DATE:  11/22 REFERRING MD:  11/22, CHIEF COMPLAINT:  Worsening metabolic acidosis and renal failure w/ clinical decline  Brief History   67 year old male admitted 11/20 w/ gallstone pancreatitis. PCCM consulted 11/22 for progressive metabolic acidosis, acute renal failure and persistent clinical decline.   Past Medical History  Diabetes, gallstones, hypertension.  Significant Hospital Events   11/20 transferred to [facility] from [place] w/ acute gallstone pancreatitis. GI & surgicals services consulted. Started On supportive IVFs, empiric zosyn and analgesic support.  Surgical service is planning on cholecystectomy once pancreatitis improved.  Initial lipase on arrival to [place], creatinine 1.72 11/21: MRCP 11/21 showing extensive pancreatitis, possible hemorrhagic peripancreatic fluid no choledocholithiasis, lipase 3361, creatinine up to 4.91, anion gap 13.  No role for ERCP continued supportive care 11/22: Creatinine up to 6.95, gap now 16, progressive diffuse mottling, worse pain, critical care consulted for shock, and progressive multiorgan failure, moving to intensive care for CRRT as well as hemodynamic support. 3.7 liters + since admit, developed worsening resp failure requiring intubation shortly after arrival. Possible aspiration during intubation  11/26 started on meropenem (d/t rising CBC) CRRT stopped 11/27 extubated, pressors off.  11/28 look weaker. WIll probably need re-intubation. 11/29 reintubated for increasing WOB, likely aspiration even overnight.  11/30: Remains intubated.  White blood cell count now up to 62,000.  CRRT resumed 12/1: No significant change.  Ongoing cuff leak opting for early tracheostomy versus endotracheal tube change.  12/2: tolerating PS wean at 10/5, CT of the abdomen/pelvis ordered 12/6: Initiation of CRRT to remove significant  cumulative fluid balance.  Consults:  11/20 GI and surgical services 11/22 critical care  Procedures:  11/22 Foley catheter 11/22 right IJ CVL (HD)>>> 11/22 OETT 11/22>>>12/1 Trach 12/1 (JY)>>>  Significant Diagnostic Tests:  MRCP 11/21: Acute pancreatitis with extensive pancreatic and periPancreatic inflammatory changes, including proteinaceous and/or hemorrhagic peri-pancreatic fluid.  No choledocholithiasis or biliary tract obstruction.  Small volume of ascites, trace bilateral pleural effusions.  Bilateral basilar volume loss  Micro Data:  Cultures negative  Antimicrobials:  Zosyn 11/20>>>11/26 Meropenem 11/26>>> 11/11/2018 11/11/2018 all antibiotics stopped by ID  Interim history/subjective:  Only issue overnight was related to vomiting.  Tolerating weaning trial this morning.  Remains on CRRT.  Negative fluid balance.  Worked on this all day.  Core track tube is occluded.  Nursing staff attempted yesterday evening trying to do everything they could to on include the tube.  Core track team is not here today therefore we cannot replace a postpyloric feeding option.  Currently has an NG tube but with feeding through the NG he still vomits.  Objective   Blood pressure (!) 148/110, pulse 87, temperature 99.8 F (37.7 C), temperature source Axillary, resp. rate (!) 28, height 6' (1.829 m), weight 87.4 kg, SpO2 97 %. CVP:  [12 mmHg] 12 mmHg  Vent Mode: CPAP;PSV FiO2 (%):  [30 %] 30 % Set Rate:  [18 bmp] 18 bmp Vt Set:  [620 mL] 620 mL PEEP:  [5 cmH20] 5 cmH20 Pressure Support:  [10 cmH20] 10 cmH20 Plateau Pressure:  [14 cmH20-25 cmH20] 25 cmH20   Intake/Output Summary (Last 24 hours) at 11/15/2018 0826 Last data filed at 11/15/2018 0800 Gross per 24 hour  Intake 3228.71 ml  Output 6244 ml  Net -3015.29 ml   Filed Weights   11/13/18 0433 11/14/18 0500 11/15/18 0430  Weight: 92.3 kg  92.1 kg 87.4 kg    Examination: General appearance: 67 y.o., male, tracheostomy tube, on  mechanical ventilation Eyes: anicteric sclerae, moist conjunctivae; tracking HENT: NCAT; tracheostomy tube in place Neck: Trachea midline; no JVD Lungs: CTAB, no crackles, no wheeze CV: RRR, S1, S2, no MRGs  Abdomen: Distended, tense to palpation, slightly improved compared to yesterday with negative fluid balance.  Bowel sounds are present Extremities: Edema in upper extremities and lower extremities and dependent flanks improved Skin: Shiny tense lower extremities Psych: No evidence of active delirium Neuro: Alert to person, opens eyes to name, moves all 4 extremities on command    Resolved Hospital Problem list     Assessment & Plan:   Acute gallstone pancreatitis development of pancreatic necrosis no obvious abscess or cystic formation. -No evidence of choledocholelithiasis on MRCP Plan Appreciate nephrology's input regarding removal of positive fluid balance with CRRT. Patient will need continued feeding. Core track is currently occluded Patient not tolerating feeding with gastric NG. Have discussed with Dr. Kathlene Cote from interventional radiology. Plan for GJ tube placement.  Abdominal pain secondary to above IV as needed Dilaudid for pain control  Ileus, resolved, bowel movement yesterday 11/13/2018 Repeat KUB today  Ongoing leukocytosis in setting of pancreatic inflammation Plan White blood cell count stable we will follow  Acute hypoxic respiratory failure requiring mechanical ventilation in the setting of acute aspiration event 11/29, progressive atelectasis, and decreased abdominal compliance from pain and impaired cough status post tracheostomy currently requiring ventilatory support. Plan Combined respiratory metabolic alkalosis on ABG. We will ensure pain control is adequate.  Tachypnea is part of this. Also discussed with nephrology likely related to citrate toxicity. We will potentially switch to an circuit heparin.  Severe deconditioning and protein calorie  malnutrition Plan Please see discussion above regarding GJ tube placement potentially tomorrow with interventional radiology.  Acute kidney injury with baseline creatinine 1.  This was hemodynamically mediated plus/minus contrast-induced nephropathy Plan CRRT fluid removal per nephrology.  Ongoing hypotension, likely distributive and medication effect as well as pull from CRRT. Titrate norepinephrine to maintain map above 65. Requiring low-dose vasopressor this morning.  Hyperglycemia Plan Long-acting insulin and subcu coverage with every 4 CBGs.  Elevated cumulative fluid balance and electrolyte imbalance: Hyperkalemia Space with significant edema Plan Negative fluid balance with CRRT per nephrology.  Bradycardic event, ST elevation Will continue to observe. I suspect this was related to sedation as well as metabolic derangements.   Cardiology has seen no further recommendations  This patient is critically ill with multiple organ system failure; which, requires frequent high complexity decision making, assessment, support, evaluation, and titration of therapies. This was completed through the application of advanced monitoring technologies and extensive interpretation of multiple databases. During this encounter critical care time was devoted to patient care services described in this note for 42 minutes.  Care coordination discussed with nephrology Dr. Hollie Salk at bedside as well as Dr. Kathlene Cote via phone.  Garner Nash, DO [facility] 11/15/2018 8:26 AM  Personal pager: [phone] If unanswered, please page CCM On-call: [phone]

## 2018-11-15 NOTE — Plan of Care (Signed)
  Problem: Education: Goal: Knowledge of General Education information will improve Description Including pain rating scale, medication(s)/side effects and non-pharmacologic comfort measures Outcome: Not Progressing   Problem: Health Behavior/Discharge Planning: Goal: Ability to manage health-related needs will improve Outcome: Not Progressing   Problem: Clinical Measurements: Goal: Ability to maintain clinical measurements within normal limits will improve Outcome: Not Progressing Goal: Will remain free from infection Outcome: Not Progressing Goal: Diagnostic test results will improve Outcome: Not Progressing Goal: Respiratory complications will improve Outcome: Not Progressing Goal: Cardiovascular complication will be avoided Outcome: Not Progressing   Problem: Activity: Goal: Risk for activity intolerance will decrease Outcome: Not Progressing   Problem: Nutrition: Goal: Adequate nutrition will be maintained Outcome: Not Progressing   Problem: Coping: Goal: Level of anxiety will decrease Outcome: Not Progressing   Problem: Elimination: Goal: Will not experience complications related to bowel motility Outcome: Not Progressing Goal: Will not experience complications related to urinary retention Outcome: Not Progressing   Problem: Pain Managment: Goal: General experience of comfort will improve Outcome: Not Progressing   Problem: Safety: Goal: Ability to remain free from injury will improve Outcome: Not Progressing   Problem: Skin Integrity: Goal: Risk for impaired skin integrity will decrease Outcome: Not Progressing   Problem: Activity: Goal: Ability to tolerate increased activity will improve Outcome: Not Progressing   Problem: Respiratory: Goal: Ability to maintain a clear airway and adequate ventilation will improve Outcome: Not Progressing   Problem: Role Relationship: Goal: Method of communication will improve Outcome: Not Progressing   Problem:  Education: Goal: Knowledge of disease and its progression will improve Outcome: Not Progressing   Problem: Health Behavior/Discharge Planning: Goal: Ability to manage health-related needs will improve Outcome: Not Progressing   Problem: Clinical Measurements: Goal: Complications related to the disease process or treatment will be avoided or minimized Outcome: Not Progressing Goal: Dialysis access will remain free of complications Outcome: Not Progressing   Problem: Activity: Goal: Activity intolerance will improve Outcome: Not Progressing   Problem: Fluid Volume: Goal: Fluid volume balance will be maintained or improved Outcome: Not Progressing   Problem: Nutritional: Goal: Ability to make appropriate dietary choices will improve Outcome: Not Progressing   Problem: Respiratory: Goal: Respiratory symptoms related to disease process will be avoided Outcome: Not Progressing   Problem: Self-Concept: Goal: Body image disturbance will be avoided or minimized Outcome: Not Progressing   Problem: Urinary Elimination: Goal: Progression of disease will be identified and treated Outcome: Not Progressing      Generally unstable throughout my shift. Increasing pressor requirements. Remains febrile. Generally declining in condition.

## 2018-11-15 NOTE — Consult Note (Signed)
Chief Complaint: Patient was seen in consultation today for Hackett tube placement.  Referring Physician(s): Dr. June Leap  Supervising Physician: Aletta Edouard  Patient Status: [facility] - In-pt  History of Present Illness: Derek Riley is a 67 y.o. male with a past medical history significant for DM, HLD and HTN who initially presented to [facility] ED on 10/17/2018 with complaints of abdominal pain x 1 day. Work up showed severe edema of the pancreas consistent with pancreatitis. He was then transferred to [facility] for further evaluation and management. He has had a prolonged, complicated hospital course including intubation, tracheostomy, acute renal failure, progressive metabolic acidosis, necrotizing pancreatitis and several rounds of CRRT. He had a cortrak placed for nutrition however it is currently occluded and patient is not tolerating tube feedings as he continues to have persistent vomiting. Request has been made to IR for GJ tube placement for continued nutrition.  Patient laying in bed, opens eyes and tracks examiner. RN at bedside suctioning tracheostomy, patient currently hypotensive. CRRT present.   Past Medical History:  Diagnosis Date  . Diabetes mellitus without complication ([place])   . Gallstone 10/2018  . Hyperlipidemia   . Hypertension     Past Surgical History:  Procedure Laterality Date  . CIRCUMCISION    . excision of moles     face & leg  . TONSILLECTOMY      Allergies: Patient has no known allergies.  Medications: Prior to Admission medications   Medication Sig Start Date End Date Taking? Authorizing Provider  aspirin EC 81 MG tablet Take 81 mg by mouth daily.   Yes [provider]  atorvastatin (LIPITOR) 80 MG tablet Take 80 mg by mouth every evening. 10/19/18  Yes [provider]  dicyclomine (BENTYL) 10 MG capsule Take 10 mg by mouth 3 (three) times daily as needed. unk 10/27/18  Yes [provider]  ezetimibe (ZETIA) 10 MG  tablet Take 10 mg by mouth every evening. 08/13/18  Yes [provider]  finasteride (PROSCAR) 5 MG tablet Take 5 mg by mouth daily. 10/23/18  Yes [provider]  GLUCOSAMINE SULFATE PO Take 1 capsule by mouth 2 (two) times daily.   Yes [provider]  HYDROcodone-acetaminophen (NORCO/VICODIN) 5-325 MG tablet Take 1 tablet by mouth every 6 (six) hours as needed for pain. 09/15/18  Yes [provider]  lisinopril (PRINIVIL,ZESTRIL) 10 MG tablet Take 10 mg by mouth daily. 10/12/18  Yes [provider]  metFORMIN (GLUCOPHAGE) 1000 MG tablet Take 1,000 mg by mouth 2 (two) times daily. 10/15/18  Yes [provider]  Omega-3 1000 MG CAPS Take 1,000 mg by mouth 2 (two) times daily.   Yes [provider]  promethazine (PHENERGAN) 25 MG tablet Take 25 mg by mouth every 6 (six) hours as needed for nausea/vomiting. 10/27/18  Yes [provider]     Family History  Problem Relation Age of Onset  . Pancreatitis Neg Hx     Social History   Socioeconomic History  . Marital status: Single    Spouse name: Not on file  . Number of children: Not on file  . Years of education: Not on file  . Highest education level: Not on file  Occupational History  . Not on file  Social Needs  . Financial resource strain: Not on file  . Food insecurity:    Worry: Not on file    Inability: Not on file  . Transportation needs:    Medical: Not on file  Non-medical: Not on file  Tobacco Use  . Smoking status: Never Smoker  . Smokeless tobacco: Never Used  Substance and Sexual Activity  . Alcohol use: Never    Frequency: Never  . Drug use: Never  . Sexual activity: Not on file  Lifestyle  . Physical activity:    Days per week: Not on file    Minutes per session: Not on file  . Stress: Not on file  Relationships  . Social connections:    Talks on phone: Not on file    Gets together: Not on file    Attends religious service: Not on file      Active member of club or organization: Not on file    Attends meetings of clubs or organizations: Not on file    Relationship status: Not on file  Other Topics Concern  . Not on file  Social History Narrative  . Not on file     Review of Systems: A 12 point ROS discussed and pertinent positives are indicated in the HPI above.  All other systems are negative.  Review of Systems  Unable to perform ROS: Intubated    Vital Signs: BP 96/77 (BP Location: Right Arm)   Pulse 92   Temp (!) 101.1 F (38.4 C) (Axillary)   Resp (!) 27   Ht 6' (1.829 m)   Wt 192 lb 10.9 oz (87.4 kg)   SpO2 100%   BMI 26.13 kg/m   Physical Exam  Constitutional:  Patient laying in bed, RN suctioning tracheostomy. (+) rigors, jaundice  HENT:  Head: Normocephalic.  Cardiovascular: Normal rate, regular rhythm and normal heart sounds.  Pulmonary/Chest: He has no wheezes. He has no rales.  (+) tracheostomy  Abdominal: Soft. He exhibits no distension.  Multiple ecchymosis present to abdomen.   Neurological: He is alert.  Skin: Skin is warm. He is diaphoretic.  Nursing note and vitals reviewed.    MD Evaluation Airway: Other (comments) Airway comments: Tracheostomy Heart: WNL Abdomen: WNL Chest/ Lungs: WNL(ventilated) ASA  Classification: 3 Mallampati/Airway Score: (tracheostomy)   Imaging: Ct Abdomen Pelvis Wo Contrast  Result Date: 11/09/2018 CLINICAL DATA:  Abdominal pain, fever. EXAM: CT ABDOMEN AND PELVIS WITHOUT CONTRAST TECHNIQUE: Multidetector CT imaging of the abdomen and pelvis was performed following the standard protocol without IV contrast. COMPARISON:  CT scan of November 04, 2018. FINDINGS: Lower chest: Mild bilateral pleural effusions are noted with adjacent atelectasis or infiltrates. Hepatobiliary: Minimal cholelithiasis is noted. The liver is unremarkable. No biliary dilatation is noted. Pancreas: Mildly increased inflammatory changes and fluid is noted around the pancreas  consistent with acute pancreatitis. Due to the lack of intravenous contrast, pancreatic necrosis cannot be excluded. No definite pseudocyst formation is seen at this time. Spleen: Normal in size without focal abnormality. Adrenals/Urinary Tract: Adrenal glands are unremarkable. Kidneys are normal, without renal calculi, focal lesion, or hydronephrosis. Bladder is unremarkable. Stomach/Bowel: Nasogastric tube is seen in the stomach. There is no evidence of bowel obstruction. The appendix is not visualized. Wall thickening of duodenum is noted consistent with secondary inflammation. Vascular/Lymphatic: Aortic atherosclerosis. No enlarged abdominal or pelvic lymph nodes. Reproductive: Prostate is unremarkable. Other: Mild anasarca is noted. No definite hernia or ascites is noted. Musculoskeletal: No acute or significant osseous findings. IMPRESSION: Findings consistent with acute pancreatitis with increased amount of inflammatory stranding and fluid around the pancreas. No definite pseudocyst formation is seen at this time. Wall thickening of adjacent duodenum is noted consistent with secondary inflammation. Mild anasarca. Aortic Atherosclerosis (  ICD10-I70.0). Electronically Signed   By: Marijo Conception, M.D.   On: 11/09/2018 20:49   Dg Chest 1 View  Result Date: 11/07/2018 CLINICAL DATA:  Advancement of endotracheal tube. EXAM: CHEST  1 VIEW COMPARISON:  11/06/2018 FINDINGS: Endotracheal tube in satisfactory position. The remaining support apparatus is stable. Cardiomediastinal silhouette is normal. Mediastinal contours appear intact. Calcific atherosclerotic disease of the aorta. There is no evidence of pneumothorax. Right lower lobe airspace consolidation versus atelectasis. Mild atelectasis of the left lower lobe. Probably bilateral small subpulmonic effusion. Osseous structures are without acute abnormality. Soft tissues are grossly normal. IMPRESSION: Satisfactory position of the endotracheal tube. Right  lower lobe airspace consolidation versus atelectasis. Mild atelectasis of the left lower lobe. Bilateral small subpulmonic effusions. Electronically Signed   By: Fidela Salisbury M.D.   On: 11/07/2018 09:34   Dg Abd 1 View  Result Date: 11/15/2018 CLINICAL DATA:  Pancreatitis.  Sepsis. EXAM: ABDOMEN - 1 VIEW COMPARISON:  None. FINDINGS: Nasogastric tube is seen with tip in the gastric antrum. A feeding tube is seen with tip now in the proximal duodenum. Right femoral venous catheter is seen with tip overlying the right common iliac vein. The bowel gas pattern is normal. IMPRESSION: Normal bowel gas pattern. Feeding tube tip in proximal duodenum. Electronically Signed   By: Earle Gell M.D.   On: 11/15/2018 11:40   Dg Abd 1 View  Result Date: 11/08/2018 CLINICAL DATA:  Ileus/constipation. EXAM: ABDOMEN - 1 VIEW COMPARISON:  11/08/2018. FINDINGS: Enteric tube is present with tip unchanged over the right upper quadrant likely over the distal stomach. Bowel gas pattern is nonobstructive with mild fecal retention throughout the colon. Remainder of the exam is unchanged. IMPRESSION: Nonobstructive bowel gas pattern with mild fecal retention throughout the colon. Nasogastric tube unchanged. Electronically Signed   By: Marin Olp M.D.   On: 11/08/2018 15:20   Dg Abd 1 View  Result Date: 11/01/2018 CLINICAL DATA:  NG tube placement EXAM: ABDOMEN - 1 VIEW COMPARISON:  10/30/2018 FINDINGS: Enteric tube with tip in the upper mid abdomen consistent with location in the distal stomach. Gaseous distention of upper abdominal bowel, probably representing transverse colon. No change since prior study. Stool in the rectum and ascending colon. Degenerative changes in the spine. IMPRESSION: Enteric tube tip is in the upper mid abdomen consistent with location in the distal stomach. Gaseous distention of upper abdominal bowel, probably transverse colon, unchanged. Electronically Signed   By: Lucienne Capers M.D.   On:  11/01/2018 06:34   Ct Abdomen W Contrast  Result Date: 11/12/2018 CLINICAL DATA:  Elevated liver function tests.  Known pancreatitis. EXAM: CT ABDOMEN AND PELVIS WITH CONTRAST TECHNIQUE: Multidetector CT imaging of the abdomen and pelvis was performed following the standard protocol following the bolus administration of intravenous contrast. CONTRAST:  139mL OMNIPAQUE IOHEXOL 300 MG/ML  SOLN COMPARISON:  11/09/2018 FINDINGS: Lower chest: Small bilateral pleural effusions again noted with bilateral lower lobe collapse/consolidation. Small pericardial effusion is stable. Hepatobiliary: No focal abnormality within the liver parenchyma. Gallbladder is decompressed with calcified stones visible in the lumen. No intrahepatic or extrahepatic biliary dilation. Pancreas: There is extensive peripancreatic edema/inflammation. The peripancreatic fluid appears more organized than on the prior study and is starting developed a thin enhancing rim in some areas. Short segment of the pancreatic body does not enhance on today's study raising the question of pancreatic necrosis. No dilatation of the main pancreatic duct. Spleen: No splenomegaly. No focal mass lesion. Adrenals/Urinary Tract: No adrenal  nodule or mass. Kidneys are unremarkable. Stomach/Bowel: NG tube tip is in the gastric antrum. The feeding tube is looped in the stomach before passing through the pylorus with distal tip positioned in the distal duodenum. No small bowel wall thickening. No small bowel dilatation. The terminal ileum is normal. The appendix is not visualized, but there is no edema or inflammation in the region of the cecum. No gross colonic mass. No colonic wall thickening. Vascular/Lymphatic: There is abdominal aortic atherosclerosis without aneurysm. Scattered small peripancreatic lymph nodes evident. No retroperitoneal lymphadenopathy. No pelvic sidewall lymphadenopathy. Reproductive: The prostate gland and seminal vesicles have normal imaging  features. Other: Moderate amount of fluid is identified in the retroperitoneal space of the abdomen. Small volume intraperitoneal free fluid evident in the para colic gutters and pelvis. Musculoskeletal: Diffuse body wall edema evident. No worrisome lytic or sclerotic osseous abnormality. IMPRESSION: 1. Persistent peripancreatic edema/fluid. Fluid around the pancreas has become slightly more organized in the interval with a thin rim evident in some areas. 2. Short segment of pancreatic parenchyma in the body of pancreas does not enhance. Imaging features are suspicious for pancreatic necrosis. 3. No intra or extrahepatic biliary duct dilatation. 4. Cholelithiasis. 5. Small volume intraperitoneal free fluid. 6. Small bilateral pleural effusions with bilateral lower lobe collapse/consolidation. 7.  Aortic Atherosclerois (ICD10-170.0) Electronically Signed   By: Misty Stanley M.D.   On: 11/12/2018 14:40   Ct Abdomen W Contrast  Result Date: 11/04/2018 CLINICAL DATA:  Pancreatitis and worsening. Evaluate for pancreatic necrosis. EXAM: CT ABDOMEN WITH CONTRAST TECHNIQUE: Multidetector CT imaging of the abdomen was performed using the standard protocol following bolus administration of intravenous contrast. CONTRAST:  77mL OMNIPAQUE IOHEXOL 300 MG/ML  SOLN COMPARISON:  10/27/2018 FINDINGS: Lower chest: Small bilateral pleural effusions are associated with bibasilar collapse/consolidation, progressed since prior. Hepatobiliary: No focal abnormality within the liver parenchyma. Tiny calcified stones are seen in the gallbladder. No intrahepatic or extrahepatic biliary dilation. Pancreas: There is diffuse peripancreatic edema/inflammation. A short segment of pancreatic body does not enhance suggesting short segment of pancreatic necrosis. No dilatation of the main pancreatic duct. No organized intrapancreatic or peripancreatic fluid collection. Spleen: No splenomegaly. No focal mass lesion. Adrenals/Urinary Tract: No  adrenal nodule or mass. Lack of contrast excretion on renal delay imaging is compatible with the patient's history of renal dysfunction. Stomach/Bowel: NG tube tip is in the mid stomach. Stomach is decompressed. Duodenal edema/inflammation likely secondary to the pancreatitis. No small bowel or colonic dilatation within the visualized abdomen. Vascular/Lymphatic: There is abdominal aortic atherosclerosis without aneurysm. Portal vein and superior mesenteric vein are patent. The splenic vein is patent. No evidence for celiac axis or SMA complication. There is no gastrohepatic or hepatoduodenal ligament lymphadenopathy. No intraperitoneal or retroperitoneal lymphadenopathy. Other: Trace free fluid is seen in each paracolic gutter with retroperitoneal fluid evident caudal to the pancreas. Musculoskeletal: No worrisome lytic or sclerotic osseous abnormality. IMPRESSION: 1. Peripancreatic edema/inflammation compatible with pancreatitis. There is a short segment (about 14 mm in length) of nonenhancing parenchyma in the body of pancreas suggesting a small focus of pancreatic necrosis. 2. Retroperitoneal edema/fluid with trace fluid in each paracolic gutter. 3. No organized or rim enhancing intra pancreatic or peripancreatic fluid collection. Electronically Signed   By: Misty Stanley M.D.   On: 11/04/2018 17:11   Mr Abdomen Mrcp Wo Contrast  Result Date: 10/29/2018 CLINICAL DATA:  67 year old male with history of abdominal pain for 1 day. Diffuse pain. Multiple episodes of emesis. Evaluate for potential pancreatitis. EXAM:  MRI ABDOMEN WITHOUT CONTRAST  (INCLUDING MRCP) TECHNIQUE: Multiplanar multisequence MR imaging of the abdomen was performed. Heavily T2-weighted images of the biliary and pancreatic ducts were obtained, and three-dimensional MRCP images were rendered by post processing. COMPARISON:  No prior abdominal MRI. CT the abdomen and pelvis 10/27/2018. FINDINGS: Comment: Study is limited for detection and  characterization of visceral and/or vascular lesions by lack of IV gadolinium. Lower chest: Trace bilateral pleural effusions lying dependently with areas of increased signal intensity in the lung bases which could reflect there is of atelectasis or sequela of recent aspiration. Hepatobiliary: Heterogeneous loss of signal intensity throughout the hepatic parenchyma on out of phase dual echo images, indicative of hepatic steatosis. No discrete cystic or solid hepatic lesions are confidently identified on today's noncontrast examination. No intra or extrahepatic biliary ductal dilatation noted on MRCP images. Common bile duct measures 5 mm in the porta hepatis. No filling defects in the common bile duct to suggest choledocholithiasis. No gallstones. Gallbladder is grossly unremarkable in appearance. Pancreas: Diffusely increased T2 signal intensity throughout the pancreas, as well as in the adjacent peripancreatic retroperitoneum. Some of this peripancreatic fluid is also T1 hyperintense, suggesting some proteinaceous/hemorrhagic contents. No discrete pancreatic mass. No pancreatic ductal dilatation noted on MRCP images. Spleen:  Unremarkable. Adrenals/Urinary Tract: Subcentimeter T1 hypointense, T2 hyperintense lesions in the kidneys bilaterally, incompletely characterized on today's noncontrast examination, but statistically likely to represent small cysts. No hydroureteronephrosis in the visualized portions of the abdomen. Bilateral adrenal glands are normal in appearance. Stomach/Bowel: Visualized portions are unremarkable. Vascular/Lymphatic: No aneurysm identified in the visualized abdominal vasculature. No lymphadenopathy noted in the abdomen. Other: Extensive edema throughout the retroperitoneum. Small volume of ascites most evident in the pericolic gutters bilaterally. Musculoskeletal: No aggressive appearing osseous lesions are noted in the visualized portions of the skeleton. IMPRESSION: 1. Changes of acute  pancreatitis, with extensive pancreatic and peripancreatic inflammatory changes, including proteinaceous and/or hemorrhagic peripancreatic fluid. 2. No choledocholithiasis or findings of biliary tract obstruction. 3. Small volume of ascites. 4. Trace bilateral pleural effusions. 5. Increased signal intensity in the lung bases bilaterally which could reflect areas of passive subsegmental atelectasis, however, the possibility of aspiration pneumonitis should be considered. Correlation with chest radiographs is recommended. Electronically Signed   By: Vinnie Langton M.D.   On: 10/29/2018 10:46   Mr 3d Recon At Scanner  Result Date: 10/29/2018 CLINICAL DATA:  67 year old male with history of abdominal pain for 1 day. Diffuse pain. Multiple episodes of emesis. Evaluate for potential pancreatitis. EXAM: MRI ABDOMEN WITHOUT CONTRAST  (INCLUDING MRCP) TECHNIQUE: Multiplanar multisequence MR imaging of the abdomen was performed. Heavily T2-weighted images of the biliary and pancreatic ducts were obtained, and three-dimensional MRCP images were rendered by post processing. COMPARISON:  No prior abdominal MRI. CT the abdomen and pelvis 10/27/2018. FINDINGS: Comment: Study is limited for detection and characterization of visceral and/or vascular lesions by lack of IV gadolinium. Lower chest: Trace bilateral pleural effusions lying dependently with areas of increased signal intensity in the lung bases which could reflect there is of atelectasis or sequela of recent aspiration. Hepatobiliary: Heterogeneous loss of signal intensity throughout the hepatic parenchyma on out of phase dual echo images, indicative of hepatic steatosis. No discrete cystic or solid hepatic lesions are confidently identified on today's noncontrast examination. No intra or extrahepatic biliary ductal dilatation noted on MRCP images. Common bile duct measures 5 mm in the porta hepatis. No filling defects in the common bile duct to suggest  choledocholithiasis. No gallstones.  Gallbladder is grossly unremarkable in appearance. Pancreas: Diffusely increased T2 signal intensity throughout the pancreas, as well as in the adjacent peripancreatic retroperitoneum. Some of this peripancreatic fluid is also T1 hyperintense, suggesting some proteinaceous/hemorrhagic contents. No discrete pancreatic mass. No pancreatic ductal dilatation noted on MRCP images. Spleen:  Unremarkable. Adrenals/Urinary Tract: Subcentimeter T1 hypointense, T2 hyperintense lesions in the kidneys bilaterally, incompletely characterized on today's noncontrast examination, but statistically likely to represent small cysts. No hydroureteronephrosis in the visualized portions of the abdomen. Bilateral adrenal glands are normal in appearance. Stomach/Bowel: Visualized portions are unremarkable. Vascular/Lymphatic: No aneurysm identified in the visualized abdominal vasculature. No lymphadenopathy noted in the abdomen. Other: Extensive edema throughout the retroperitoneum. Small volume of ascites most evident in the pericolic gutters bilaterally. Musculoskeletal: No aggressive appearing osseous lesions are noted in the visualized portions of the skeleton. IMPRESSION: 1. Changes of acute pancreatitis, with extensive pancreatic and peripancreatic inflammatory changes, including proteinaceous and/or hemorrhagic peripancreatic fluid. 2. No choledocholithiasis or findings of biliary tract obstruction. 3. Small volume of ascites. 4. Trace bilateral pleural effusions. 5. Increased signal intensity in the lung bases bilaterally which could reflect areas of passive subsegmental atelectasis, however, the possibility of aspiration pneumonitis should be considered. Correlation with chest radiographs is recommended. Electronically Signed   By: Vinnie Langton M.D.   On: 10/29/2018 10:46   Dg Chest Port 1 View  Result Date: 11/09/2018 CLINICAL DATA:  67 year old male with ileus.  Subsequent encounter.  EXAM: PORTABLE CHEST 1 VIEW COMPARISON:  11/08/2018 chest x-ray. FINDINGS: Tracheostomy tube tip midline. Right central line tip proximal superior vena cava level. Left central line tip distal superior vena cava level. Persistent consolidation right lung base may represent infiltrate or atelectasis. Less notable findings left lung base. Pulmonary vascular congestion appears stable. Heart size within normal limits.  Calcified aorta. Nasogastric tube courses below diaphragm. Tip not imaged on present exam. No pneumothorax detected. IMPRESSION: 1. Persistent consolidation right lung base may represent infiltrate or atelectasis. Less notable findings left lung base. 2. Pulmonary vascular congestion appears stable. 3. Aortic Atherosclerosis (ICD10-I70.0). 4. No change in position of central lines or tracheostomy. Electronically Signed   By: Genia Del M.D.   On: 11/09/2018 07:02   Dg Chest Port 1 View  Result Date: 11/08/2018 CLINICAL DATA:  Tracheostomy EXAM: PORTABLE CHEST 1 VIEW COMPARISON:  Chest radiograph from one day prior. FINDINGS: Tracheostomy tube tip overlies the tracheal air column at the thoracic inlet. Enteric tube enters stomach with the tip not seen on this image. Right internal jugular central venous catheter terminates in middle third of the SVC. Left internal jugular central venous catheter terminates in the lower third of the SVC. Stable cardiomediastinal silhouette with normal heart size. No pneumothorax. No pleural effusion. No overt pulmonary edema. Hazy bibasilar lung opacities are stable. IMPRESSION: 1. Well-positioned tracheostomy tube and support structures. No pneumothorax. 2. Hazy bibasilar lung opacities, stable, which could represent atelectasis or pneumonia. Electronically Signed   By: Ilona Sorrel M.D.   On: 11/08/2018 12:56   Portable Chest X-ray  Result Date: 11/06/2018 CLINICAL DATA:  New central line placement EXAM: PORTABLE CHEST 1 VIEW COMPARISON:  11/05/2018 FINDINGS:  Right dialysis catheter is unchanged. Left central line has been placed with the tip in the SVC. No pneumothorax. Endotracheal tube is 4 cm above the carina. NG tube is in the stomach. Layering bilateral effusions and bilateral lower lobe opacities, worsening since prior study, particularly on the right. Heart is borderline in size. IMPRESSION: Left central line  tip in the SVC. No pneumothorax. Endotracheal tube 4 cm above the carina. Layering bilateral effusions and lower lobe atelectasis or infiltrates, worsening on the right since prior study. Electronically Signed   By: Rolm Baptise M.D.   On: 11/06/2018 10:20   Dg Chest Port 1 View  Result Date: 11/05/2018 CLINICAL DATA:  Respiratory insufficiency EXAM: PORTABLE CHEST 1 VIEW COMPARISON:  11/04/2018 FINDINGS: Cardiac shadow is mildly enlarged but stable. Endotracheal tube and right jugular central line are again seen and stable. Bibasilar atelectatic changes are noted as well as small effusions posteriorly. The degree of fluid has increased slightly in the interval from the prior exam. This may be positional in nature. IMPRESSION: Persistent bibasilar atelectasis/infiltrate with associated small effusions. Electronically Signed   By: Inez Catalina M.D.   On: 11/05/2018 09:08   Dg Chest Port 1 View  Result Date: 11/04/2018 CLINICAL DATA:  Abnormal breath sounds on the right. EXAM: PORTABLE CHEST 1 VIEW COMPARISON:  11/03/2018 FINDINGS: NG tube has been repositioned into the stomach. Right Vas-Cath tip remains in the SVC, unchanged. There are perihilar and lower lobe airspace opacities. No visible significant effusions or acute bony abnormality. Heart is borderline in size. IMPRESSION: Bilateral perihilar and lower lobe airspace opacities could reflect edema or infection. NG tube repositioned into the stomach. Electronically Signed   By: Rolm Baptise M.D.   On: 11/04/2018 09:12   Dg Chest Port 1 View  Result Date: 11/03/2018 CLINICAL DATA:   Aspiration of foreign body.  NG tube placement. EXAM: PORTABLE CHEST 1 VIEW COMPARISON:  Chest radiograph yesterday. FINDINGS: The enteric tube courses to the left thorax overlying the expected location of the left lower lobe bronchus, tip projecting over the left lung base. No visualized pneumothorax. Unchanged right dual lumen internal jugular catheter tip in the distal SVC. Unchanged heart size and mediastinal contours. Suspect small pleural effusions with bibasilar atelectasis. Mild vascular congestion. IMPRESSION: 1. Enteric tube suspected in the left lower lobe bronchus. Recommend removal. 2. Unchanged small pleural effusions and bibasilar atelectasis. Mild vascular congestion. These results will be called to the ordering clinician or representative by the Radiologist Assistant, and communication documented in the PACS or zVision Dashboard. Electronically Signed   By: Keith Rake M.D.   On: 11/03/2018 23:44   Dg Chest Port 1 View  Result Date: 11/02/2018 CLINICAL DATA:  Hypoxia EXAM: PORTABLE CHEST 1 VIEW COMPARISON:  November 01, 2018 FINDINGS: Endotracheal tube tip is 2.8 cm above the carina. Central catheter tip is in the superior vena cava. Nasogastric tube tip and side port are below the diaphragm. No pneumothorax. There are small pleural effusions with bibasilar atelectasis. Lungs elsewhere are clear. Heart size and pulmonary vascularity are normal. No adenopathy. No bone lesions. IMPRESSION: Tube and catheter positions as described without pneumothorax. Small pleural effusions bilaterally with bibasilar atelectasis. Stable cardiac silhouette. Electronically Signed   By: Lowella Grip III M.D.   On: 11/02/2018 07:19   Dg Chest Port 1 View  Result Date: 11/01/2018 CLINICAL DATA:  Intubation EXAM: PORTABLE CHEST 1 VIEW COMPARISON:  10/31/2018 FINDINGS: Endotracheal tube tip measures 5 cm above the carina. Enteric tube tip is below the left hemidiaphragm but off the field of view. Right  central venous catheter with tip over the low SVC region. No pneumothorax. Shallow inspiration with atelectasis in the lung bases. No blunting of costophrenic angles. No pneumothorax. Mediastinal contours appear intact. Degenerative changes in the spine. IMPRESSION: Appliances appear in satisfactory position. Shallow inspiration with atelectasis  in the lung bases. Electronically Signed   By: Lucienne Capers M.D.   On: 11/01/2018 04:36   Portable Chest Xray  Result Date: 10/31/2018 CLINICAL DATA:  Aspiration EXAM: PORTABLE CHEST 1 VIEW COMPARISON:  10/30/2018 and prior exams FINDINGS: Endotracheal tube, NG tube and RIGHT IJ central venous catheter are unchanged. Cardiomediastinal silhouette is unchanged. Slightly increased RIGHT LOWER lung opacity noted. No pneumothorax, definite effusion or acute bony abnormality. IMPRESSION: Slightly increased RIGHT LOWER lung opacity which may represent atelectasis or airspace disease. Aspiration/pneumonia is not excluded. Electronically Signed   By: Margarette Canada M.D.   On: 10/31/2018 08:23   Portable Chest X-ray  Result Date: 10/30/2018 CLINICAL DATA:  Intubation. EXAM: PORTABLE CHEST 1 VIEW COMPARISON:  07/27/2017. FINDINGS: Endotracheal tube, NG tube, right IJ line stable position. Heart size normal. Bibasilar pulmonary infiltrates. No pleural effusion or pneumothorax. IMPRESSION: 1. Endotracheal tube, NG tube, right IJ line in good anatomic position. 2.  Mild bibasilar atelectasis/infiltrates. Electronically Signed   By: Marcello Moores  Register   On: 10/30/2018 12:07   Dg Abd Portable 1v  Result Date: 11/11/2018 CLINICAL DATA:  Feeding tube placement EXAM: PORTABLE ABDOMEN - 1 VIEW COMPARISON:  CT abdomen dated 11/09/2018. Plain film abdomen dated 11/08/2018. FINDINGS: Feeding tube has advanced into the duodenum, tip likely at the third portion of the duodenum just proximal to the ligament of Treitz. No distended bowel loops appreciated in the upper abdomen. No evidence  of free intraperitoneal air IMPRESSION: Feeding tube tip at the third portion of the duodenum just proximal to the ligament of Treitz. Electronically Signed   By: Franki Cabot M.D.   On: 11/11/2018 12:19   Dg Abd Portable 1v  Result Date: 11/08/2018 CLINICAL DATA:  Ileus. EXAM: PORTABLE ABDOMEN - 1 VIEW COMPARISON:  11/04/2018 FINDINGS: Enteric tube has tip right of midline likely over the distal stomach. Bowel gas pattern is nonobstructive with mild to moderate fecal retention throughout the colon. Remainder of the exam is unchanged. IMPRESSION: Nonobstructive bowel gas pattern with mild to moderate fecal retention throughout the colon. Enteric tube with tip right of midline over the upper abdomen likely in the distal stomach. Electronically Signed   By: Marin Olp M.D.   On: 11/08/2018 15:19   Dg Abd Portable 1v  Result Date: 11/04/2018 CLINICAL DATA:  NG tube placement. EXAM: PORTABLE ABDOMEN - 1 VIEW COMPARISON:  Radiographs and CT earlier this day. FINDINGS: Tip and side port of the enteric tube below the diaphragm in the stomach. Central line tip in the distal SVC is partially included. Bilateral pleural effusions and bibasilar airspace disease. No free air. IMPRESSION: Tip and side port of the enteric tube below the diaphragm in the stomach. Electronically Signed   By: Keith Rake M.D.   On: 11/04/2018 21:06   Dg Abd Portable 1v  Result Date: 11/04/2018 CLINICAL DATA:  NG tube placement EXAM: PORTABLE ABDOMEN - 1 VIEW COMPARISON:  None. FINDINGS: The nasogastric tube tip and side port project over the stomach. Nonobstructive bowel gas pattern. IMPRESSION: Nasogastric tube tip and side port project over the stomach. Electronically Signed   By: Ulyses Jarred M.D.   On: 11/04/2018 01:18   Dg Abd Portable 1v  Result Date: 10/30/2018 CLINICAL DATA:  OG tube placement.  Ileus. EXAM: PORTABLE ABDOMEN - 1 VIEW COMPARISON:  MRI 10/29/2018. FINDINGS: NG tube noted coiled in the no free air  identified. Degenerative change thoracolumbar spine. Stomach. Distended loops of bowel noted. Follow-up abdominal series suggested to demonstrate  resolution. IMPRESSION: 1.  NG tube noted coiled stomach. 2. Distended loops of bowel noted. Follow-up abdominal series suggested to demonstrate resolution. Electronically Signed   By: Marcello Moores  Register   On: 10/30/2018 12:42   US Abdomen Limited Ruq  Result Date: 10/31/2018 CLINICAL DATA:  Pancreatitis. EXAM: ULTRASOUND ABDOMEN LIMITED RIGHT UPPER QUADRANT COMPARISON:  None. FINDINGS: Gallbladder: No gallstones or wall thickening visualized. No sonographic Murphy sign noted by sonographer. Common bile duct: Diameter: 4 mm Liver: Liver is diffusely echogenic indicating fatty infiltration. Hypoechoic focus within the RIGHT liver lobe measures 2.1 x 1.4 x 1.7 cm, without obvious correlate on recent MRI abdomen of 10/29/2018 or CT abdomen of 10/14/2018, presumed focal fatty sparing. Portal vein is patent on color Doppler imaging with normal direction of blood flow towards the liver. RIGHT pleural effusion. IMPRESSION: 1. No evidence of acute cholecystitis.  No gallstones seen. 2. Fatty infiltration of the liver. 3. Hypoechoic focus within the RIGHT liver lobe, measuring 2.1 cm, without corresponding finding on recent MRI abdomen, suspected focal fatty sparing. 4. RIGHT pleural effusion. Electronically Signed   By: Franki Cabot M.D.   On: 10/31/2018 19:37    Labs:  CBC: Recent Labs    11/12/18 0525 11/13/18 1300  11/14/18 1222  11/15/18 0319 11/15/18 0509 11/15/18 0514 11/15/18 0816  WBC 54.4* 44.8*  --  54.7*  --   --   --   --  45.7*  HGB 7.6* 9.1*   < > 7.9*   < > 9.5* 8.8* 9.9* 7.4*  HCT 22.8* 27.3*   < > 24.6*   < > 28.0* 26.0* 29.0* 22.7*  PLT 182 162  --  216  --   --   --   --  218   < > = values in this interval not displayed.    COAGS: Recent Labs    11/01/18 0014 11/02/18 0942 11/08/18 1641 11/09/18 1222 11/15/18 0931  INR 1.38  1.39  --   --  1.31  APTT  --   --  39* 33  --     BMP: Recent Labs    11/13/18 1300  11/14/18 0500  11/14/18 1626  11/15/18 0319 11/15/18 0324 11/15/18 0509 11/15/18 0514  NA 136   < > 137   < > 136   < > 136 133* 133* 136  K 3.7   < > 4.2   < > 4.2   < > 4.5 4.5 4.6 4.3  CL 89*   < > 90*   < > 86*   < > 86* 84* 86* 85*  CO2 24  --  28  --  29  --   --  30  --   --   GLUCOSE 163*   < > 92   < > 129*   < > 226* 194* 219* 251*  BUN 146*   < > 104*   < > 64*   < > 47* 62* 55* 46*  CALCIUM 8.4*  --  7.9*  --  7.9*  --   --  8.8*  --   --   CREATININE 5.92*   < > 4.22*   < > 2.55*   < > 2.20* 2.84* 2.70* 2.20*  GFRNONAA 9*  --  14*  --  25*  --   --  22*  --   --   GFRAA 10*  --  16*  --  29*  --   --  25*  --   --    < > =  values in this interval not displayed.    LIVER FUNCTION TESTS: Recent Labs    11/09/18 0403  11/11/18 1453 11/12/18 0525  11/13/18 1300 11/14/18 0500 11/14/18 1626 11/15/18 0324  BILITOT 4.0*  --  4.5* 4.4*  --  5.2*  --   --   --   AST 61*  --  168* 256*  --  110*  --   --   --   ALT 15  --  26 45*  --  38  --   --   --   ALKPHOS 175*  --  193* 129*  --  128*  --   --   --   PROT 6.3*  --  7.8 6.8  --  6.1*  --   --   --   ALBUMIN 1.2*   < > 1.3*  1.3* 1.2*   < > 1.1* 1.2* 1.4* 1.3*   < > = values in this interval not displayed.    TUMOR MARKERS: No results for input(s): AFPTM, CEA, CA199, CHROMGRNA in the last 8760 hours.  Assessment and Plan:  Patient with prolonged hospital course after presenting to [facility] ED with complaints of stomach pain which was found to be due to pancreatitis. He is now in the ICU with tracheostomy, CRRT and septic shock 2/2 necrotizing pancreatitis. Previously with cortrak however this has become occluded, he is not tolerating NG feeds and continues to have vomiting. Request was made to IR for GJ tube placement which was discussed with Dr. Kathlene Cote by Dr. Valeta Harms - Dr. Kathlene Cote agrees to procedure.  Procedure is  tentatively planned for Monday 12/9 pending patient stability. This procedure was discussed with patient's brother Derek Riley via phone who gives consent to proceed. NPO/hold tube feeds after midnight 12/9, no heparin SQ after 12/8 @ 2200 - primary team to resume after procedure if indicated.   Patient currently febrile at 101.1, hypotensive, tachypneic. WBC 45.7, hgb 7.4, plt 218, calcium 0.40, creatinine 2.20, INR 1.31. Blood cultures are currently pending.  Risks and benefits discussed with the patient's brother Derek Riley including, but not limited to the need for a barium enema during the procedure, bleeding, infection, peritonitis, or damage to adjacent structures.  All of the patient's brother's questions were answered, patient's brother is agreeable to proceed.  Consent signed and in chart.  Thank you for this interesting consult.  I greatly enjoyed meeting [place] and look forward to participating in their care.  A copy of this report was sent to the requesting provider on this date.  Electronically Signed: Joaquim Nam, PA-C 11/15/2018, 12:46 PM   I spent a total of 40 Minutes  in face to face in clinical consultation, greater than 50% of which was counseling/coordinating care for [place] tube placement.

## 2018-11-15 NOTE — Progress Notes (Signed)
[facility] Progress Note Patient Name: Derek Riley DOB: 05-29-1951 MRN: [id number]   Date of Service  11/15/2018  HPI/Events of Note  Hypoglycemia - Blood glucose = 59. Already given D50.  eICU Interventions  Will increase D10W IV infusion to 125 mL/hour.      Intervention Category Major Interventions: Other:  Lysle Dingwall 11/15/2018, 11:38 PM

## 2018-11-15 NOTE — Progress Notes (Signed)
[facility] Progress Note    Assessment/ Plan:    1. Dialysis dependent AKI from ATN, anuric; off CRRT 11/27, back on 11/30.  Paused 12/3 for line holiday, resumed 12/6 after line re-inserted (fem).  Aggressive vol removal as BP allows.  Using citrate, attention to bicarbs, Ca, ABGs.    2. Septic shock secondary to severe necrotizing pancreatitis; previously on meropenem and eraxis, ID following, off antibiotics for now, observing WBC and pressor requirements (off).  CT abd/ pelvis 12/2 with increased inflammation/ stranding, no pseudocyst formation (without contrast).  CT 12/5 with contrast- necrosis + more organized fluid collection   Blood cultures 11/30 negative so far.  Will need cholecystectomy when acute pancreatitis resolves. Line holiday- HD cath removed 12/3, re-inserted 12/6, likely leukomoid reaction.  S/p  stress-dose steroids.  3. Acute hypoxic RF: extubated 11/26, reintubated 11/29, s/p trach 12/1  4. Hypocalcemia; stable on 2.5Ca bath; corrects 2/2 hypoalbuminemia  5. Anemia- transfuse prn  6. Nutrition- s/p TPN, now trialing TF and off insulin gtt.  Unfortunately has vomiting with gastric feeding, Coretrak clogged today, will likely need G-J tube, per primary.  7. Temp HD cath present since 11/22-- removed 12/3, re-inserted 12/6.  8. Possible pericarditis: per primary- TTE with trivial pericardial effusion  9. Dispo: remains in ICU  Subjective:    Net negative 2.9L yesterday, awake today, will respond to questions   Objective:   BP (!) 148/110   Pulse 87   Temp 99.8 F (37.7 C) (Axillary)   Resp (!) 28   Ht 6' (1.829 m)   Wt 87.4 kg   SpO2 97%   BMI 26.13 kg/m   Intake/Output Summary (Last 24 hours) at 11/15/2018 1937 Last data filed at 11/15/2018 0800 Gross per 24 hour  Intake 3228.71 ml  Output 6244 ml  Net -3015.29 ml   Weight change: -4.7 kg  Physical Exam: Gen: ill-jaundiced, awakens to voice HEENT + trach without drainage CVS:  RRR, no rub appreciated Resp: rhonchrous bilaterally Abd: soft, distended and tender in epigastrum, not rigid  Ext: 3+ diffuse anasarca, improving ACCESS: R fem nontunneled HD cath  Imaging: No results found.  Labs: BMET Recent Labs  Lab 11/12/18 0525 11/12/18 1631 11/13/18 0437 11/13/18 1300  11/14/18 0500  11/14/18 1626  11/15/18 0119 11/15/18 0126 11/15/18 0314 11/15/18 0319 11/15/18 0324 11/15/18 0509 11/15/18 0514  NA 135 134* 135 136   < > 137   < > 136   < > 133* 136 133* 136 133* 133* 136  K 3.5 3.9 3.7 3.7   < > 4.2   < > 4.2   < > 4.6 4.4 4.6 4.5 4.5 4.6 4.3  CL 86* 86* 88* 89*   < > 90*   < > 86*   < > 88* 87* 87* 86* 84* 86* 85*  CO2 '31 27 26 24  '$ --  28  --  29  --   --   --   --   --  30  --   --   GLUCOSE 168* 152* 190* 163*   < > 92   < > 129*   < > 153* 200* 185* 226* 194* 219* 251*  BUN 70* 98* 129* 146*   < > 104*   < > 64*   < > 57* 49* 55* 47* 62* 55* 46*  CREATININE 3.38* 4.37* 5.53* 5.92*   < > 4.22*   < > 2.55*   < > 2.80* 2.20* 2.70* 2.20*  2.84* 2.70* 2.20*  CALCIUM 9.9 9.3 8.9 8.4*  --  7.9*  --  7.9*  --   --   --   --   --  8.8*  --   --   PHOS 3.3 4.2 5.5* 6.5*  --  4.6  --  3.6  --   --   --   --   --  4.4  --   --    < > = values in this interval not displayed.   CBC Recent Labs  Lab 11/09/18 0403  11/11/18 0428  11/12/18 0525 11/13/18 1300  11/14/18 1222  11/15/18 0314 11/15/18 0319 11/15/18 0509 11/15/18 0514  WBC 68.9*   < > 61.7*  --  54.4* 44.8*  --  54.7*  --   --   --   --   --   NEUTROABS 46.8*  --   --   --   --   --   --  41.6*  --   --   --   --   --   HGB 7.3*   < > 7.9*   < > 7.6* 9.1*   < > 7.9*   < > 8.8* 9.5* 8.8* 9.9*  HCT 22.6*   < > 23.7*   < > 22.8* 27.3*   < > 24.6*   < > 26.0* 28.0* 26.0* 29.0*  MCV 86.9   < > 84.6  --  85.1 85.0  --  87.5  --   --   --   --   --   PLT 165   < > 170  --  182 162  --  216  --   --   --   --   --    < > = values in this interval not displayed.    Medications:    .  chlorhexidine gluconate (MEDLINE KIT)  15 mL Mouth Rinse BID  . feeding supplement (PRO-STAT SUGAR FREE 64)  30 mL Per Tube BID  . folic acid  1 mg Oral Daily  . heparin injection (subcutaneous)  5,000 Units Subcutaneous Q8H  . HYDROmorphone  2 mg Per Tube Q4H  . insulin aspart  10 Units Subcutaneous Q4H  . insulin aspart  3-9 Units Subcutaneous Q4H  . insulin detemir  25 Units Subcutaneous Q12H  . mouth rinse  15 mL Mouth Rinse 10 times per day  . multivitamin  15 mL Oral Daily  . pantoprazole (PROTONIX) IV  40 mg Intravenous Daily  . sodium bicarbonate  650 mg Per Tube Once  . sodium chloride flush  10-40 mL Intracatheter Q12H  . thiamine injection  100 mg Intravenous Daily      Madelon Lips, MD 11/15/2018, 8:37 AM

## 2018-11-15 NOTE — Procedures (Signed)
Arterial Catheter Insertion Procedure Note Derek Riley [facility] [id number] 04-23-1951  Procedure: Insertion of Arterial Catheter  Indications: Blood pressure monitoring  Procedure Details Consent: Unable to obtain consent because of emergent medical necessity. Time Out: Verified patient identification, verified procedure, site/side was marked, verified correct patient position, special equipment/implants available, medications/allergies/relevent history reviewed, required imaging and test results available.  Performed  Maximum sterile technique was used performed. Skin prep: Chlorhexidine; local anesthetic administered 20 gauge catheter was inserted into right radial artery using the Seldinger technique. ULTRASOUND GUIDANCE USED: NO Evaluation Blood flow good; BP tracing good. Complications: No apparent complications.   Derek Riley 11/15/2018

## 2018-11-15 NOTE — Progress Notes (Signed)
Pharmacy Antibiotic Note  Derek Riley is a 67 y.o. male admitted on 10/30/2018 with necrotizing pancreatitis.  Pharmacy has been consulted for meropenem and anidulafungin dosing. Of note, patient completed a short course of Meropenem and anidulafungin recently which was stopped by ID.   His WBC remains elevated although trending down. He spiked a fever this AM at 101.43F and is requiring increasing vasopressor support on CRRT   Plan: Restart meropenem to 1gm IV Q12H Restart anidulafungin 200 mg x 1 dose, then 100 mg daily  Monitor clinical picture, CRRT tolerance F/U LOT  Height: 6' (182.9 cm) Weight: 192 lb 10.9 oz (87.4 kg) IBW/kg (Calculated) : 77.6  Temp (24hrs), Avg:99.4 F (37.4 C), Min:98.4 F (36.9 C), Max:101.1 F (38.4 C)  Recent Labs  Lab 11/11/18 0428  11/12/18 0525  11/13/18 1300  11/14/18 1222  11/15/18 0314 11/15/18 0319 11/15/18 0324 11/15/18 0509 11/15/18 0514 11/15/18 0816  WBC 61.7*  --  54.4*  --  44.8*  --  54.7*  --   --   --   --   --   --  45.7*  CREATININE  --    < > 3.38*   < > 5.92*   < >  --    < > 2.70* 2.20* 2.84* 2.70* 2.20*  --    < > = values in this interval not displayed.    Estimated Creatinine Clearance: 35.8 mL/min (A) (by C-G formula based on SCr of 2.2 mg/dL (H)).    No Known Allergies  Thank you for allowing pharmacy to be a part of this patient's care.  Albertina Parr, PharmD., BCPS Clinical Pharmacist Clinical phone for 11/15/18 until 3:30pm: [phone] If after 3:30pm, please refer to [facility] for unit-specific pharmacist

## 2018-11-15 NOTE — Progress Notes (Addendum)
PCCM:  Notified by nursing staff earlier in the day having to start low-dose vasopressor.  Overall continuing to be progressively hypotensive throughout the day.  Escalating doses of vasopressors first.  Now requiring high doses of norepinephrine and vasopressin.  Analysis of the arterial waveform in his newly placed arterial catheter has significant pulse pressure variation.  Patient was given dose of albumin earlier with little response.  Now give 500 cc of lactated ringer bolus.  Due to concern for intravascular volume depletion due to PPV. CRRT placed to hold even balance.  Also now with new fever 101.1 Fahrenheit.  He has been off antibiotics for several days.  White blood cell count is trending down however with progressive hypotension and shock as well as fever on CRRT feel obligated to restart antimicrobials.  BP (!) 88/46 (BP Location: Right Arm)   Pulse 87   Temp (!) 101.1 F (38.4 C) (Axillary)   Resp (!) 23   Ht 6' (1.829 m)   Wt 87.4 kg   SpO2 97%   BMI 26.13 kg/m    High risk for fungal infection due to prior prolonged TPN use.  I have started meropenem and anidulafungin.   Titrate vasopressors to maintain mean arterial pressure greater than 60.  This was discussed with nursing staff.  This patient is critically ill with multiple organ system failure; which, requires frequent high complexity decision making, assessment, support, evaluation, and titration of therapies. This was completed through the application of advanced monitoring technologies and extensive interpretation of multiple databases. During this encounter critical care time was devoted to patient care services described in this note for 46 minutes.   Garner Nash, DO [facility] 11/15/2018 3:22 PM  Personal pager: [phone] If unanswered, please page CCM On-call: [phone]

## 2018-11-16 ENCOUNTER — Inpatient Hospital Stay (HOSPITAL_COMMUNITY): Payer: Medicare Other

## 2018-11-16 LAB — BLOOD CULTURE ID PANEL (REFLEXED)
Acinetobacter baumannii: NOT DETECTED
Candida albicans: NOT DETECTED
Candida glabrata: NOT DETECTED
Candida krusei: NOT DETECTED
Candida parapsilosis: NOT DETECTED
Candida tropicalis: NOT DETECTED
ENTEROCOCCUS SPECIES: NOT DETECTED
Enterobacter cloacae complex: NOT DETECTED
Enterobacteriaceae species: NOT DETECTED
Escherichia coli: NOT DETECTED
Haemophilus influenzae: NOT DETECTED
Klebsiella oxytoca: NOT DETECTED
Klebsiella pneumoniae: NOT DETECTED
Listeria monocytogenes: NOT DETECTED
Methicillin resistance: DETECTED — AB
Neisseria meningitidis: NOT DETECTED
PROTEUS SPECIES: NOT DETECTED
Pseudomonas aeruginosa: NOT DETECTED
STREPTOCOCCUS AGALACTIAE: NOT DETECTED
Serratia marcescens: NOT DETECTED
Staphylococcus aureus (BCID): NOT DETECTED
Staphylococcus species: DETECTED — AB
Streptococcus pneumoniae: NOT DETECTED
Streptococcus pyogenes: NOT DETECTED
Streptococcus species: NOT DETECTED

## 2018-11-16 LAB — CBC WITH DIFFERENTIAL/PLATELET
Abs Immature Granulocytes: 3 10*3/uL — ABNORMAL HIGH (ref 0.00–0.07)
Band Neutrophils: 1 %
Basophils Absolute: 0 10*3/uL (ref 0.0–0.1)
Basophils Relative: 0 %
Eosinophils Absolute: 0 10*3/uL (ref 0.0–0.5)
Eosinophils Relative: 0 %
HCT: 23.6 % — ABNORMAL LOW (ref 39.0–52.0)
Hemoglobin: 7.3 g/dL — ABNORMAL LOW (ref 13.0–17.0)
Lymphocytes Relative: 4 %
Lymphs Abs: 3 10*3/uL (ref 0.7–4.0)
MCH: 28.3 pg (ref 26.0–34.0)
MCHC: 30.9 g/dL (ref 30.0–36.0)
MCV: 91.5 fL (ref 80.0–100.0)
Metamyelocytes Relative: 4 %
Monocytes Absolute: 9.1 10*3/uL — ABNORMAL HIGH (ref 0.1–1.0)
Monocytes Relative: 12 %
Neutro Abs: 60.5 10*3/uL — ABNORMAL HIGH (ref 1.7–7.7)
Neutrophils Relative %: 79 %
Platelets: 320 10*3/uL (ref 150–400)
RBC: 2.58 MIL/uL — ABNORMAL LOW (ref 4.22–5.81)
RDW: 20.6 % — AB (ref 11.5–15.5)
WBC: 75.6 10*3/uL (ref 4.0–10.5)
nRBC: 0 /100 WBC
nRBC: 0.1 % (ref 0.0–0.2)

## 2018-11-16 LAB — RENAL FUNCTION PANEL
ALBUMIN: 2.1 g/dL — AB (ref 3.5–5.0)
Albumin: 2.1 g/dL — ABNORMAL LOW (ref 3.5–5.0)
Anion gap: 17 — ABNORMAL HIGH (ref 5–15)
Anion gap: 18 — ABNORMAL HIGH (ref 5–15)
BUN: 40 mg/dL — ABNORMAL HIGH (ref 8–23)
BUN: 42 mg/dL — ABNORMAL HIGH (ref 8–23)
CALCIUM: 7.7 mg/dL — AB (ref 8.9–10.3)
CO2: 20 mmol/L — ABNORMAL LOW (ref 22–32)
CO2: 22 mmol/L (ref 22–32)
Calcium: 7.6 mg/dL — ABNORMAL LOW (ref 8.9–10.3)
Chloride: 94 mmol/L — ABNORMAL LOW (ref 98–111)
Chloride: 92 mmol/L — ABNORMAL LOW (ref 98–111)
Creatinine, Ser: 2.18 mg/dL — ABNORMAL HIGH (ref 0.61–1.24)
Creatinine, Ser: 2.06 mg/dL — ABNORMAL HIGH (ref 0.61–1.24)
GFR calc Af Amer: 35 mL/min — ABNORMAL LOW (ref >60)
GFR calc Af Amer: 38 mL/min — ABNORMAL LOW (ref >60)
GFR calc non Af Amer: 30 mL/min — ABNORMAL LOW (ref >60)
GFR calc non Af Amer: 32 mL/min — ABNORMAL LOW (ref >60)
GLUCOSE: 133 mg/dL — AB (ref 70–99)
Glucose, Bld: 61 mg/dL — ABNORMAL LOW (ref 70–99)
Phosphorus: 5.4 mg/dL — ABNORMAL HIGH (ref 2.5–4.6)
Phosphorus: 5.2 mg/dL — ABNORMAL HIGH (ref 2.5–4.6)
Potassium: 5.2 mmol/L — ABNORMAL HIGH (ref 3.5–5.1)
Potassium: 5.4 mmol/L — ABNORMAL HIGH (ref 3.5–5.1)
Sodium: 131 mmol/L — ABNORMAL LOW (ref 135–145)
Sodium: 132 mmol/L — ABNORMAL LOW (ref 135–145)

## 2018-11-16 LAB — POCT I-STAT, CHEM 8
BUN: 121 mg/dL — ABNORMAL HIGH (ref 8–23)
BUN: 133 mg/dL — ABNORMAL HIGH (ref 8–23)
Calcium, Ion: 0.55 mmol/L — CL (ref 1.15–1.40)
Calcium, Ion: 0.96 mmol/L — ABNORMAL LOW (ref 1.15–1.40)
Chloride: 89 mmol/L — ABNORMAL LOW (ref 98–111)
Chloride: 91 mmol/L — ABNORMAL LOW (ref 98–111)
Creatinine, Ser: 4.8 mg/dL — ABNORMAL HIGH (ref 0.61–1.24)
Creatinine, Ser: 5.8 mg/dL — ABNORMAL HIGH (ref 0.61–1.24)
GLUCOSE: 162 mg/dL — AB (ref 70–99)
Glucose, Bld: 183 mg/dL — ABNORMAL HIGH (ref 70–99)
HCT: 25 % — ABNORMAL LOW (ref 39.0–52.0)
HCT: 26 % — ABNORMAL LOW (ref 39.0–52.0)
Hemoglobin: 8.5 g/dL — ABNORMAL LOW (ref 13.0–17.0)
Hemoglobin: 8.8 g/dL — ABNORMAL LOW (ref 13.0–17.0)
Potassium: 3.7 mmol/L (ref 3.5–5.1)
Potassium: 3.8 mmol/L (ref 3.5–5.1)
Sodium: 133 mmol/L — ABNORMAL LOW (ref 135–145)
Sodium: 134 mmol/L — ABNORMAL LOW (ref 135–145)
TCO2: 27 mmol/L (ref 22–32)
TCO2: 28 mmol/L (ref 22–32)

## 2018-11-16 LAB — CALCIUM, IONIZED: CALCIUM, IONIZED, SERUM: 4.6 mg/dL (ref 4.5–5.6)

## 2018-11-16 LAB — POCT ACTIVATED CLOTTING TIME
ACTIVATED CLOTTING TIME: 180 s
ACTIVATED CLOTTING TIME: 175 s
ACTIVATED CLOTTING TIME: 191 s
Activated Clotting Time: 169 seconds
Activated Clotting Time: 175 seconds
Activated Clotting Time: 175 seconds
Activated Clotting Time: 180 seconds
Activated Clotting Time: 180 seconds
Activated Clotting Time: 180 seconds
Activated Clotting Time: 186 seconds
Activated Clotting Time: 186 seconds
Activated Clotting Time: 191 seconds
Activated Clotting Time: 197 seconds
Activated Clotting Time: 197 seconds
Activated Clotting Time: 202 seconds
Activated Clotting Time: 208 seconds

## 2018-11-16 LAB — COMPREHENSIVE METABOLIC PANEL
ALT: 49 U/L — ABNORMAL HIGH (ref 0–44)
AST: 168 U/L — ABNORMAL HIGH (ref 15–41)
Albumin: 2.2 g/dL — ABNORMAL LOW (ref 3.5–5.0)
Alkaline Phosphatase: 169 U/L — ABNORMAL HIGH (ref 38–126)
Anion gap: 21 — ABNORMAL HIGH (ref 5–15)
BUN: 41 mg/dL — ABNORMAL HIGH (ref 8–23)
CHLORIDE: 94 mmol/L — AB (ref 98–111)
CO2: 17 mmol/L — AB (ref 22–32)
Calcium: 7.8 mg/dL — ABNORMAL LOW (ref 8.9–10.3)
Creatinine, Ser: 2.36 mg/dL — ABNORMAL HIGH (ref 0.61–1.24)
GFR calc Af Amer: 32 mL/min — ABNORMAL LOW (ref >60)
GFR calc non Af Amer: 27 mL/min — ABNORMAL LOW (ref >60)
Glucose, Bld: 72 mg/dL (ref 70–99)
POTASSIUM: 5.7 mmol/L — AB (ref 3.5–5.1)
SODIUM: 132 mmol/L — AB (ref 135–145)
Total Bilirubin: 7 mg/dL — ABNORMAL HIGH (ref 0.3–1.2)
Total Protein: 7.5 g/dL (ref 6.5–8.1)

## 2018-11-16 LAB — PATHOLOGIST SMEAR REVIEW

## 2018-11-16 LAB — GLUCOSE, CAPILLARY
GLUCOSE-CAPILLARY: 69 mg/dL — AB (ref 70–99)
Glucose-Capillary: 102 mg/dL — ABNORMAL HIGH (ref 70–99)
Glucose-Capillary: 112 mg/dL — ABNORMAL HIGH (ref 70–99)
Glucose-Capillary: 54 mg/dL — ABNORMAL LOW (ref 70–99)
Glucose-Capillary: 70 mg/dL (ref 70–99)
Glucose-Capillary: 70 mg/dL (ref 70–99)
Glucose-Capillary: 70 mg/dL (ref 70–99)
Glucose-Capillary: 73 mg/dL (ref 70–99)
Glucose-Capillary: 93 mg/dL (ref 70–99)
Glucose-Capillary: 94 mg/dL (ref 70–99)
Glucose-Capillary: 94 mg/dL (ref 70–99)

## 2018-11-16 LAB — AMYLASE: Amylase: 118 U/L — ABNORMAL HIGH (ref 28–100)

## 2018-11-16 LAB — APTT: aPTT: 151 seconds — ABNORMAL HIGH (ref 24–36)

## 2018-11-16 LAB — LIPASE, BLOOD: LIPASE: 100 U/L — AB (ref 11–51)

## 2018-11-16 LAB — MAGNESIUM: Magnesium: 2.4 mg/dL (ref 1.7–2.4)

## 2018-11-16 LAB — PHOSPHORUS: Phosphorus: 6.1 mg/dL — ABNORMAL HIGH (ref 2.5–4.6)

## 2018-11-16 MED ORDER — DEXTROSE 10 % IV SOLN
INTRAVENOUS | Status: DC
  Administered 2018-11-16 – 2018-11-17 (×2): via INTRAVENOUS

## 2018-11-16 MED ORDER — HYDROMORPHONE HCL 2 MG PO TABS
1.0000 mg | ORAL_TABLET | ORAL | Status: DC
  Administered 2018-11-17 – 2018-11-18 (×5): 1 mg
  Filled 2018-11-16 (×5): qty 1

## 2018-11-16 MED ORDER — MUPIROCIN 2 % EX OINT
1.0000 "application " | TOPICAL_OINTMENT | Freq: Two times a day (BID) | CUTANEOUS | Status: AC
  Administered 2018-11-17 – 2018-11-21 (×9): 1 via NASAL
  Filled 2018-11-16 (×2): qty 22

## 2018-11-16 MED ORDER — DEXTROSE 50 % IV SOLN
25.0000 mL | Freq: Once | INTRAVENOUS | Status: AC
  Administered 2018-11-15 – 2018-11-16 (×2): 25 mL via INTRAVENOUS

## 2018-11-16 MED ORDER — DEXTROSE 50 % IV SOLN
INTRAVENOUS | Status: AC
  Administered 2018-11-16: 25 mL via INTRAVENOUS
  Filled 2018-11-16: qty 50

## 2018-11-16 MED ORDER — PRISMASOL BGK 0/2.5 32-2.5 MEQ/L REPLACEMENT SOLN
Status: DC
  Administered 2018-11-16 – 2018-11-18 (×14): via INTRAVENOUS_CENTRAL
  Filled 2018-11-16 (×16): qty 5000

## 2018-11-16 MED ORDER — INSULIN ASPART 100 UNIT/ML ~~LOC~~ SOLN
0.0000 [IU] | SUBCUTANEOUS | Status: DC
  Administered 2018-11-17: 2 [IU] via SUBCUTANEOUS
  Administered 2018-11-17 – 2018-11-18 (×5): 1 [IU] via SUBCUTANEOUS
  Administered 2018-11-18: 2 [IU] via SUBCUTANEOUS
  Administered 2018-11-18 (×2): 1 [IU] via SUBCUTANEOUS
  Administered 2018-11-18 – 2018-11-19 (×4): 2 [IU] via SUBCUTANEOUS
  Administered 2018-11-19: 1 [IU] via SUBCUTANEOUS
  Administered 2018-11-19 – 2018-11-21 (×10): 2 [IU] via SUBCUTANEOUS
  Administered 2018-11-21: 3 [IU] via SUBCUTANEOUS
  Administered 2018-11-21: 2 [IU] via SUBCUTANEOUS
  Administered 2018-11-21: 3 [IU] via SUBCUTANEOUS
  Administered 2018-11-21 – 2018-11-22 (×2): 2 [IU] via SUBCUTANEOUS
  Administered 2018-11-22 (×2): 1 [IU] via SUBCUTANEOUS
  Administered 2018-11-22: 2 [IU] via SUBCUTANEOUS
  Administered 2018-11-23 (×2): 1 [IU] via SUBCUTANEOUS
  Administered 2018-11-23: 2 [IU] via SUBCUTANEOUS
  Administered 2018-11-23: 1 [IU] via SUBCUTANEOUS
  Administered 2018-11-23: 2 [IU] via SUBCUTANEOUS
  Administered 2018-11-23 – 2018-11-24 (×5): 1 [IU] via SUBCUTANEOUS
  Administered 2018-11-25 (×2): 2 [IU] via SUBCUTANEOUS
  Administered 2018-11-25 – 2018-11-26 (×7): 1 [IU] via SUBCUTANEOUS

## 2018-11-16 MED ORDER — PRISMASOL BGK 4/2.5 32-4-2.5 MEQ/L IV SOLN: INTRAVENOUS | Status: DC

## 2018-11-16 MED ORDER — CHLORHEXIDINE GLUCONATE CLOTH 2 % EX PADS
6.0000 | MEDICATED_PAD | Freq: Every day | CUTANEOUS | Status: AC
  Administered 2018-11-17 – 2018-11-21 (×5): 6 via TOPICAL

## 2018-11-16 NOTE — Plan of Care (Signed)
  Problem: Education: Goal: Knowledge of General Education information will improve Description Including pain rating scale, medication(s)/side effects and non-pharmacologic comfort measures Outcome: Not Progressing   Problem: Health Behavior/Discharge Planning: Goal: Ability to manage health-related needs will improve Outcome: Not Progressing   Problem: Clinical Measurements: Goal: Ability to maintain clinical measurements within normal limits will improve Outcome: Progressing Goal: Will remain free from infection Outcome: Not Progressing Goal: Diagnostic test results will improve Outcome: Progressing Goal: Respiratory complications will improve Outcome: Progressing Goal: Cardiovascular complication will be avoided Outcome: Progressing   Problem: Activity: Goal: Risk for activity intolerance will decrease Outcome: Not Progressing   Problem: Nutrition: Goal: Adequate nutrition will be maintained Outcome: Not Progressing   Problem: Coping: Goal: Level of anxiety will decrease Outcome: Progressing   Problem: Elimination: Goal: Will not experience complications related to bowel motility Outcome: Not Progressing Goal: Will not experience complications related to urinary retention Outcome: Not Applicable   Problem: Pain Managment: Goal: General experience of comfort will improve Outcome: Progressing   Problem: Safety: Goal: Ability to remain free from injury will improve Outcome: Progressing   Problem: Skin Integrity: Goal: Risk for impaired skin integrity will decrease Outcome: Progressing   Problem: Activity: Goal: Ability to tolerate increased activity will improve Outcome: Not Progressing   Problem: Respiratory: Goal: Ability to maintain a clear airway and adequate ventilation will improve Outcome: Progressing   Problem: Role Relationship: Goal: Method of communication will improve Outcome: Not Progressing   Problem: Education: Goal: Knowledge of  disease and its progression will improve Outcome: Not Progressing   Problem: Health Behavior/Discharge Planning: Goal: Ability to manage health-related needs will improve Outcome: Not Progressing   Problem: Clinical Measurements: Goal: Complications related to the disease process or treatment will be avoided or minimized Outcome: Progressing Goal: Dialysis access will remain free of complications Outcome: Progressing   Problem: Activity: Goal: Activity intolerance will improve Outcome: Not Progressing   Problem: Fluid Volume: Goal: Fluid volume balance will be maintained or improved Outcome: Progressing   Problem: Nutritional: Goal: Ability to make appropriate dietary choices will improve Outcome: Not Progressing   Problem: Respiratory: Goal: Respiratory symptoms related to disease process will be avoided Outcome: Progressing   Problem: Self-Concept: Goal: Body image disturbance will be avoided or minimized Outcome: Not Progressing   Problem: Urinary Elimination: Goal: Progression of disease will be identified and treated Outcome: Not Applicable    Patient more stable now back on antibiotics. Receiving systemic heparin for CRRT. Pressor requirement slowly decreasing. Able to remove small amounts fluid via CRRT. Still unable to feed. Cortrak still clogged. IR procedure pending for today.

## 2018-11-16 NOTE — Progress Notes (Signed)
Cortrak remains clogged. No one available in Amion to page from the Index team. Will leave tube in place and out of service until instructed otherwise.

## 2018-11-16 NOTE — Progress Notes (Signed)
PHARMACY - PHYSICIAN COMMUNICATION CRITICAL VALUE ALERT - BLOOD CULTURE IDENTIFICATION (BCID)  Derek Riley is an 67 y.o. male  with acute gallstone pancreatitis development of pancreatic necrosis  On antibiotics and also noted on pressors and CRRT.  Assessment:  acute gallstone pancreatitis w/ evolution of necrosis. Blood cultures show GPC/clusters 1/4. BCID with staph species and MEC A positive. Likely contaminant  Name of physician (or Provider) Contacted: Dr. Lamonte Sakai  Current antibiotics: Eraxis, meropenem  Changes to prescribed antibiotics recommended:  Patient is on recommended antibiotics - No changes needed  Results for orders placed or performed during the hospital encounter of 11/01/2018  Blood Culture ID Panel (Reflexed) (Collected: 11/15/2018  1:12 PM)  Result Value Ref Range   Enterococcus species NOT DETECTED NOT DETECTED   Listeria monocytogenes NOT DETECTED NOT DETECTED   Staphylococcus species DETECTED (A) NOT DETECTED   Staphylococcus aureus (BCID) NOT DETECTED NOT DETECTED   Methicillin resistance DETECTED (A) NOT DETECTED   Streptococcus species NOT DETECTED NOT DETECTED   Streptococcus agalactiae NOT DETECTED NOT DETECTED   Streptococcus pneumoniae NOT DETECTED NOT DETECTED   Streptococcus pyogenes NOT DETECTED NOT DETECTED   Acinetobacter baumannii NOT DETECTED NOT DETECTED   Enterobacteriaceae species NOT DETECTED NOT DETECTED   Enterobacter cloacae complex NOT DETECTED NOT DETECTED   Escherichia coli NOT DETECTED NOT DETECTED   Klebsiella oxytoca NOT DETECTED NOT DETECTED   Klebsiella pneumoniae NOT DETECTED NOT DETECTED   Proteus species NOT DETECTED NOT DETECTED   Serratia marcescens NOT DETECTED NOT DETECTED   Haemophilus influenzae NOT DETECTED NOT DETECTED   Neisseria meningitidis NOT DETECTED NOT DETECTED   Pseudomonas aeruginosa NOT DETECTED NOT DETECTED   Candida albicans NOT DETECTED NOT DETECTED   Candida glabrata NOT DETECTED NOT DETECTED   Candida krusei NOT DETECTED NOT DETECTED   Candida parapsilosis NOT DETECTED NOT DETECTED   Candida tropicalis NOT DETECTED NOT DETECTED    Dareen Piano 11/16/2018  5:48 PM

## 2018-11-16 NOTE — Progress Notes (Signed)
[facility] Progress Note Patient Name: Derek Riley DOB: 1951/07/13 MRN: [id number]   Date of Service  11/16/2018  HPI/Events of Note  Hypoglycemia - Blood glucose = 61. Already given D50.  eICU Interventions  Will order: 1. D10W to run IV at 40 mL/hour.      Intervention Category Major Interventions: Other:  Lysle Dingwall 11/16/2018, 8:17 PM

## 2018-11-16 NOTE — Progress Notes (Signed)
SLP Cancellation Note  Patient Details Name: SIRAJ DERMODY MRN: [id number] DOB: [birth year]   Cancelled treatment:       Reason Eval/Treat Not Completed: Patient not medically ready. Reviewed chart and discussed patient with RN. Patient remains on full vent support which does not limit ability to attempt in-line PMV evaluation, however per RN, patient remains lethargic without attempts to communicate verbally despite ability to follow some basic commands. Will continue to f/u.   Gabriel Rainwater MA, CCC-SLP     Edgel Degnan Meryl 11/16/2018, 9:58 AM

## 2018-11-16 NOTE — Progress Notes (Signed)
Inpatient Diabetes Program Recommendations  AACE/ADA: New Consensus Statement on Inpatient Glycemic Control (2015)  Target Ranges:  Prepandial:   less than 140 mg/dL      Peak postprandial:   less than 180 mg/dL (1-2 hours)      Critically ill patients:  140 - 180 mg/dL   Lab Results  Component Value Date   GLUCAP 112 (H) 11/16/2018   HGBA1C 8.0 (H) 10/25/2018    Review of Glycemic Control Results for CORDERO, SURETTE (MRN [id number]) as of 11/16/2018 11:32  Ref. Range 11/16/2018 00:35 11/16/2018 03:07 11/16/2018 04:09 11/16/2018 07:28 11/16/2018 11:16  Glucose-Capillary Latest Ref Range: 70 - 99 mg/dL 70 70 70 94 112 (H)    Diabetes history: DM 2 Outpatient Diabetes medications: Metformin 1000 mg bid Current orders for Inpatient glycemic control:  Novolog 3-6-9 q 4 hours Inpatient Diabetes Program Recommendations: Please consider reducing Novolog correction to sensitive (1-2-3) Novolog every 4 hours.  Thanks,  Adah Perl, RN, BC-ADM Inpatient Diabetes Coordinator Pager [phone] (8a-5p)

## 2018-11-16 NOTE — Progress Notes (Signed)
NAME:  Derek Riley, MRN:  [id number], DOB:  Feb 10, 1951, LOS: 63 ADMISSION DATE:  10/26/2018, CONSULTATION DATE:  11/22 REFERRING MD:  11/22, CHIEF COMPLAINT:  Worsening metabolic acidosis and renal failure w/ clinical decline  Brief History   67 year old male admitted 11/20 w/ gallstone pancreatitis. PCCM consulted 11/22 for progressive metabolic acidosis, acute renal failure and persistent clinical decline.   Past Medical History  Diabetes, gallstones, hypertension.  Significant Hospital Events   11/20 transferred to [facility] from [place] w/ acute gallstone pancreatitis. GI & surgicals services consulted. Started On supportive IVFs, empiric zosyn and analgesic support.  Surgical service is planning on cholecystectomy once pancreatitis improved.  Initial lipase on arrival to [place], creatinine 1.72 11/21: MRCP 11/21 showing extensive pancreatitis, possible hemorrhagic peripancreatic fluid no choledocholithiasis, lipase 3361, creatinine up to 4.91, anion gap 13.  No role for ERCP continued supportive care 11/22: Creatinine up to 6.95, gap now 16, progressive diffuse mottling, worse pain, critical care consulted for shock, and progressive multiorgan failure, moving to intensive care for CRRT as well as hemodynamic support. 3.7 liters + since admit, developed worsening resp failure requiring intubation shortly after arrival. Possible aspiration during intubation  11/26 started on meropenem (d/t rising CBC) CRRT stopped 11/27 extubated, pressors off.  11/28 look weaker. WIll probably need re-intubation. 11/29 reintubated for increasing WOB, likely aspiration even overnight.  11/30: Remains intubated.  White blood cell count now up to 62,000.  CRRT resumed 12/1: No significant change.  Ongoing cuff leak opting for early tracheostomy versus endotracheal tube change.  12/2: tolerating PS wean at 10/5, CT of the abdomen/pelvis ordered 12/6: Initiation of CRRT to remove significant  cumulative fluid balance.  Consults:  11/20 GI and surgical services 11/22 critical care  Procedures:  11/22 Foley catheter 11/22 right IJ CVL (HD)>>> 11/22 OETT 11/22>>>12/1 Trach 12/1 (JY)>>>  Significant Diagnostic Tests:  MRCP 11/21: Acute pancreatitis with extensive pancreatic and periPancreatic inflammatory changes, including proteinaceous and/or hemorrhagic peri-pancreatic fluid.  No choledocholithiasis or biliary tract obstruction.  Small volume of ascites, trace bilateral pleural effusions.  Bilateral basilar volume loss  CT abdomen/pelvis 10/9>> persistent peripancreatic edema and fluid, slightly more organized, small area of apparent pancreatic necrosis, no intra-or extrahepatic biliary ductal dilatation, small bilateral pleural effusions with lower lobe atelectatic change  Micro Data:  Cultures negative  Antimicrobials:  Zosyn 11/20>>>11/26 Meropenem 11/26>>> 11/11/2018 Anidulafungin 12/8 Meropenem 12/8  Interim history/subjective:  Hypotensive 12/8, antibiotics added back as above Tolerated some pressure support ventilation this morning, now currently back to [facility] CVVH changed to even balance   Objective   Blood pressure 100/81, pulse 74, temperature 99.3 F (37.4 C), temperature source Axillary, resp. rate (!) 27, height 6' (1.829 m), weight 87.6 kg, SpO2 100 %. CVP:  [15 mmHg-20 mmHg] 17 mmHg  Vent Mode: PRVC FiO2 (%):  [30 %-40 %] 40 % Set Rate:  [18 bmp] 18 bmp Vt Set:  [620 mL] 620 mL PEEP:  [5 cmH20] 5 cmH20 Pressure Support:  [5 cmH20-15 cmH20] 15 cmH20 Plateau Pressure:  [11 cmH20-25 cmH20] 11 cmH20   Intake/Output Summary (Last 24 hours) at 11/16/2018 1405 Last data filed at 11/16/2018 1315 Gross per 24 hour  Intake 4586.07 ml  Output 5136 ml  Net -549.93 ml   Filed Weights   11/14/18 0500 11/15/18 0430 11/16/18 0417  Weight: 92.1 kg 87.4 kg 87.6 kg    Examination: General appearance: Ill-appearing man, mechanically ventilated Eyes: Mild  scleral icterus, pupils equal and react  HENT: Endotracheal tube in place, postpyloric small bore and large bore enteral tubes in place Neck: No JVD Lungs: C distant, clear bilaterally, no crackles, no wheeze CV: Regular, borderline tachycardic, no murmur Abdomen: Distended, tense, positive bowel sounds Extremities: 1-2+ edema Skin: No rash Neuro: Wakes to voice, grimace with pain.  Has followed some commands intermittently    Resolved Hospital Problem list     Assessment & Plan:   Acute gallstone pancreatitis development of pancreatic necrosis no obvious abscess or cystic formation. -No evidence of choledocholelithiasis on MRCP Plan Back on antibiotics.  We will need to consider repeat pancreatic imaging depending on the course, pressor needs, fever curve. Currently has a postpyloric to small bore tube, gastric tube with significant input on suctioning.  We will try to have a GJ tube placed by IR when the patient is stable to do so.  Shock.  Suspect distributive, in part related to volume removal with CVVH. Wean pressors as able Try to keep even fluid balance with CVVH  Abdominal pain secondary to above Dilaudid is ordered, minimize if possible  Ileus Continue bowel regimen  Ongoing leukocytosis in setting of pancreatic inflammation Plan Worse over the last 48 hours.  Antibiotics added back.  No current evidence for colitis clinically or by CT abd (high WBC concerning for possible C. difficile)  Acute hypoxic respiratory failure requiring mechanical ventilation in the setting of acute aspiration event 11/29, progressive atelectasis, and decreased abdominal compliance from pain and impaired cough status post tracheostomy currently requiring ventilatory support. Plan Push pressure support ventilation as he can tolerate.  Possibly to trach collar soon?  His metabolic status, overall deconditioning are barriers here  Severe deconditioning and protein calorie  malnutrition Plan Goal GJ tube placement when stable to do so by IR, need to work on nutritional status  Acute kidney injury with baseline creatinine 1.  This was hemodynamically mediated plus/minus contrast-induced nephropathy Plan Appreciate nephrology assistance. Continue CVVHD, question whether he will have a renal recovery.  Looking unlikely. Goal even fluid balance at this point  Hyperglycemia Plan Insulin coverage as ordered  Elevated cumulative fluid balance and electrolyte imbalance: Hyperkalemia Plan Now improved, agree with even balance  Case discussed with the patient's brother at bedside today 12/9.  I explained his prognosis, the acute issues that are impacting his ability to progress, get back to his usual quality of life.  They are going to think about his goals and we will revisit.  I suspect that they may want to de-escalate his care.  This patient is critically ill with multiple organ system failure; which, requires frequent high complexity decision making, assessment, support, evaluation, and titration of therapies. This was completed through the application of advanced monitoring technologies and extensive interpretation of multiple databases. During this encounter critical care time was devoted to patient care services described in this note for 34 minutes.     Baltazar Apo, MD, PhD 11/16/2018, 2:24 PM [facility] [phone] or if no answer [phone]

## 2018-11-16 NOTE — Progress Notes (Signed)
Hypoglycemic Event  CBG: 54  Treatment: 1/2 amp D50W  Symptoms: NONE  Follow-up CBG: Time: 1845 CBG Result: 102  Possible Reasons for Event: D10W Gtt DC'd  Comments/MD notified: Dr. Lamonte Sakai paged    Jesse Sans

## 2018-11-16 NOTE — Progress Notes (Signed)
[facility] ROUNDING NOTE   Subjective:   Resting comfortably.  Admitted 10/11/2018 with necrotizing pancreatitis.  Being treated with meropenem 1 g every 12 hours and anidulafungin.  100 mg daily.  Dosed by pharmacy.  Continues on CRRT  Blood pressure 123/60 pulse 74  temperature 98.7 O2 sats 100% 40% FiO2 ventilator.  IV pressors vasopressin decreased dose 11.2.                     Norepinephrine decreased dose to 19.2.  Removal of 3.9 L with CRRT  Sodium 132 potassium 5.7 chloride 94 CO2 17 BUN 41 creatinine 2.36 calcium 7.8 phosphorus 6.1 magnesium 2.4 Albumin 2.2 amylase 118 lipase 100 AST 168 ALT 49 total bilirubin 7.0 WBC 75.6 hemoglobin 7.3 platelets 320.   Objective:  Vital signs in last 24 hours:  Temp:  [98 F (36.7 C)-101.1 F (38.4 C)] 98.7 F (37.1 C) (12/09 0700) Pulse Rate:  [69-100] 74 (12/09 0800) Resp:  [18-32] 27 (12/09 0800) BP: (88-168)/(38-133) 123/60 (12/09 0800) SpO2:  [87 %-100 %] 100 % (12/09 0800) Arterial Line BP: (86-140)/(38-57) 113/42 (12/09 0800) FiO2 (%):  [30 %-40 %] 40 % (12/09 0800) Weight:  [87.6 kg] 87.6 kg (12/09 0417)  Weight change: 0.2 kg Filed Weights   11/14/18 0500 11/15/18 0430 11/16/18 0417  Weight: 92.1 kg 87.4 kg 87.6 kg    Intake/Output: I/O last 3 completed shifts: In: 5990.7 [I.V.:4476.7; Other:248; NG/GT:106; IV Piggyback:1160] Out: 1610 [Emesis/NG output:1000; Other:6872]   Intake/Output this shift:  Total I/O In: 167.5 [I.V.:165.5; Other:2] Out: 284 [Emesis/NG output:40; Other:244]   Jaundiced appearing awakens to voice CVS- RRR no rubs RS- CTA tracheostomy without drainage ABD-soft distended nonrigid EXT-right femoral non-tunneled tunneled hemodialysis catheter  3+ diffuse anasarca   Basic Metabolic Panel: Recent Labs  Lab 11/11/18 0428  11/12/18 0525  11/14/18 0500  11/14/18 1626  11/15/18 0324 11/15/18 0509 11/15/18 0514 11/15/18 1527 11/16/18 0354  NA  --    < > 135   < > 137    < > 136   < > 133* 133* 136 136 132*  K  --    < > 3.5   < > 4.2   < > 4.2   < > 4.5 4.6 4.3 5.3* 5.7*  CL  --    < > 86*   < > 90*   < > 86*   < > 84* 86* 85* 92* 94*  CO2  --    < > 31   < > 28  --  29  --  30  --   --  23 17*  GLUCOSE  --    < > 168*   < > 92   < > 129*   < > 194* 219* 251* 83 72  BUN  --    < > 70*   < > 104*   < > 64*   < > 62* 55* 46* 51* 41*  CREATININE  --    < > 3.38*   < > 4.22*   < > 2.55*   < > 2.84* 2.70* 2.20* 2.67* 2.36*  CALCIUM  --    < > 9.9   < > 7.9*  --  7.9*  --  8.8*  --   --  8.5* 7.8*  MG 1.9  --  1.9  --  2.2  --   --   --  1.9  --   --   --  2.4  PHOS 2.2*   < > 3.3   < > 4.6  --  3.6  --  4.4  --   --  4.8* 6.1*   < > = values in this interval not displayed.    Liver Function Tests: Recent Labs  Lab 11/11/18 1453 11/12/18 0525  11/13/18 1300 11/14/18 0500 11/14/18 1626 11/15/18 0324 11/15/18 1527 11/16/18 0354  AST 168* 256*  --  110*  --   --   --   --  168*  ALT 26 45*  --  38  --   --   --   --  49*  ALKPHOS 193* 129*  --  128*  --   --   --   --  169*  BILITOT 4.5* 4.4*  --  5.2*  --   --   --   --  7.0*  PROT 7.8 6.8  --  6.1*  --   --   --   --  7.5  ALBUMIN 1.3*  1.3* 1.2*   < > 1.1* 1.2* 1.4* 1.3* 2.3* 2.2*   < > = values in this interval not displayed.   Recent Labs  Lab 11/16/18 0354  LIPASE 100*  AMYLASE 118*   No results for input(s): AMMONIA in the last 168 hours.  CBC: Recent Labs  Lab 11/12/18 0525 11/13/18 1300  11/14/18 1222  11/15/18 0319 11/15/18 0509 11/15/18 0514 11/15/18 0816 11/16/18 0354  WBC 54.4* 44.8*  --  54.7*  --   --   --   --  45.7* 75.6*  NEUTROABS  --   --   --  41.6*  --   --   --   --  34.4* 60.5*  HGB 7.6* 9.1*   < > 7.9*   < > 9.5* 8.8* 9.9* 7.4* 7.3*  HCT 22.8* 27.3*   < > 24.6*   < > 28.0* 26.0* 29.0* 22.7* 23.6*  MCV 85.1 85.0  --  87.5  --   --   --   --  88.3 91.5  PLT 182 162  --  216  --   --   --   --  218 320   < > = values in this interval not displayed.    Cardiac  Enzymes: Recent Labs  Lab 11/11/18 2324 11/12/18 0526 11/12/18 1235  TROPONINI <0.03 <0.03 <0.03    BNP: Invalid input(s): POCBNP  CBG: Recent Labs  Lab 11/15/18 2323 11/16/18 0012 11/16/18 0035 11/16/18 0307 11/16/18 0409  GLUCAP 103* 69* 70 70 47    Microbiology: Results for orders placed or performed during the hospital encounter of 10/30/2018  MRSA PCR Screening     Status: None   Collection Time: 10/30/18  4:23 PM  Result Value Ref Range Status   MRSA by PCR NEGATIVE NEGATIVE Final    Comment:        The GeneXpert MRSA Assay (FDA approved for NASAL specimens only), is one component of a comprehensive MRSA colonization surveillance program. It is not intended to diagnose MRSA infection nor to guide or monitor treatment for MRSA infections. Performed at [facility], [place] [address], [place], [place] [address]   Culture, blood (Routine X 2) w Reflex to ID Panel     Status: None   Collection Time: 10/31/18 10:22 AM  Result Value Ref Range Status   Specimen Description BLOOD RIGHT ARM  Final   Special Requests   Final    BOTTLES  DRAWN AEROBIC ONLY Blood Culture adequate volume   Culture   Final    NO GROWTH 5 DAYS Performed at [facility], [place] [address], [place], [place] [address]    Report Status 11/05/2018 FINAL  Final  Culture, blood (Routine X 2) w Reflex to ID Panel     Status: None   Collection Time: 10/31/18 10:44 AM  Result Value Ref Range Status   Specimen Description BLOOD RIGHT HAND  Final   Special Requests   Final    BOTTLES DRAWN AEROBIC ONLY Blood Culture results may not be optimal due to an inadequate volume of blood received in culture bottles   Culture   Final    NO GROWTH 5 DAYS Performed at [facility], [place] [address] [place], [place] [address]    Report Status 11/05/2018 FINAL  Final  Culture, respiratory (non-expectorated)     Status: None   Collection Time: 10/31/18 11:13 AM  Result Value Ref Range Status    Specimen Description TRACHEAL ASPIRATE  Final   Special Requests NONE  Final   Gram Stain   Final    MODERATE WBC PRESENT,BOTH PMN AND MONONUCLEAR RARE YEAST Performed at [facility], [place] [address] [place], [place] [address]    Culture FEW CANDIDA ALBICANS  Final   Report Status 11/02/2018 FINAL  Final  C difficile quick scan w PCR reflex     Status: None   Collection Time: 11/07/18  9:35 AM  Result Value Ref Range Status   C Diff antigen NEGATIVE NEGATIVE Final   C Diff toxin NEGATIVE NEGATIVE Final   C Diff interpretation No C. difficile detected.  Final    Comment: Performed at [facility], [place] [address] [place], [place] [address]  Culture, blood (routine x 2)     Status: None   Collection Time: 11/07/18  5:46 PM  Result Value Ref Range Status   Specimen Description BLOOD BLOOD RIGHT HAND  Final   Special Requests   Final    BOTTLES DRAWN AEROBIC ONLY Blood Culture results may not be optimal due to an inadequate volume of blood received in culture bottles   Culture   Final    NO GROWTH 5 DAYS Performed at [facility], [place] [address] [place], [place] [address]    Report Status 11/12/2018 FINAL  Final  Culture, blood (routine x 2)     Status: None   Collection Time: 11/07/18  5:46 PM  Result Value Ref Range Status   Specimen Description BLOOD BLOOD LEFT HAND  Final   Special Requests   Final    BOTTLES DRAWN AEROBIC ONLY Blood Culture adequate volume   Culture   Final    NO GROWTH 5 DAYS Performed at [facility], [place] [address], [place], [place] [address]    Report Status 11/12/2018 FINAL  Final  Culture, blood (routine x 2)     Status: None (Preliminary result)   Collection Time: 11/15/18  1:12 PM  Result Value Ref Range Status   Specimen Description BLOOD BLOOD RIGHT HAND  Final   Special Requests AEROBIC BOTTLE ONLY Blood Culture adequate volume  Final   Culture   Final    NO GROWTH <12 HOURS Performed at [facility], [place] [address], [place], [place] [address]    Report Status PENDING  Incomplete  Fungus culture, blood     Status: None (Preliminary result)   Collection  Time: 11/15/18  1:12 PM  Result Value Ref Range Status   Specimen Description BLOOD BLOOD RIGHT HAND  Final   Special Requests AEROBIC BOTTLE ONLY Blood Culture adequate volume  Final   Culture   Final    NO GROWTH <12 HOURS Performed at [facility], [address] [place], [place] [address]    Report Status PENDING  Incomplete  Culture, blood (routine x 2)     Status: None (Preliminary result)   Collection Time: 11/15/18  1:13 PM  Result Value Ref Range Status   Specimen Description BLOOD BLOOD RIGHT HAND  Final   Special Requests IN PEDIATRIC BOTTLE Blood Culture adequate volume  Final   Culture   Final    NO GROWTH <12 HOURS Performed at [facility], [place] [address], [place], [place] [address]    Report Status PENDING  Incomplete  Fungus culture, blood     Status: None (Preliminary result)   Collection Time: 11/15/18  1:13 PM  Result Value Ref Range Status   Specimen Description BLOOD BLOOD RIGHT HAND  Final   Special Requests IN PEDIATRIC BOTTLE Blood Culture adequate volume  Final   Culture   Final    NO GROWTH <12 HOURS Performed at [facility], [place] [address] [place], [place] [address]    Report Status PENDING  Incomplete    Coagulation Studies: Recent Labs    11/15/18 0931  LABPROT 16.2*  INR 1.31    Urinalysis: No results for input(s): COLORURINE, LABSPEC, PHURINE, GLUCOSEU, HGBUR, BILIRUBINUR, KETONESUR, PROTEINUR, UROBILINOGEN, NITRITE, LEUKOCYTESUR in the last 72 hours.  Invalid input(s): APPERANCEUR    Imaging: Dg Abd 1 View  Result Date: 11/15/2018 CLINICAL DATA:  Pancreatitis.  Sepsis. EXAM: ABDOMEN - 1 VIEW COMPARISON:  None. FINDINGS: Nasogastric tube is seen with tip in the gastric antrum. A feeding tube is seen with tip now in the proximal duodenum. Right femoral venous catheter is  seen with tip overlying the right common iliac vein. The bowel gas pattern is normal. IMPRESSION: Normal bowel gas pattern. Feeding tube tip in proximal duodenum. Electronically Signed   By: Earle Gell M.D.   On: 11/15/2018 11:40     Medications:   .  prismasol BGK 4/2.5 800 mL/hr at 11/15/18 2355  .  prismasol BGK 4/2.5 300 mL/hr at 11/16/18 3646  . sodium chloride    . sodium chloride 1,000 mL (11/16/18 0824)  . anidulafungin    . dextrose 125 mL/hr at 11/16/18 0800  . feeding supplement (VITAL AF 1.2 CAL) Stopped (11/14/18 2057)  . heparin 10,000 units/ 20 mL infusion syringe 1,200 Units/hr (11/16/18 0418)  . meropenem (MERREM) IV 1 g (11/15/18 2202)  . norepinephrine 20 mcg/min (11/16/18 0800)  . prismasol BGK 4/2.5 1,500 mL/hr at 11/16/18 0723  . sodium chloride 999 mL/hr at 11/08/18 2039  . vasopressin (PITRESSIN) infusion - *FOR SHOCK* 0.03 Units/min (11/16/18 0800)   . chlorhexidine gluconate (MEDLINE KIT)  15 mL Mouth Rinse BID  . feeding supplement (PRO-STAT SUGAR FREE 64)  30 mL Per Tube BID  . folic acid  1 mg Oral Daily  . HYDROmorphone  2 mg Per Tube Q4H  . insulin aspart  3-9 Units Subcutaneous Q4H  . mouth rinse  15 mL Mouth Rinse 10 times per day  . multivitamin  15 mL Oral Daily  . pantoprazole (PROTONIX) IV  40 mg Intravenous Daily  . sodium chloride flush  10-40 mL Intracatheter Q12H  .  thiamine injection  100 mg Intravenous Daily   Place/Maintain arterial line **AND** sodium chloride, sodium chloride, acetaminophen, dextrose, fentaNYL, heparin, heparin, HYDROmorphone (DILAUDID) injection, midazolam, ondansetron (ZOFRAN) IV, sodium chloride, sodium chloride flush  Assessment/ Plan:   Acute kidney injury from ischemic ATN CRRT discontinued 11/04/2018 but was restarted 11/07/2018 post for line holiday 11/10/2018 resume 11/13/2018 insertion of femoral dialysis catheter.  Using citrate dialysate anticoagulation.  Septic shock with necrotizing pancreatitis patient  is back on meropenem and Eraxis per critical care with increasing white count.  Blood cultures negative.  CT scan 11/12/2018 with contrast necrosis on more organized fluid collections.  Scheduled for CT scan of pelvis without contrast today.  Acute hypoxic respiratory failure status post trach 11/08/2018 continues with hypoxia requiring supplemental oxygen  Hypocalcemia 2.5 calcium bath this corrects for hypoalbuminemia  Anemia transfuse as needed  Nutrition status post TPN has GJ tube placed.  Possible pericarditis with TEE revealing trivial pericardial effusion no heparin CRRT  Temporary hemodialysis catheter removed 12/3 reinserted 11/13/2018  Leukemoid reaction increase white count had received stress dose steroids  Hypokalemia will remove potassium from dialysate  Metabolic acidosis continue with citrate should improve bicarbonate.     LOS: [place] _0 _1 :37 AM

## 2018-11-17 ENCOUNTER — Inpatient Hospital Stay (HOSPITAL_COMMUNITY): Payer: Medicare Other

## 2018-11-17 LAB — CBC
HCT: 21.1 % — ABNORMAL LOW (ref 39.0–52.0)
Hemoglobin: 6.7 g/dL — CL (ref 13.0–17.0)
MCH: 28.2 pg (ref 26.0–34.0)
MCHC: 31.8 g/dL (ref 30.0–36.0)
MCV: 88.7 fL (ref 80.0–100.0)
PLATELETS: 335 10*3/uL (ref 150–400)
RBC: 2.38 MIL/uL — ABNORMAL LOW (ref 4.22–5.81)
RDW: 20.5 % — ABNORMAL HIGH (ref 11.5–15.5)
WBC: 53.6 10*3/uL (ref 4.0–10.5)
nRBC: 0.1 % (ref 0.0–0.2)

## 2018-11-17 LAB — RENAL FUNCTION PANEL
ALBUMIN: 1.9 g/dL — AB (ref 3.5–5.0)
ANION GAP: 16 — AB (ref 5–15)
Albumin: 1.8 g/dL — ABNORMAL LOW (ref 3.5–5.0)
Anion gap: 16 — ABNORMAL HIGH (ref 5–15)
BUN: 42 mg/dL — ABNORMAL HIGH (ref 8–23)
BUN: 40 mg/dL — ABNORMAL HIGH (ref 8–23)
CALCIUM: 7.4 mg/dL — AB (ref 8.9–10.3)
CO2: 21 mmol/L — ABNORMAL LOW (ref 22–32)
CO2: 21 mmol/L — ABNORMAL LOW (ref 22–32)
Calcium: 7.4 mg/dL — ABNORMAL LOW (ref 8.9–10.3)
Chloride: 96 mmol/L — ABNORMAL LOW (ref 98–111)
Chloride: 96 mmol/L — ABNORMAL LOW (ref 98–111)
Creatinine, Ser: 1.96 mg/dL — ABNORMAL HIGH (ref 0.61–1.24)
Creatinine, Ser: 1.97 mg/dL — ABNORMAL HIGH (ref 0.61–1.24)
GFR calc Af Amer: 40 mL/min — ABNORMAL LOW (ref >60)
GFR calc Af Amer: 40 mL/min — ABNORMAL LOW (ref >60)
GFR calc non Af Amer: 34 mL/min — ABNORMAL LOW (ref >60)
GFR calc non Af Amer: 34 mL/min — ABNORMAL LOW (ref >60)
Glucose, Bld: 128 mg/dL — ABNORMAL HIGH (ref 70–99)
Glucose, Bld: 155 mg/dL — ABNORMAL HIGH (ref 70–99)
PHOSPHORUS: 5 mg/dL — AB (ref 2.5–4.6)
Phosphorus: 4.4 mg/dL (ref 2.5–4.6)
Potassium: 4.1 mmol/L (ref 3.5–5.1)
Potassium: 3.7 mmol/L (ref 3.5–5.1)
Sodium: 133 mmol/L — ABNORMAL LOW (ref 135–145)
Sodium: 133 mmol/L — ABNORMAL LOW (ref 135–145)

## 2018-11-17 LAB — GLUCOSE, CAPILLARY
Glucose-Capillary: 116 mg/dL — ABNORMAL HIGH (ref 70–99)
Glucose-Capillary: 131 mg/dL — ABNORMAL HIGH (ref 70–99)
Glucose-Capillary: 139 mg/dL — ABNORMAL HIGH (ref 70–99)
Glucose-Capillary: 149 mg/dL — ABNORMAL HIGH (ref 70–99)
Glucose-Capillary: 149 mg/dL — ABNORMAL HIGH (ref 70–99)
Glucose-Capillary: 164 mg/dL — ABNORMAL HIGH (ref 70–99)

## 2018-11-17 LAB — POCT ACTIVATED CLOTTING TIME
ACTIVATED CLOTTING TIME: 202 s
Activated Clotting Time: 202 seconds
Activated Clotting Time: 202 seconds
Activated Clotting Time: 197 seconds

## 2018-11-17 LAB — HEMOGLOBIN AND HEMATOCRIT, BLOOD
HCT: 25.1 % — ABNORMAL LOW (ref 39.0–52.0)
Hemoglobin: 7.8 g/dL — ABNORMAL LOW (ref 13.0–17.0)

## 2018-11-17 LAB — MAGNESIUM: Magnesium: 2.6 mg/dL — ABNORMAL HIGH (ref 1.7–2.4)

## 2018-11-17 LAB — PREPARE RBC (CROSSMATCH)

## 2018-11-17 LAB — APTT: APTT: 192 s — AB (ref 24–36)

## 2018-11-17 MED ORDER — LIDOCAINE VISCOUS HCL 2 % MT SOLN
OROMUCOSAL | Status: AC
  Administered 2018-11-17: 1 mL
  Filled 2018-11-17: qty 15

## 2018-11-17 MED ORDER — SODIUM CHLORIDE 0.9% IV SOLUTION
Freq: Once | INTRAVENOUS | Status: AC
  Administered 2018-11-17: 09:00:00 via INTRAVENOUS

## 2018-11-17 MED ORDER — VITAL AF 1.2 CAL PO LIQD: 1000.0000 mL | ORAL | Status: DC

## 2018-11-17 MED ORDER — LIDOCAINE VISCOUS HCL 2 % MT SOLN
OROMUCOSAL | Status: AC
  Administered 2018-11-17: 9 mL via NASAL
  Filled 2018-11-17: qty 15

## 2018-11-17 MED ORDER — IOPAMIDOL (ISOVUE-300) INJECTION 61%
INTRAVENOUS | Status: AC
  Administered 2018-11-17: 14 mL via GASTROSTOMY
  Administered 2018-11-17: 50 mL
  Filled 2018-11-17: qty 50

## 2018-11-17 NOTE — Progress Notes (Signed)
PT Cancellation Note  Patient Details Name: Derek Riley MRN: [id number] DOB: 04-Nov-1951   Cancelled Treatment:    Reason Eval/Treat Not Completed: Other (comment).  Pt continues to be on CVVHD through a R femoral access.  PT will continue to follow along to see when mobility will be appropriate.  We can see people on CVVHD, however it is easier if they have IJ access as the access point is less likely to get occluded during mobility.    Thanks,   Derek Riley, PT, DPT  Acute Rehabilitation [phone] pager [phone]) [phone] office     [name] 11/17/2018, 4:56 PM

## 2018-11-17 NOTE — Progress Notes (Addendum)
[facility] ROUNDING NOTE   Subjective:   Resting comfortably.  Admitted 10/16/2018 with necrotizing pancreatitis.  Being treated with meropenem 1 g every 12 hours and anidulafungin.  100 mg daily.  Dosed by pharmacy.  Continues on CRRT  Blood pressure 132/41 pulse 70 temperature 97.6 O2 sats 99% FiO2 40%  IV pressors                      Norepinephrine decreased dose to 35.4  Removal of 4.9 L  Sodium 133 potassium 4.1 chloride 96 CO2 21 glucose 128 BUN 42 creatinine 1.96 calcium 7.4 phosphorus 5.0 magnesium 2.6 Albumin 1.9 WBC 53.6 hemoglobin 6.7 platelets 335.   Objective:  Vital signs in last 24 hours:  Temp:  [97.6 F (36.4 C)-99.3 F (37.4 C)] 97.6 F (36.4 C) (12/10 0922) Pulse Rate:  [67-87] 70 (12/10 0922) Resp:  [17-34] 17 (12/10 0922) BP: (75-152)/(38-114) 101/50 (12/10 0800) SpO2:  [97 %-100 %] 99 % (12/10 0922) Arterial Line BP: (98-155)/(34-51) 132/41 (12/10 0922) FiO2 (%):  [40 %] 40 % (12/10 0922) Weight:  [85.2 kg] 85.2 kg (12/10 0500)  Weight change: -2.4 kg Filed Weights   11/15/18 0430 11/16/18 0417 11/17/18 0500  Weight: 87.4 kg 87.6 kg 85.2 kg    Intake/Output: I/O last 3 completed shifts: In: 4436.3 [I.V.:3808.1; Other:198; IV Piggyback:430.2] Out: 7217 [Emesis/NG output:1100; WERXV:4008]   Intake/Output this shift:  Total I/O In: 145.3 [I.V.:145.3] Out: 385 [Emesis/NG output:50; Other:335]   Jaundiced appearing awakens to voice CVS- RRR no rubs RS- CTA tracheostomy without drainage ABD-soft distended nonrigid EXT-right femoral non-tunneled tunneled hemodialysis catheter  3+ diffuse anasarca   Basic Metabolic Panel: Recent Labs  Lab 11/12/18 0525  11/14/18 0500  11/15/18 0324  11/15/18 1527 11/16/18 0354 11/16/18 1229 11/16/18 1611 11/17/18 0516  NA 135   < > 137   < > 133*   < > 136 132* 131* 132* 133*  K 3.5   < > 4.2   < > 4.5   < > 5.3* 5.7* 5.2* 5.4* 4.1  CL 86*   < > 90*   < > 84*   < > 92* 94* 94* 92* 96*   CO2 31   < > 28   < > 30  --  23 17* 20* 22 21*  GLUCOSE 168*   < > 92   < > 194*   < > 83 72 133* 61* 128*  BUN 70*   < > 104*   < > 62*   < > 51* 41* 40* 42* 42*  CREATININE 3.38*   < > 4.22*   < > 2.84*   < > 2.67* 2.36* 2.18* 2.06* 1.96*  CALCIUM 9.9   < > 7.9*   < > 8.8*  --  8.5* 7.8* 7.7* 7.6* 7.4*  MG 1.9  --  2.2  --  1.9  --   --  2.4  --   --  2.6*  PHOS 3.3   < > 4.6   < > 4.4  --  4.8* 6.1* 5.4* 5.2* 5.0*   < > = values in this interval not displayed.    Liver Function Tests: Recent Labs  Lab 11/11/18 1453 11/12/18 0525  11/13/18 1300  11/15/18 1527 11/16/18 0354 11/16/18 1229 11/16/18 1611 11/17/18 0516  AST 168* 256*  --  110*  --   --  168*  --   --   --   ALT 26 45*  --  38  --   --  49*  --   --   --   ALKPHOS 193* 129*  --  128*  --   --  169*  --   --   --   BILITOT 4.5* 4.4*  --  5.2*  --   --  7.0*  --   --   --   PROT 7.8 6.8  --  6.1*  --   --  7.5  --   --   --   ALBUMIN 1.3*  1.3* 1.2*   < > 1.1*   < > 2.3* 2.2* 2.1* 2.1* 1.9*   < > = values in this interval not displayed.   Recent Labs  Lab 11/16/18 0354  LIPASE 100*  AMYLASE 118*   No results for input(s): AMMONIA in the last 168 hours.  CBC: Recent Labs  Lab 11/13/18 1300  11/14/18 1222  11/15/18 0509 11/15/18 0514 11/15/18 0816 11/16/18 0354 11/17/18 0516  WBC 44.8*  --  54.7*  --   --   --  45.7* 75.6* 53.6*  NEUTROABS  --   --  41.6*  --   --   --  34.4* 60.5*  --   HGB 9.1*   < > 7.9*   < > 8.8* 9.9* 7.4* 7.3* 6.7*  HCT 27.3*   < > 24.6*   < > 26.0* 29.0* 22.7* 23.6* 21.1*  MCV 85.0  --  87.5  --   --   --  88.3 91.5 88.7  PLT 162  --  216  --   --   --  218 320 335   < > = values in this interval not displayed.    Cardiac Enzymes: Recent Labs  Lab 11/11/18 2324 11/12/18 0526 11/12/18 1235  TROPONINI <0.03 <0.03 <0.03    BNP: Invalid input(s): POCBNP  CBG: Recent Labs  Lab 11/16/18 1846 11/16/18 1940 11/16/18 2326 11/17/18 0410 11/17/18 0738  GLUCAP 102*  73 65 116* 131*    Microbiology: Results for orders placed or performed during the hospital encounter of 10/19/2018  MRSA PCR Screening     Status: None   Collection Time: 10/30/18  4:23 PM  Result Value Ref Range Status   MRSA by PCR NEGATIVE NEGATIVE Final    Comment:        The GeneXpert MRSA Assay (FDA approved for NASAL specimens only), is one component of a comprehensive MRSA colonization surveillance program. It is not intended to diagnose MRSA infection nor to guide or monitor treatment for MRSA infections. Performed at [facility], [place] [address] [place], [place] [address]   Culture, blood (Routine X 2) w Reflex to ID Panel     Status: None   Collection Time: 10/31/18 10:22 AM  Result Value Ref Range Status   Specimen Description BLOOD RIGHT ARM  Final   Special Requests   Final    BOTTLES DRAWN AEROBIC ONLY Blood Culture adequate volume   Culture   Final    NO GROWTH 5 DAYS Performed at [facility], [address], [place], [place] [address]    Report Status 11/05/2018 FINAL  Final  Culture, blood (Routine X 2) w Reflex to ID Panel     Status: None   Collection Time: 10/31/18 10:44 AM  Result Value Ref Range Status   Specimen Description BLOOD RIGHT HAND  Final   Special Requests   Final    BOTTLES DRAWN AEROBIC ONLY Blood  Culture results may not be optimal due to an inadequate volume of blood received in culture bottles   Culture   Final    NO GROWTH 5 DAYS Performed at [facility], [place] [address] [place], [place] [address]    Report Status 11/05/2018 FINAL  Final  Culture, respiratory (non-expectorated)     Status: None   Collection Time: 10/31/18 11:13 AM  Result Value Ref Range Status   Specimen Description TRACHEAL ASPIRATE  Final   Special Requests NONE  Final   Gram Stain   Final    MODERATE WBC PRESENT,BOTH PMN AND MONONUCLEAR RARE YEAST Performed at [facility], [place] [address], [place], [place] [address]    Culture  FEW CANDIDA ALBICANS  Final   Report Status 11/02/2018 FINAL  Final  C difficile quick scan w PCR reflex     Status: None   Collection Time: 11/07/18  9:35 AM  Result Value Ref Range Status   C Diff antigen NEGATIVE NEGATIVE Final   C Diff toxin NEGATIVE NEGATIVE Final   C Diff interpretation No C. difficile detected.  Final    Comment: Performed at [facility], [place] [address], [place], [place] [address]  Culture, blood (routine x 2)     Status: None   Collection Time: 11/07/18  5:46 PM  Result Value Ref Range Status   Specimen Description BLOOD BLOOD RIGHT HAND  Final   Special Requests   Final    BOTTLES DRAWN AEROBIC ONLY Blood Culture results may not be optimal due to an inadequate volume of blood received in culture bottles   Culture   Final    NO GROWTH 5 DAYS Performed at [facility], [place] [address], [place], [place] [address]    Report Status 11/12/2018 FINAL  Final  Culture, blood (routine x 2)     Status: None   Collection Time: 11/07/18  5:46 PM  Result Value Ref Range Status   Specimen Description BLOOD BLOOD LEFT HAND  Final   Special Requests   Final    BOTTLES DRAWN AEROBIC ONLY Blood Culture adequate volume   Culture   Final    NO GROWTH 5 DAYS Performed at [facility], [place] [address] [place], [place] [address]    Report Status 11/12/2018 FINAL  Final  Culture, blood (routine x 2)     Status: Abnormal (Preliminary result)   Collection Time: 11/15/18  1:12 PM  Result Value Ref Range Status   Specimen Description BLOOD SITE NOT SPECIFIED  Final   Special Requests AEROBIC BOTTLE ONLY Blood Culture adequate volume  Final   Culture  Setup Time   Final    GRAM POSITIVE COCCI AEROBIC BOTTLE ONLY CRITICAL RESULT CALLED TO, READ BACK BY AND VERIFIED WITH: Reece Packer PharmD 17:35 11/16/18 ([email or web address]) Performed at [facility], [place] [address], [place], [place] [address]    Culture STAPHYLOCOCCUS SPECIES (COAGULASE NEGATIVE) (A)  Final   Report  Status PENDING  Incomplete  Fungus culture, blood     Status: None (Preliminary result)   Collection Time: 11/15/18  1:12 PM  Result Value Ref Range Status   Specimen Description BLOOD BLOOD RIGHT HAND  Final   Special Requests AEROBIC BOTTLE ONLY Blood Culture adequate volume  Final   Culture   Final    NO GROWTH 1 DAY Performed at [facility], [place] [address], [place], [place] [address]    Report Status  PENDING  Incomplete  Blood Culture ID Panel (Reflexed)     Status: Abnormal   Collection Time: 11/15/18  1:12 PM  Result Value Ref Range Status   Enterococcus species NOT DETECTED NOT DETECTED Final   Listeria monocytogenes NOT DETECTED NOT DETECTED Final   Staphylococcus species DETECTED (A) NOT DETECTED Final    Comment: Methicillin (oxacillin) resistant coagulase negative staphylococcus. Possible blood culture contaminant (unless isolated from more than one blood culture draw or clinical case suggests pathogenicity). No antibiotic treatment is indicated for blood  culture contaminants. CRITICAL RESULT CALLED TO, READ BACK BY AND VERIFIED WITH: Reece Packer PharmD 17:35 11/16/18 ([email or web address])    Staphylococcus aureus (BCID) NOT DETECTED NOT DETECTED Final   Methicillin resistance DETECTED (A) NOT DETECTED Final    Comment: CRITICAL RESULT CALLED TO, READ BACK BY AND VERIFIED WITH: Reece Packer PharmD 17:35 11/16/18 ([email or web address])    Streptococcus species NOT DETECTED NOT DETECTED Final   Streptococcus agalactiae NOT DETECTED NOT DETECTED Final   Streptococcus pneumoniae NOT DETECTED NOT DETECTED Final   Streptococcus pyogenes NOT DETECTED NOT DETECTED Final   Acinetobacter baumannii NOT DETECTED NOT DETECTED Final   Enterobacteriaceae species NOT DETECTED NOT DETECTED Final   Enterobacter cloacae complex NOT DETECTED NOT DETECTED Final   Escherichia coli NOT DETECTED NOT DETECTED Final   Klebsiella oxytoca NOT DETECTED NOT DETECTED Final   Klebsiella pneumoniae NOT DETECTED NOT DETECTED  Final   Proteus species NOT DETECTED NOT DETECTED Final   Serratia marcescens NOT DETECTED NOT DETECTED Final   Haemophilus influenzae NOT DETECTED NOT DETECTED Final   Neisseria meningitidis NOT DETECTED NOT DETECTED Final   Pseudomonas aeruginosa NOT DETECTED NOT DETECTED Final   Candida albicans NOT DETECTED NOT DETECTED Final   Candida glabrata NOT DETECTED NOT DETECTED Final   Candida krusei NOT DETECTED NOT DETECTED Final   Candida parapsilosis NOT DETECTED NOT DETECTED Final   Candida tropicalis NOT DETECTED NOT DETECTED Final    Comment: Performed at [facility], [place], [place], [place] [address]  Culture, blood (routine x 2)     Status: None (Preliminary result)   Collection Time: 11/15/18  1:13 PM  Result Value Ref Range Status   Specimen Description BLOOD BLOOD RIGHT HAND  Final   Special Requests IN PEDIATRIC BOTTLE Blood Culture adequate volume  Final   Culture   Final    NO GROWTH 1 DAY Performed at [facility], [place] [address] [place], [place] [address]    Report Status PENDING  Incomplete  Fungus culture, blood     Status: None (Preliminary result)   Collection Time: 11/15/18  1:13 PM  Result Value Ref Range Status   Specimen Description BLOOD BLOOD RIGHT HAND  Final   Special Requests IN PEDIATRIC BOTTLE Blood Culture adequate volume  Final   Culture   Final    NO GROWTH 1 DAY Performed at [facility], [place] [address] [place], [place] [address]    Report Status PENDING  Incomplete    Coagulation Studies: Recent Labs    11/15/18 0931  LABPROT 16.2*  INR 1.31    Urinalysis: No results for input(s): COLORURINE, LABSPEC, PHURINE, GLUCOSEU, HGBUR, BILIRUBINUR, KETONESUR, PROTEINUR, UROBILINOGEN, NITRITE, LEUKOCYTESUR in the last 72 hours.  Invalid input(s): APPERANCEUR    Imaging: No results found.   Medications:   .  prismasol BGK 4/2.5 800 mL/hr at 11/17/18 0721  .  prismasol BGK 4/2.5 300 mL/hr at 11/16/18 2047  .  sodium chloride    . sodium chloride 10 mL/hr at 11/17/18 0900  . anidulafungin Stopped (11/16/18 1746)  . dextrose 40 mL/hr at 11/17/18 0900  . feeding supplement (VITAL AF 1.2 CAL) Stopped (11/14/18 2057)  . heparin 10,000 units/ 20 mL infusion syringe 1,900 Units/hr (11/17/18 0821)  . meropenem (MERREM) IV Stopped (11/16/18 2227)  . norepinephrine 23 mcg/min (11/17/18 0900)  . prismasol BGK 0/2.5 1,500 mL/hr at 11/17/18 0820  . sodium chloride 999 mL/hr at 11/08/18 2039  . vasopressin (PITRESSIN) infusion - *FOR SHOCK* Stopped (11/17/18 0859)   . chlorhexidine gluconate (MEDLINE KIT)  15 mL Mouth Rinse BID  . Chlorhexidine Gluconate Cloth  6 each Topical Q0600  . feeding supplement (PRO-STAT SUGAR FREE 64)  30 mL Per Tube BID  . folic acid  1 mg Oral Daily  . HYDROmorphone  1 mg Per Tube Q4H  . insulin aspart  0-9 Units Subcutaneous Q4H  . mouth rinse  15 mL Mouth Rinse 10 times per day  . multivitamin  15 mL Oral Daily  . mupirocin ointment  1 application Nasal BID  . pantoprazole (PROTONIX) IV  40 mg Intravenous Daily  . sodium chloride flush  10-40 mL Intracatheter Q12H  . thiamine injection  100 mg Intravenous Daily   Place/Maintain arterial line **AND** sodium chloride, sodium chloride, acetaminophen, fentaNYL, heparin, heparin, HYDROmorphone (DILAUDID) injection, midazolam, ondansetron (ZOFRAN) IV, sodium chloride, sodium chloride flush  Assessment/ Plan:   Acute kidney injury from ischemic ATN CRRT discontinued 11/04/2018 but was restarted 11/07/2018.  11/13/2018 insertion of femoral dialysis catheter.  Using citrate dialysate anticoagulation.  Septic shock with necrotizing pancreatitis patient is back on meropenem and Eraxis per critical care with increasing white count.  Blood cultures negative.  CT scan 11/12/2018 with contrast necrosis on more organized fluid collections.  Scheduled for CT scan of pelvis without contrast today.  Acute hypoxic respiratory failure status  post trach 11/08/2018 continues with hypoxia requiring supplemental oxygen  Hypocalcemia 2.5 calcium bath this corrects for hypoalbuminemia  Anemia transfuse as needed  Nutrition status post TPN has GJ tube placed.  Possible pericarditis with TEE revealing trivial pericardial effusion no heparin CRRT  Temporary hemodialysis catheter removed 12/3 reinserted 11/13/2018  Leukemoid reaction increase white count had received stress dose steroids  Hypokalemia will remove potassium from dialysate  Metabolic acidosis continue with citrate should improve bicarbonate.    Family meeting later today to discuss goals of care.  We will be happy to attend if needed.  [phone]  LOS: [place] _0 _1 :44 AM

## 2018-11-17 NOTE — Progress Notes (Signed)
Nutrition Follow-up  DOCUMENTATION CODES:   Not applicable  INTERVENTION:    Recommend Cortrak tube tip at the Ligament of Treitz.   After Cortrak feeding tube replaced by Fluoroscopy, resume TF at trickle rate: Vital AF 1.2 at 20 ml/h  Advance as tolerated to goal rate of 65 ml/h with Pro-stat 30 ml BID to provide 2072 kcal, 147 gm protein, 1265 ml free water daily.  NUTRITION DIAGNOSIS:   Inadequate oral intake related to inability to eat as evidenced by NPO status.  Ongoing   GOAL:   Patient will meet greater than or equal to 90% of their needs  Unmet   MONITOR:   Vent status, TF tolerance, Labs, Skin, I & O's  ASSESSMENT:   67 y/o male PMHx htn/hld, dm2. Presented w/ abd pain that began suddenly 1 day PTA. Supportive care from PCP ineffective and presented to ED w/ vomiting. Determined to have severe gallstone pancreatitis w/ related AKI. Decompensated after arrival, developing  worsening renal failure. On 11/22 transfered to ICU and required intubation. Begun on pressors and CRRT.    Patient remains on CRRT. Cortrak tube clogged. TF has been off since Sunday per RN.  Spoke with IR team who contacted Fluoroscopy regarding feeding tube replacement.  Fluoroscopy to replace Cortrak tube later today. Recommend tip of tube at the ligament of Treitz. Per discussion with CCM, will resume trickle TF after tube is placed.  Patient is currently intubated on ventilator support MV: 14 L/min Temp (24hrs), Avg:98.2 F (36.8 C), Min:97.6 F (36.4 C), Max:99.1 F (37.3 C)   Labs reviewed. Sodium 133 (L), phosphorus 5 (H), magnesium 2.6 (H) Medications reviewed and include vasopressin, levophed, MVI.   Diet Order:   Diet Order            Diet NPO time specified  Diet effective now              EDUCATION NEEDS:   No education needs have been identified at this time  Skin:  Skin Assessment: Skin Integrity Issues: Skin Integrity Issues:: DTI DTI: head  Last BM:   12/6 type 6  Height:   Ht Readings from Last 1 Encounters:  11/06/18 6' (1.829 m)    Weight:   Wt Readings from Last 1 Encounters:  11/17/18 85.2 kg    Ideal Body Weight:  80.91 kg  BMI:  Body mass index is 25.47 kg/m.  Estimated Nutritional Needs:   Kcal:  2050  Protein:  140-180 gm  Fluid:  2.1-2.3 L    Molli Barrows, RD, LDN, CNSC Pager [phone] After Hours Pager [phone]

## 2018-11-17 NOTE — Progress Notes (Signed)
[facility] Team  Patient Details Name: Derek Riley MRN: [id number] DOB: December 26, 1950 Today's Date: 11/17/2018 Time:  -    Chart reviewed past several days as part of trach team. Noted family to make decision re: pt's treatment plan. Pt may benefit from inline PMV to help express wishes if pt able. SLP will contact MD or order if agree.               [place], Xitlali Kastens Willis 11/17/2018, 2:05 PM   Orbie Pyo Colvin Caroli.Ed Risk analyst [phone] Office [phone]

## 2018-11-17 NOTE — Progress Notes (Signed)
[facility] Progress Note Patient Name: Derek Riley DOB: [birth year] MRN: [id number]   Date of Service  11/17/2018  HPI/Events of Note  Anemia - Hgb = 6.7.  eICU Interventions  Will transfuse 1 unit PRBC.      Intervention Category Major Interventions: Other:  Lysle Dingwall 11/17/2018, 6:43 AM

## 2018-11-17 NOTE — Progress Notes (Signed)
NAME:  Derek Riley, MRN:  [id number], DOB:  04-Aug-1951, LOS: 53 ADMISSION DATE:  10/30/2018, CONSULTATION DATE:  11/22 REFERRING MD:  11/22, CHIEF COMPLAINT:  Worsening metabolic acidosis and renal failure w/ clinical decline  Brief History   67 year old male admitted 11/20 w/ gallstone pancreatitis. PCCM consulted 11/22 for progressive metabolic acidosis, acute renal failure and persistent clinical decline.   Past Medical History  Diabetes, gallstones, hypertension.  Significant Hospital Events   11/20 transferred to [facility] from [place] w/ acute gallstone pancreatitis. GI & surgicals services consulted. Started On supportive IVFs, empiric zosyn and analgesic support.  Surgical service is planning on cholecystectomy once pancreatitis improved.  Initial lipase on arrival to [place], creatinine 1.72 11/21: MRCP 11/21 showing extensive pancreatitis, possible hemorrhagic peripancreatic fluid no choledocholithiasis, lipase 3361, creatinine up to 4.91, anion gap 13.  No role for ERCP continued supportive care 11/22: Creatinine up to 6.95, gap now 16, progressive diffuse mottling, worse pain, critical care consulted for shock, and progressive multiorgan failure, moving to intensive care for CRRT as well as hemodynamic support. 3.7 liters + since admit, developed worsening resp failure requiring intubation shortly after arrival. Possible aspiration during intubation  11/26 started on meropenem (d/t rising CBC) CRRT stopped 11/27 extubated, pressors off.  11/28 look weaker. WIll probably need re-intubation. 11/29 reintubated for increasing WOB, likely aspiration even overnight.  11/30: Remains intubated.  White blood cell count now up to 62,000.  CRRT resumed 12/1: No significant change.  Ongoing cuff leak opting for early tracheostomy versus endotracheal tube change.  12/2: tolerating PS wean at 10/5, CT of the abdomen/pelvis ordered 12/6: Initiation of CRRT to remove significant  cumulative fluid balance.  Consults:  11/20 GI and surgical services 11/22 critical care  Procedures:  11/22 Foley catheter 11/22 right IJ CVL (HD)>>> 11/22 OETT 11/22>>>12/1 Trach 12/1 (JY)>>>  Significant Diagnostic Tests:  MRCP 11/21: Acute pancreatitis with extensive pancreatic and periPancreatic inflammatory changes, including proteinaceous and/or hemorrhagic peri-pancreatic fluid.  No choledocholithiasis or biliary tract obstruction.  Small volume of ascites, trace bilateral pleural effusions.  Bilateral basilar volume loss  CT abdomen/pelvis 10/9>> persistent peripancreatic edema and fluid, slightly more organized, small area of apparent pancreatic necrosis, no intra-or extrahepatic biliary ductal dilatation, small bilateral pleural effusions with lower lobe atelectatic change  Micro Data:  Cultures negative  Antimicrobials:  Zosyn 11/20>>>11/26 Meropenem 11/26>>> 11/11/2018 Anidulafungin 12/8 >>  Meropenem 12/8 >>   Interim history/subjective:  He has had periods of hypoglycemia last 24h, received D50 Now 3L net negative, still taking off volume Remains on norepinephrine   Objective   Blood pressure (!) 101/50, pulse 70, temperature 97.6 F (36.4 C), temperature source Oral, resp. rate 17, height 6' (1.829 m), weight 85.2 kg, SpO2 99 %. CVP:  [15 mmHg-20 mmHg] 18 mmHg  Vent Mode: PRVC FiO2 (%):  [40 %] 40 % Set Rate:  [18 bmp] 18 bmp Vt Set:  [620 mL] 620 mL PEEP:  [5 cmH20] 5 cmH20 Plateau Pressure:  [11 cmH20-20 cmH20] 13 cmH20   Intake/Output Summary (Last 24 hours) at 11/17/2018 0934 Last data filed at 11/17/2018 0900 Gross per 24 hour  Intake 2194.05 ml  Output 3965 ml  Net -1770.95 ml   Filed Weights   11/15/18 0430 11/16/18 0417 11/17/18 0500  Weight: 87.4 kg 87.6 kg 85.2 kg    Examination: General appearance: Ill-appearing man, ventilated Eyes: Mild scleral icterus, scleral edema, pupils equal HENT: Tracheostomy in place, clean and dry Neck:  No  JVD Lungs: Distant, few basilar inspiratory crackles, no wheezing CV: Regular, no murmur Abdomen: Distended, tense, positive bowel sounds Extremities: Trace to 1+ lower extremity edema, improved Skin: No rash Neuro: Wakes to voice, does follow some commands, moves upper extremities    Resolved Hospital Problem list     Assessment & Plan:   Acute gallstone pancreatitis development of pancreatic necrosis no obvious abscess or cystic formation. -No evidence of choledocholelithiasis on MRCP -CT abdomen reassuring 12/9 Plan Continue his current antibiotics, antifungals GJ feeding when possible  Shock.  Suspect distributive, in part related to volume removal with CVVH. Continue to wean norepinephrine, except systolic greater than 90 Goal even fluid balance, currently -3 L  Abdominal pain secondary to above Body decreased on 12/9, minimize if possible  Ileus To new current bowel regimen, CT abdomen 12/9 reassuring  Ongoing leukocytosis in setting of pancreatic inflammation Plan Significantly elevated, improving some over the last 24 hours with the reinitiation of meropenem and anidulafungin.  Acute hypoxic respiratory failure requiring mechanical ventilation in the setting of acute aspiration event 11/29, progressive atelectasis, and decreased abdominal compliance from pain and impaired cough status post tracheostomy currently requiring ventilatory support. Plan Continue to try to push pressure support ventilation as he can tolerate May be in a position to try trach collar soon  Severe deconditioning and protein calorie malnutrition Plan Goal GJ tube placement when stable to do so by IR, need to work on nutritional status  Acute kidney injury with baseline creatinine 1.  This was hemodynamically mediated plus/minus contrast-induced nephropathy Plan Appreciate nephrology assistance.  My goal would be even fluid balance at this point.  His anasarca is improved and respiratory  weaning appears to be improved as well. Looks unlikely that he will have a renal recovery.  Hyperglycemia, then also periods of hypoglycemia Plan Insulin coverage as ordered  Elevated cumulative fluid balance and electrolyte imbalance: Hyperkalemia Plan Follow BMP closely, replace electrolytes as indicated  Met with the patient's brother, daughter-in-law, niece at bedside today 12/10.  Again we focused on the acute issues but also on the long-term ramifications of this illness, the barriers to him getting back to his usual quality of life.  They underscored that his goal would be to have some independence, that he would not want to be in an institution or dependent on devices.  I have advised them that the likely course is that he would be going to an LTAC, will be dialysis dependent, potentially ventilator dependent.  They are going to continue to discuss and may want to withdraw care.  I recommended that we should not perform CPR should he acutely decompensate and they agree.  I will change his CODE STATUS and the orders.  This patient is critically ill with multiple organ system failure; which, requires frequent high complexity decision making, assessment, support, evaluation, and titration of therapies. This was completed through the application of advanced monitoring technologies and extensive interpretation of multiple databases. During this encounter critical care time was devoted to patient care services described in this note for 35 minutes.     Baltazar Apo, MD, PhD 11/17/2018, 9:34 AM [facility] [phone] or if no answer [phone]

## 2018-11-18 ENCOUNTER — Inpatient Hospital Stay (HOSPITAL_COMMUNITY): Payer: Medicare Other

## 2018-11-18 LAB — TYPE AND SCREEN
ABO/RH(D): B POS
Antibody Screen: NEGATIVE
Unit division: 0

## 2018-11-18 LAB — RENAL FUNCTION PANEL
Albumin: 1.8 g/dL — ABNORMAL LOW (ref 3.5–5.0)
Albumin: 1.7 g/dL — ABNORMAL LOW (ref 3.5–5.0)
Anion gap: 13 (ref 5–15)
Anion gap: 11 (ref 5–15)
BUN: 41 mg/dL — ABNORMAL HIGH (ref 8–23)
BUN: 34 mg/dL — AB (ref 8–23)
CO2: 23 mmol/L (ref 22–32)
CO2: 25 mmol/L (ref 22–32)
Calcium: 7.6 mg/dL — ABNORMAL LOW (ref 8.9–10.3)
Calcium: 7.6 mg/dL — ABNORMAL LOW (ref 8.9–10.3)
Chloride: 98 mmol/L (ref 98–111)
Chloride: 100 mmol/L (ref 98–111)
Creatinine, Ser: 1.93 mg/dL — ABNORMAL HIGH (ref 0.61–1.24)
Creatinine, Ser: 1.69 mg/dL — ABNORMAL HIGH (ref 0.61–1.24)
GFR calc Af Amer: 41 mL/min — ABNORMAL LOW (ref >60)
GFR calc Af Amer: 48 mL/min — ABNORMAL LOW (ref >60)
GFR calc non Af Amer: 35 mL/min — ABNORMAL LOW (ref >60)
GFR calc non Af Amer: 41 mL/min — ABNORMAL LOW (ref >60)
Glucose, Bld: 152 mg/dL — ABNORMAL HIGH (ref 70–99)
Glucose, Bld: 164 mg/dL — ABNORMAL HIGH (ref 70–99)
Phosphorus: 3.7 mg/dL (ref 2.5–4.6)
Phosphorus: 3 mg/dL (ref 2.5–4.6)
Potassium: 3 mmol/L — ABNORMAL LOW (ref 3.5–5.1)
Potassium: 3.8 mmol/L (ref 3.5–5.1)
Sodium: 134 mmol/L — ABNORMAL LOW (ref 135–145)
Sodium: 136 mmol/L (ref 135–145)

## 2018-11-18 LAB — BPAM RBC
BLOOD PRODUCT EXPIRATION DATE: 201912252359
ISSUE DATE / TIME: 201912100902
UNIT TYPE AND RH: 7300

## 2018-11-18 LAB — GLUCOSE, CAPILLARY
GLUCOSE-CAPILLARY: 137 mg/dL — AB (ref 70–99)
Glucose-Capillary: 140 mg/dL — ABNORMAL HIGH (ref 70–99)
Glucose-Capillary: 177 mg/dL — ABNORMAL HIGH (ref 70–99)
Glucose-Capillary: 164 mg/dL — ABNORMAL HIGH (ref 70–99)
Glucose-Capillary: 146 mg/dL — ABNORMAL HIGH (ref 70–99)
Glucose-Capillary: 178 mg/dL — ABNORMAL HIGH (ref 70–99)

## 2018-11-18 LAB — CBC
HCT: 24.2 % — ABNORMAL LOW (ref 39.0–52.0)
Hemoglobin: 8.1 g/dL — ABNORMAL LOW (ref 13.0–17.0)
MCH: 29.8 pg (ref 26.0–34.0)
MCHC: 33.5 g/dL (ref 30.0–36.0)
MCV: 89 fL (ref 80.0–100.0)
PLATELETS: 309 10*3/uL (ref 150–400)
RBC: 2.72 MIL/uL — ABNORMAL LOW (ref 4.22–5.81)
RDW: 19.9 % — ABNORMAL HIGH (ref 11.5–15.5)
WBC: 46.7 10*3/uL — ABNORMAL HIGH (ref 4.0–10.5)
nRBC: 0.1 % (ref 0.0–0.2)

## 2018-11-18 LAB — MAGNESIUM: Magnesium: 2.7 mg/dL — ABNORMAL HIGH (ref 1.7–2.4)

## 2018-11-18 LAB — POCT ACTIVATED CLOTTING TIME
ACTIVATED CLOTTING TIME: 208 s
ACTIVATED CLOTTING TIME: 197 s
ACTIVATED CLOTTING TIME: 180 s
ACTIVATED CLOTTING TIME: 197 s
ACTIVATED CLOTTING TIME: 175 s
ACTIVATED CLOTTING TIME: 202 s
Activated Clotting Time: 186 seconds
Activated Clotting Time: 208 seconds
Activated Clotting Time: 208 seconds
Activated Clotting Time: 197 seconds
Activated Clotting Time: 197 seconds
Activated Clotting Time: 180 seconds
Activated Clotting Time: 197 seconds
Activated Clotting Time: 180 seconds
Activated Clotting Time: 186 seconds
Activated Clotting Time: 175 seconds
Activated Clotting Time: 202 seconds
Activated Clotting Time: 202 seconds

## 2018-11-18 LAB — PROTIME-INR
INR: 1.89
Prothrombin Time: 21.5 seconds — ABNORMAL HIGH (ref 11.4–15.2)

## 2018-11-18 LAB — APTT: aPTT: 134 seconds — ABNORMAL HIGH (ref 24–36)

## 2018-11-18 MED ORDER — NOREPINEPHRINE BITARTRATE 1 MG/ML IV SOLN
0.0000 ug/min | INTRAVENOUS | Status: DC
  Administered 2018-11-18: 6 ug/min via INTRAVENOUS
  Filled 2018-11-18: qty 16

## 2018-11-18 MED ORDER — POTASSIUM CHLORIDE 10 MEQ/50ML IV SOLN
10.0000 meq | INTRAVENOUS | Status: AC
  Administered 2018-11-18 (×4): 10 meq via INTRAVENOUS
  Filled 2018-11-18 (×4): qty 50

## 2018-11-18 MED ORDER — HEPARIN SODIUM (PORCINE) 1000 UNIT/ML DIALYSIS: 1000.0000 [IU] | INTRAMUSCULAR | Status: DC | PRN

## 2018-11-18 MED ORDER — VITAL AF 1.2 CAL PO LIQD: 1000.0000 mL | ORAL | Status: DC

## 2018-11-18 MED ORDER — NOREPINEPHRINE 16 MG/250ML-% IV SOLN: 0.0000 ug/min | INTRAVENOUS | Status: DC

## 2018-11-18 MED ORDER — PRISMASOL BGK 4/2.5 32-4-2.5 MEQ/L REPLACEMENT SOLN
Status: DC
  Administered 2018-11-19 – 2018-11-24 (×9): via INTRAVENOUS_CENTRAL
  Filled 2018-11-18 (×12): qty 5000

## 2018-11-18 MED ORDER — PRISMASOL BGK 4/2.5 32-4-2.5 MEQ/L REPLACEMENT SOLN
Status: DC
  Administered 2018-11-18 – 2018-11-24 (×21): via INTRAVENOUS_CENTRAL
  Filled 2018-11-18 (×33): qty 5000

## 2018-11-18 MED ORDER — VITAL AF 1.2 CAL PO LIQD
1000.0000 mL | ORAL | Status: DC
  Administered 2018-11-18 – 2018-11-25 (×7): 1000 mL
  Filled 2018-11-18 (×2): qty 1000

## 2018-11-18 MED ORDER — NOREPINEPHRINE 16 MG/250ML-% IV SOLN
0.0000 ug/min | INTRAVENOUS | Status: DC
  Administered 2018-11-18: 6 ug/min via INTRAVENOUS
  Filled 2018-11-18: qty 250

## 2018-11-18 MED ORDER — SODIUM CHLORIDE 0.9 % FOR CRRT: INTRAVENOUS_CENTRAL | Status: DC | PRN

## 2018-11-18 MED ORDER — PRISMASOL BGK 4/2.5 32-4-2.5 MEQ/L IV SOLN
INTRAVENOUS | Status: DC
  Administered 2018-11-18 – 2018-11-24 (×54): via INTRAVENOUS_CENTRAL
  Filled 2018-11-18 (×79): qty 5000

## 2018-11-18 NOTE — Progress Notes (Signed)
Nutrition Follow-up  DOCUMENTATION CODES:   Not applicable  INTERVENTION:    Increase Vital AF 1.2 rate slowly by 10 ml every 12 hours to goal rate of 65 ml/h with Pro-stat 30 ml BID to provide 2072 kcal, 147 gm protein, 1265 ml free water daily  NUTRITION DIAGNOSIS:   Inadequate oral intake related to inability to eat as evidenced by NPO status.  Ongoing  GOAL:   Patient will meet greater than or equal to 90% of their needs  Progressing  MONITOR:   Vent status, TF tolerance, Labs, Skin, I & O's  ASSESSMENT:   67 y/o male PMHx htn/hld, dm2. Presented w/ abd pain that began suddenly 1 day PTA. Supportive care from PCP ineffective and presented to ED w/ vomiting. Determined to have severe gallstone pancreatitis w/ related AKI. Decompensated after arrival, developing  worsening renal failure. On 11/22 transfered to ICU and required intubation. Begun on pressors and CRRT.    Fluoroscopy team replaced Cortrak tube yesterday in the left nare. Tip is at the ligament of Treitz. Trickle TF was initiated yesterday: Vital AF 1.2 at 20 ml/h is providing 576 kcal, 36 gm protein, 389 ml free water daily.   OGT is in the right nare, 750 ml output 12/10, 150 ml output so far this morning.   Femoral HD catheter placed 12/6. CRRT continues.  Patient remains intubated on ventilator support MV: 14.9 L/min Temp (24hrs), Avg:98.3 F (36.8 C), Min:98.1 F (36.7 C), Max:98.6 F (37 C)   Labs reviewed. Sodium 134 (L), potassium 3 (L) phosphorus 3.7 WNL CBG's: 140-137 Medications reviewed and include folic acid, novolog, MVI, thiamine, KCl, levophed.  Weight is trending down with diuresis.   Diet Order:   Diet Order            Diet NPO time specified  Diet effective now              EDUCATION NEEDS:   No education needs have been identified at this time  Skin:  Skin Assessment: Skin Integrity Issues: Skin Integrity Issues:: DTI DTI: head  Last BM:  12/6 type 6  Height:    Ht Readings from Last 1 Encounters:  11/06/18 6' (1.829 m)    Weight:   Wt Readings from Last 1 Encounters:  11/18/18 82.2 kg    Ideal Body Weight:  80.91 kg  BMI:  Body mass index is 24.58 kg/m.  Estimated Nutritional Needs:   Kcal:  2050  Protein:  140-180 gm  Fluid:  2.1-2.3 L    Molli Barrows, RD, LDN, CNSC Pager [phone] After Hours Pager [phone]

## 2018-11-18 NOTE — Progress Notes (Signed)
Patient on 35% trach collar at this time. Patient is tolerating well. Will continue to monitor.

## 2018-11-18 NOTE — Progress Notes (Signed)
Pharmacy Antibiotic Note  Derek Riley is a 67 y.o. male admitted on 10/25/2018 with necrotizing pancreatitis.  Pharmacy has been consulted to dose meropenem and anidulafungin dosing. Of note, patient completed a short course of Meropenem and anidulafungin recently which was stopped by ID.   Patient remains on CRRT.  Afebrile, WBC improved to 46.7.  Plan: Merrem 1gm IV Q12H Eraxis 100 mg IV Q24H  Monitor CRRT tolerance/interruptions, clinical progress, abx LOT  Height: 6' (182.9 cm) Weight: 181 lb 3.5 oz (82.2 kg) IBW/kg (Calculated) : 77.6  Temp (24hrs), Avg:98.2 F (36.8 C), Min:97.8 F (36.6 C), Max:98.6 F (37 C)  Recent Labs  Lab 11/14/18 1222  11/15/18 0816  11/16/18 0354 11/16/18 1229 11/16/18 1611 11/17/18 0516 11/17/18 1624 11/18/18 0304  WBC 54.7*  --  45.7*  --  75.6*  --   --  53.6*  --  46.7*  CREATININE  --    < >  --    < > 2.36* 2.18* 2.06* 1.96* 1.97* 1.93*   < > = values in this interval not displayed.    Estimated Creatinine Clearance: 40.8 mL/min (A) (by C-G formula based on SCr of 1.93 mg/dL (H)).    No Known Allergies   Zosyn 11/20 >> 11/26 Merrem 11/26 >> 12/4, restarted 12/8 >> Eraxis 11/30>>12/4, restarted 12/8 >>  11/23 resp cx: C.albicans  11/23 blood cx: NEG 11/22 mrsa pcr: neg 11/30 cdiff pcr: neg 11/30 BCx: negative 12/8 BCx - 1 of 2 CoNS (BCID Staph, Mec A+, likely a contaminant) 12/8 fungus cx, blood - NGTD   Steffany Schoenfelder D. Mina Marble, PharmD, BCPS, [place] 11/18/2018, 10:28 AM

## 2018-11-18 NOTE — Plan of Care (Signed)
  Problem: Health Behavior/Discharge Planning: Goal: Ability to manage health-related needs will improve Outcome: Progressing   Problem: Activity: Goal: Risk for activity intolerance will decrease Outcome: Progressing   Problem: Clinical Measurements: Goal: Ability to maintain clinical measurements within normal limits will improve Outcome: Progressing Goal: Will remain free from infection Outcome: Progressing Goal: Diagnostic test results will improve Outcome: Progressing Goal: Respiratory complications will improve Outcome: Progressing Goal: Cardiovascular complication will be avoided Outcome: Progressing   Problem: Nutrition: Goal: Adequate nutrition will be maintained Outcome: Progressing   Problem: Coping: Goal: Level of anxiety will decrease Outcome: Progressing   Problem: Elimination: Goal: Will not experience complications related to bowel motility Outcome: Progressing   Problem: Pain Managment: Goal: General experience of comfort will improve Outcome: Progressing   Problem: Safety: Goal: Ability to remain free from injury will improve Outcome: Progressing   Problem: Skin Integrity: Goal: Risk for impaired skin integrity will decrease Outcome: Progressing   Problem: Activity: Goal: Ability to tolerate increased activity will improve Outcome: Progressing   Problem: Respiratory: Goal: Ability to maintain a clear airway and adequate ventilation will improve Outcome: Progressing   Problem: Role Relationship: Goal: Method of communication will improve Outcome: Progressing   Problem: Education: Goal: Knowledge of disease and its progression will improve Outcome: Progressing   Problem: Health Behavior/Discharge Planning: Goal: Ability to manage health-related needs will improve Outcome: Progressing   Problem: Clinical Measurements: Goal: Complications related to the disease process or treatment will be avoided or minimized Outcome: Progressing Goal:  Dialysis access will remain free of complications Outcome: Progressing   Problem: Activity: Goal: Activity intolerance will improve Outcome: Progressing   Problem: Fluid Volume: Goal: Fluid volume balance will be maintained or improved Outcome: Progressing   Problem: Nutritional: Goal: Ability to make appropriate dietary choices will improve Outcome: Progressing   Problem: Respiratory: Goal: Respiratory symptoms related to disease process will be avoided Outcome: Progressing   Problem: Self-Concept: Goal: Body image disturbance will be avoided or minimized Outcome: Progressing     Tolerating TF initiation w/o vomiting. Decreasing pressor requirement. Tolerating fluid removal via CRRT.

## 2018-11-18 NOTE — Progress Notes (Signed)
[facility] ROUNDING NOTE   Subjective:   Resting comfortably.  Appears to be somewhat better awake alert this morning admitted 10/24/2018 with necrotizing pancreatitis.  Being treated with meropenem 1 g every 12 hours and anidulafungin.  100 mg daily.  Dosed by pharmacy.  Continues on CRRT  Blood pressure 110/34 pulse 68 temperature 98.1 O2 sats 100% FiO2 40% ventilator  IV pressors                      Norepinephrine decreased dose to 27.5.  Removal of 4.7 L  Sodium 134 potassium 3.0 chloride 98 CO2 23 BUN 41 creatinine 1.93 glucose 152 phosphorus 3.7 magnesium 2.7 Albumin 1.8 WBC 46.7 hemoglobin 8.1 platelets 309 INR 1.89  Objective:  Vital signs in last 24 hours:  Temp:  [98.1 F (36.7 C)-98.6 F (37 C)] 98.1 F (36.7 C) (12/11 0721) Pulse Rate:  [59-75] 68 (12/11 1000) Resp:  [13-25] 13 (12/11 1000) BP: (77-116)/(34-63) 101/51 (12/11 1000) SpO2:  [91 %-100 %] 99 % (12/11 1000) Arterial Line BP: (63-146)/(20-50) 110/34 (12/11 1000) FiO2 (%):  [40 %] 40 % (12/11 0946) Weight:  [82.2 kg] 82.2 kg (12/11 0300)  Weight change: -3 kg Filed Weights   11/16/18 0417 11/17/18 0500 11/18/18 0300  Weight: 87.6 kg 85.2 kg 82.2 kg    Intake/Output: I/O last 3 completed shifts: In: 2880.4 [I.V.:1650.7; Blood:325; Other:20; NG/GT:444.7; IV Piggyback:440] Out: 4166 [Emesis/NG output:1060; Other:6571]   Intake/Output this shift:  Total I/O In: 349.7 [I.V.:42.5; Other:21; NG/GT:95; IV Piggyback:191.2] Out: 063 [Emesis/NG output:250; Other:613]   Jaundiced appearing awakens to voice CVS- RRR no rubs RS- CTA tracheostomy without drainage ABD-soft distended nonrigid EXT-right femoral non-tunneled tunneled hemodialysis catheter  3+ diffuse anasarca   Basic Metabolic Panel: Recent Labs  Lab 11/14/18 0500  11/15/18 0324  11/16/18 0354 11/16/18 1229 11/16/18 1611 11/17/18 0516 11/17/18 1624 11/18/18 0304  NA 137   < > 133*   < > 132* 131* 132* 133* 133* 134*   K 4.2   < > 4.5   < > 5.7* 5.2* 5.4* 4.1 3.7 3.0*  CL 90*   < > 84*   < > 94* 94* 92* 96* 96* 98  CO2 28   < > 30   < > 17* 20* 22 21* 21* 23  GLUCOSE 92   < > 194*   < > 72 133* 61* 128* 155* 152*  BUN 104*   < > 62*   < > 41* 40* 42* 42* 40* 41*  CREATININE 4.22*   < > 2.84*   < > 2.36* 2.18* 2.06* 1.96* 1.97* 1.93*  CALCIUM 7.9*   < > 8.8*   < > 7.8* 7.7* 7.6* 7.4* 7.4* 7.6*  MG 2.2  --  1.9  --  2.4  --   --  2.6*  --  2.7*  PHOS 4.6   < > 4.4   < > 6.1* 5.4* 5.2* 5.0* 4.4 3.7   < > = values in this interval not displayed.    Liver Function Tests: Recent Labs  Lab 11/11/18 1453 11/12/18 0525  11/13/18 1300  11/16/18 0354 11/16/18 1229 11/16/18 1611 11/17/18 0516 11/17/18 1624 11/18/18 0304  AST 168* 256*  --  110*  --  168*  --   --   --   --   --   ALT 26 45*  --  38  --  49*  --   --   --   --   --  ALKPHOS 193* 129*  --  128*  --  169*  --   --   --   --   --   BILITOT 4.5* 4.4*  --  5.2*  --  7.0*  --   --   --   --   --   PROT 7.8 6.8  --  6.1*  --  7.5  --   --   --   --   --   ALBUMIN 1.3*  1.3* 1.2*   < > 1.1*   < > 2.2* 2.1* 2.1* 1.9* 1.8* 1.8*   < > = values in this interval not displayed.   Recent Labs  Lab 11/16/18 0354  LIPASE 100*  AMYLASE 118*   No results for input(s): AMMONIA in the last 168 hours.  CBC: Recent Labs  Lab 11/14/18 1222  11/15/18 0816 11/16/18 0354 11/17/18 0516 11/17/18 1200 11/18/18 0304  WBC 54.7*  --  45.7* 75.6* 53.6*  --  46.7*  NEUTROABS 41.6*  --  34.4* 60.5*  --   --   --   HGB 7.9*   < > 7.4* 7.3* 6.7* 7.8* 8.1*  HCT 24.6*   < > 22.7* 23.6* 21.1* 25.1* 24.2*  MCV 87.5  --  88.3 91.5 88.7  --  89.0  PLT 216  --  218 320 335  --  309   < > = values in this interval not displayed.    Cardiac Enzymes: Recent Labs  Lab 11/11/18 2324 11/12/18 0526 11/12/18 1235  TROPONINI <0.03 <0.03 <0.03    BNP: Invalid input(s): POCBNP  CBG: Recent Labs  Lab 11/17/18 1533 11/17/18 1958 11/17/18 2341  11/18/18 0338 11/18/18 0803  GLUCAP 149* 149* 164* 140* 14*    Microbiology: Results for orders placed or performed during the hospital encounter of 10/31/2018  MRSA PCR Screening     Status: None   Collection Time: 10/30/18  4:23 PM  Result Value Ref Range Status   MRSA by PCR NEGATIVE NEGATIVE Final    Comment:        The GeneXpert MRSA Assay (FDA approved for NASAL specimens only), is one component of a comprehensive MRSA colonization surveillance program. It is not intended to diagnose MRSA infection nor to guide or monitor treatment for MRSA infections. Performed at [facility], [place] [address] [place], [place] [address]   Culture, blood (Routine X 2) w Reflex to ID Panel     Status: None   Collection Time: 10/31/18 10:22 AM  Result Value Ref Range Status   Specimen Description BLOOD RIGHT ARM  Final   Special Requests   Final    BOTTLES DRAWN AEROBIC ONLY Blood Culture adequate volume   Culture   Final    NO GROWTH 5 DAYS Performed at [facility], [address], [place], [place] [address]    Report Status 11/05/2018 FINAL  Final  Culture, blood (Routine X 2) w Reflex to ID Panel     Status: None   Collection Time: 10/31/18 10:44 AM  Result Value Ref Range Status   Specimen Description BLOOD RIGHT HAND  Final   Special Requests   Final    BOTTLES DRAWN AEROBIC ONLY Blood Culture results may not be optimal due to an inadequate volume of blood received in culture bottles   Culture   Final    NO GROWTH 5 DAYS Performed at [facility], [place] [address], [place], [place] [address]  Report Status 11/05/2018 FINAL  Final  Culture, respiratory (non-expectorated)     Status: None   Collection Time: 10/31/18 11:13 AM  Result Value Ref Range Status   Specimen Description TRACHEAL ASPIRATE  Final   Special Requests NONE  Final   Gram Stain   Final    MODERATE WBC PRESENT,BOTH PMN AND MONONUCLEAR RARE YEAST Performed at [facility], [place] [address], [place], [place] [address]    Culture FEW CANDIDA ALBICANS  Final   Report Status 11/02/2018 FINAL  Final  C difficile quick scan w PCR reflex     Status: None   Collection Time: 11/07/18  9:35 AM  Result Value Ref Range Status   C Diff antigen NEGATIVE NEGATIVE Final   C Diff toxin NEGATIVE NEGATIVE Final   C Diff interpretation No C. difficile detected.  Final    Comment: Performed at [facility], [place] [address], [place], [place] [address]  Culture, blood (routine x 2)     Status: None   Collection Time: 11/07/18  5:46 PM  Result Value Ref Range Status   Specimen Description BLOOD BLOOD RIGHT HAND  Final   Special Requests   Final    BOTTLES DRAWN AEROBIC ONLY Blood Culture results may not be optimal due to an inadequate volume of blood received in culture bottles   Culture   Final    NO GROWTH 5 DAYS Performed at [facility], [place] [address], [place], [place] [address]    Report Status 11/12/2018 FINAL  Final  Culture, blood (routine x 2)     Status: None   Collection Time: 11/07/18  5:46 PM  Result Value Ref Range Status   Specimen Description BLOOD BLOOD LEFT HAND  Final   Special Requests   Final    BOTTLES DRAWN AEROBIC ONLY Blood Culture adequate volume   Culture   Final    NO GROWTH 5 DAYS Performed at [facility], [place] [address], [place], [place] [address]    Report Status 11/12/2018 FINAL  Final  Culture, blood (routine x 2)     Status: Abnormal (Preliminary result)   Collection Time: 11/15/18  1:12 PM  Result Value Ref Range Status   Specimen Description BLOOD SITE NOT SPECIFIED  Final   Special Requests AEROBIC BOTTLE ONLY Blood Culture adequate volume  Final   Culture  Setup Time   Final    GRAM POSITIVE COCCI AEROBIC BOTTLE ONLY CRITICAL RESULT CALLED TO, READ BACK BY AND VERIFIED WITH: Reece Packer PharmD 17:35 11/16/18 ([email or web address])    Culture (A)  Final    STAPHYLOCOCCUS SPECIES (COAGULASE NEGATIVE) THE SIGNIFICANCE OF ISOLATING THIS  ORGANISM FROM A SINGLE SET OF BLOOD CULTURES WHEN MULTIPLE SETS ARE DRAWN IS UNCERTAIN. PLEASE NOTIFY THE MICROBIOLOGY DEPARTMENT WITHIN ONE WEEK IF SPECIATION AND SENSITIVITIES ARE REQUIRED. Performed at [facility], [place] [address], [place], [place] [address]    Report Status PENDING  Incomplete  Fungus culture, blood     Status: None (Preliminary result)   Collection Time: 11/15/18  1:12 PM  Result Value Ref Range Status   Specimen Description BLOOD BLOOD RIGHT HAND  Final   Special Requests AEROBIC BOTTLE ONLY Blood Culture adequate volume  Final   Culture   Final    NO GROWTH 2 DAYS Performed at [facility], [place] [address] [place], [place] [address]    Report Status PENDING  Incomplete  Blood Culture ID Panel (  Reflexed)     Status: Abnormal   Collection Time: 11/15/18  1:12 PM  Result Value Ref Range Status   Enterococcus species NOT DETECTED NOT DETECTED Final   Listeria monocytogenes NOT DETECTED NOT DETECTED Final   Staphylococcus species DETECTED (A) NOT DETECTED Final    Comment: Methicillin (oxacillin) resistant coagulase negative staphylococcus. Possible blood culture contaminant (unless isolated from more than one blood culture draw or clinical case suggests pathogenicity). No antibiotic treatment is indicated for blood  culture contaminants. CRITICAL RESULT CALLED TO, READ BACK BY AND VERIFIED WITH: Reece Packer PharmD 17:35 11/16/18 ([email or web address])    Staphylococcus aureus (BCID) NOT DETECTED NOT DETECTED Final   Methicillin resistance DETECTED (A) NOT DETECTED Final    Comment: CRITICAL RESULT CALLED TO, READ BACK BY AND VERIFIED WITH: Reece Packer PharmD 17:35 11/16/18 ([email or web address])    Streptococcus species NOT DETECTED NOT DETECTED Final   Streptococcus agalactiae NOT DETECTED NOT DETECTED Final   Streptococcus pneumoniae NOT DETECTED NOT DETECTED Final   Streptococcus pyogenes NOT DETECTED NOT DETECTED Final   Acinetobacter baumannii NOT DETECTED NOT DETECTED Final    Enterobacteriaceae species NOT DETECTED NOT DETECTED Final   Enterobacter cloacae complex NOT DETECTED NOT DETECTED Final   Escherichia coli NOT DETECTED NOT DETECTED Final   Klebsiella oxytoca NOT DETECTED NOT DETECTED Final   Klebsiella pneumoniae NOT DETECTED NOT DETECTED Final   Proteus species NOT DETECTED NOT DETECTED Final   Serratia marcescens NOT DETECTED NOT DETECTED Final   Haemophilus influenzae NOT DETECTED NOT DETECTED Final   Neisseria meningitidis NOT DETECTED NOT DETECTED Final   Pseudomonas aeruginosa NOT DETECTED NOT DETECTED Final   Candida albicans NOT DETECTED NOT DETECTED Final   Candida glabrata NOT DETECTED NOT DETECTED Final   Candida krusei NOT DETECTED NOT DETECTED Final   Candida parapsilosis NOT DETECTED NOT DETECTED Final   Candida tropicalis NOT DETECTED NOT DETECTED Final    Comment: Performed at [facility], [place]. [address], [place], [place] [address]  Culture, blood (routine x 2)     Status: None (Preliminary result)   Collection Time: 11/15/18  1:13 PM  Result Value Ref Range Status   Specimen Description BLOOD BLOOD RIGHT HAND  Final   Special Requests IN PEDIATRIC BOTTLE Blood Culture adequate volume  Final   Culture   Final    NO GROWTH 2 DAYS Performed at [facility], [place] [address] [place], [place] [address]    Report Status PENDING  Incomplete  Fungus culture, blood     Status: None (Preliminary result)   Collection Time: 11/15/18  1:13 PM  Result Value Ref Range Status   Specimen Description BLOOD BLOOD RIGHT HAND  Final   Special Requests IN PEDIATRIC BOTTLE Blood Culture adequate volume  Final   Culture   Final    NO GROWTH 2 DAYS Performed at [facility], [place] [address], [place], [place] [address]    Report Status PENDING  Incomplete    Coagulation Studies: Recent Labs    11/18/18 0304  LABPROT 21.5*  INR 1.89    Urinalysis: No results for input(s): COLORURINE, LABSPEC, PHURINE, GLUCOSEU, HGBUR,  BILIRUBINUR, KETONESUR, PROTEINUR, UROBILINOGEN, NITRITE, LEUKOCYTESUR in the last 72 hours.  Invalid input(s): APPERANCEUR    Imaging: Dg Abd 1 View  Result Date: 11/17/2018 CLINICAL DATA:  Assess feeding tube placement. EXAM: ABDOMEN - 1 VIEW COMPARISON:  KUB of November 15, 2018 FINDINGS: The feeding tube tip projects at the level of the ligament  of Treitz at the origin of the jejunum. There is contrast within the third and fourth portions of the duodenum and in the proximal jejunum. Reportedly a total of 14 cc of Isovue-300 was used to confirm placement with subsequent flushing of the tube. Reported fluoro time is 5 minutes, 18 seconds. IMPRESSION: The Cortrac feeding tube appears to be in reasonable position at the origin of the jejunum. Electronically Signed   By: David  Martinique M.D.   On: 11/17/2018 16:55   Dg Chest Port 1 View  Result Date: 11/18/2018 CLINICAL DATA:  Acute respiratory failure with hypoxia. EXAM: PORTABLE CHEST 1 VIEW COMPARISON:  Radiograph November 09, 2018. FINDINGS: Stable cardiomediastinal silhouette. Tracheostomy and feeding tubes are unchanged in position. Left internal jugular catheter is unchanged in position. Right internal jugular catheter has been removed. No pneumothorax is noted. Stable bibasilar subsegmental atelectasis or infiltrates are noted. Small left pleural effusion may be present. Bony thorax is unremarkable. IMPRESSION: Bibasilar opacities as described above. Stable support apparatus. Possible small left pleural effusion. Electronically Signed   By: Marijo Conception, M.D.   On: 11/18/2018 07:15     Medications:   .  prismasol BGK 4/2.5 800 mL/hr at 11/18/18 0923  .  prismasol BGK 4/2.5 Stopped (11/18/18 0925)  . sodium chloride    . sodium chloride Stopped (11/18/18 0952)  . anidulafungin Stopped (11/17/18 1747)  . heparin 10,000 units/ 20 mL infusion syringe 1,950 Units/hr (11/18/18 0832)  . meropenem (MERREM) IV Stopped (11/18/18 2330)  .  norepinephrine 9.5 mcg/min (11/18/18 1000)  . potassium chloride 50 mL/hr at 11/18/18 1000  . prismasol BGK 4/2.5 2,000 mL/hr at 11/18/18 1024  . sodium chloride     . chlorhexidine gluconate (MEDLINE KIT)  15 mL Mouth Rinse BID  . Chlorhexidine Gluconate Cloth  6 each Topical Q0600  . feeding supplement (PRO-STAT SUGAR FREE 64)  30 mL Per Tube BID  . feeding supplement (VITAL AF 1.2 CAL)  1,000 mL Per Tube Q24H  . folic acid  1 mg Oral Daily  . HYDROmorphone  1 mg Per Tube Q4H  . insulin aspart  0-9 Units Subcutaneous Q4H  . mouth rinse  15 mL Mouth Rinse 10 times per day  . multivitamin  15 mL Oral Daily  . mupirocin ointment  1 application Nasal BID  . pantoprazole (PROTONIX) IV  40 mg Intravenous Daily  . sodium chloride flush  10-40 mL Intracatheter Q12H  . thiamine injection  100 mg Intravenous Daily   Place/Maintain arterial line **AND** sodium chloride, sodium chloride, acetaminophen, fentaNYL, heparin, heparin, HYDROmorphone (DILAUDID) injection, midazolam, ondansetron (ZOFRAN) IV, sodium chloride, sodium chloride flush  Assessment/ Plan:   Acute kidney injury from ischemic ATN CRRT discontinued 11/04/2018 but was restarted 11/07/2018.  11/13/2018 insertion of femoral dialysis catheter.  Using citrate dialysate anticoagulation.  Septic shock with necrotizing pancreatitis patient is back on meropenem and Eraxis per critical care with increasing white count.  Blood cultures negative.  CT scan 11/12/2018 with contrast necrosis on more organized fluid collections.   Acute hypoxic respiratory failure status post trach 11/08/2018 continues with hypoxia requiring supplemental oxygen  Hypocalcemia 2.5 calcium bath this corrects for hypoalbuminemia  Anemia transfuse as needed  Nutrition status post TPN has GJ tube placed.  Possible pericarditis with TEE revealing trivial pericardial effusion no heparin CRRT  Temporary hemodialysis catheter removed 12/3 reinserted  11/13/2018  Leukemoid reaction increase white count had received stress dose steroids  Hypokalemia we will add back potassium to dialysate  and will run 4 x 10 mEq IV KCl   metabolic acidosis resolved  DNR status.  Continue full measures.    LOS: [place] '@TODAY''@10'$ :41 AM

## 2018-11-18 NOTE — Consult Note (Signed)
[facility] Nurse wound consult note Reason for Consult:MDRPI forehead DTI, stage 2 Wound type:pressure  Pressure Injury POA: No Measurement:0.4cm round, no depth on right side of forehead,  left side of forehead has two areas. The more lateral DTI is 0.4cm round no depth, the more medial wound is 0.4cm round x 0.1cm that has evolved into a stage 2.  Wound UEK:CMKL Drainage (amount, consistency, odor) none Periwound:intact Dressing procedure/placement/frequency:Wounds assessed. Wounds were caused by oxygenation sensor that is no longer in use. Wounds clean, no treatment needed. [facility] team will assess weekly. If assistance needed prior to next visit, please re-consult. Fara Olden, RN-C, WTA-C, [facility] Wound Treatment Associate Ostomy Care Associate

## 2018-11-18 NOTE — Progress Notes (Signed)
NAME:  Derek Riley, MRN:  [id number], DOB:  27-May-1951, LOS: 21 ADMISSION DATE:  10/24/2018, CONSULTATION DATE:  11/22 REFERRING MD:  11/22, CHIEF COMPLAINT:  Worsening metabolic acidosis and renal failure w/ clinical decline  Brief History   67 year old male admitted 11/20 w/ gallstone pancreatitis. PCCM consulted 11/22 for progressive metabolic acidosis, acute renal failure and persistent clinical decline.   Past Medical History  Diabetes, gallstones, hypertension.  Significant Hospital Events   11/20 transferred to [facility] from [place] w/ acute gallstone pancreatitis. GI & surgicals services consulted. Started On supportive IVFs, empiric zosyn and analgesic support.  Surgical service is planning on cholecystectomy once pancreatitis improved.  Initial lipase on arrival to [place], creatinine 1.72 11/21: MRCP 11/21 showing extensive pancreatitis, possible hemorrhagic peripancreatic fluid no choledocholithiasis, lipase 3361, creatinine up to 4.91, anion gap 13.  No role for ERCP continued supportive care 11/22: Creatinine up to 6.95, gap now 16, progressive diffuse mottling, worse pain, critical care consulted for shock, and progressive multiorgan failure, moving to intensive care for CRRT as well as hemodynamic support. 3.7 liters + since admit, developed worsening resp failure requiring intubation shortly after arrival. Possible aspiration during intubation  11/26 started on meropenem (d/t rising CBC) CRRT stopped 11/27 extubated, pressors off.  11/28 look weaker. WIll probably need re-intubation. 11/29 reintubated for increasing WOB, likely aspiration even overnight.  11/30: Remains intubated.  White blood cell count now up to 62,000.  CRRT resumed 12/1: No significant change.  Ongoing cuff leak opting for early tracheostomy versus endotracheal tube change.  12/2: tolerating PS wean at 10/5, CT of the abdomen/pelvis ordered 12/6: Initiation of CRRT to remove significant  cumulative fluid balance.  Consults:  11/20 GI and surgical services 11/22 critical care  Procedures:  11/22 Foley catheter 11/22 right IJ CVL (HD)>>> 11/22 OETT 11/22>>>12/1 Trach 12/1 (JY)>>>  Significant Diagnostic Tests:  MRCP 11/21: Acute pancreatitis with extensive pancreatic and periPancreatic inflammatory changes, including proteinaceous and/or hemorrhagic peri-pancreatic fluid.  No choledocholithiasis or biliary tract obstruction.  Small volume of ascites, trace bilateral pleural effusions.  Bilateral basilar volume loss  CT abdomen/pelvis 10/9>> persistent peripancreatic edema and fluid, slightly more organized, small area of apparent pancreatic necrosis, no intra-or extrahepatic biliary ductal dilatation, small bilateral pleural effusions with lower lobe atelectatic change  Micro Data:  Cultures negative  Antimicrobials:  Zosyn 11/20>>>11/26 Meropenem 11/26>>> 11/11/2018 Anidulafungin 12/8 >>  Meropenem 12/8 >>   Interim history/subjective:  Pressors are weaning Wean from ventilator as tolerated   Objective   Blood pressure (!) 99/57, pulse 66, temperature 98.1 F (36.7 C), temperature source Oral, resp. rate 17, height 6' (1.829 m), weight 82.2 kg, SpO2 100 %. CVP:  [8 mmHg] 8 mmHg  Vent Mode: PSV;CPAP FiO2 (%):  [40 %] 40 % Set Rate:  [18 bmp] 18 bmp Vt Set:  [620 mL] 620 mL PEEP:  [5 cmH20] 5 cmH20 Pressure Support:  [10 cmH20] 10 cmH20 Plateau Pressure:  [12 cmH20-23 cmH20] 19 cmH20   Intake/Output Summary (Last 24 hours) at 11/18/2018 1147 Last data filed at 11/18/2018 1132 Gross per 24 hour  Intake 1833.05 ml  Output 5815 ml  Net -3981.95 ml   Filed Weights   11/16/18 0417 11/17/18 0500 11/18/18 0300  Weight: 87.6 kg 85.2 kg 82.2 kg    Examination: General: Ill-appearing male in no acute distress HEENT: Sclerae jaundiced.  Pupils equal round Neuro: Follows commands moves all extremities CV: Sounds are regular PULM: even/non-labored, lungs  bilaterally  GI: Faint bowel sounds, tolerates palpation Extremities: warm/dry, 2+ edema  Skin: no rashes or lesions, jaundice     Resolved Hospital Problem list     Assessment & Plan:   Acute gallstone pancreatitis development of pancreatic necrosis no obvious abscess or cystic formation. -No evidence of choledocholelithiasis on MRCP -CT abdomen reassuring 12/9 Plan Continue Coumadin therapy Tolerating tube feeds  Shock.  Suspect distributive, in part related to volume removal with CVVH. Wean pressors as tolerated    Ileus Currently tolerating tube feeding  Ongoing leukocytosis in setting of pancreatic inflammation Plan Continues to improve  Acute hypoxic respiratory failure requiring mechanical ventilation in the setting of acute aspiration event 11/29, progressive atelectasis, and decreased abdominal compliance from pain and impaired cough status post tracheostomy currently requiring ventilatory support. Plan Wean as tolerated Trach still has sutures  Severe deconditioning and protein calorie malnutrition Plan Feeding tube placement  Acute kidney injury with baseline creatinine 1.  This was hemodynamically mediated plus/minus contrast-induced nephropathy Plan Nephrology following CVVH per nephrology  Hyperglycemia, then also periods of hypoglycemia Plan Sliding scale insulin protocol  Elevated cumulative fluid balance and electrolyte imbalance: Hyperkalemia Plan Currently on CVVH Replacement per renal  App CCT 40 min   Richardson Landry Minor ACNP Maryanna Shape PCCM Pager [phone] till 1 pm If no answer page [phone] 11/18/2018, 11:47 AM

## 2018-11-18 NOTE — Progress Notes (Signed)
[place] [facility] Progress Note Patient Name: [name] DOB: 04-27-1951 MRN: [id number]   Date of Service  11/18/2018  HPI/Events of Note  Pt is on CRRT. K noted to be 3.  eICU Interventions  No repletion.      Intervention Category Minor Interventions: Electrolytes abnormality - evaluation and management  Elsie Lincoln 11/18/2018, 6:09 AM

## 2018-11-18 NOTE — Evaluation (Signed)
Physical Therapy Evaluation Patient Details Name: Derek Riley MRN: [id number] DOB: 1951/07/05 Today's Date: 11/18/2018   History of Present Illness  67 y.o. male admitted 11/20 with abdominal pain, found to have severe pancreatitis. Intubated 11/21-11/27, re-intubated 11/30 with trach 12/1 remains intubated and on CRRT at time of PT evaluation 11/18/18. PMH includes: HTN, HLD, DM     Clinical Impression  Pt admitted with above diagnosis. Pt currently with functional limitations due to the deficits listed below (see PT Problem List). PTA pt reports independent with all mobility living alone, driving. Today pt extremely deconditioned and weak, tolerating some light bed level therex. Unable to progress bed mobility due to femoral access of CRRT. Will cont to follow and update recs to CIR if patient becomes more appropriate. Pt will benefit from skilled PT to increase their independence and safety with mobility to allow discharge to the venue listed below.    Vitals: HR 75 BP 87/47 (59) ART 102/39 (54) *RN aware and present during visit  RR 16-44 during session.   Vent settings: PRVC 40%FiO2, PEEP  5, RR 16      Follow Up Recommendations Supervision/Assistance - 24 hour;SNF    Equipment Recommendations  (TBD)    Recommendations for Other Services       Precautions / Restrictions Precautions Precautions: Fall Precaution Comments: A Line, Femoral CRRT, trach Restrictions Weight Bearing Restrictions: No      Mobility  Bed Mobility               General bed mobility comments: deferred due to femoral CRRT access, hypotension  Transfers                    Ambulation/Gait                Stairs            Wheelchair Mobility    Modified Rankin (Stroke Patients Only)       Balance Overall balance assessment: (N/T this visit)                                           Pertinent Vitals/Pain Pain Assessment: Faces Faces  Pain Scale: Hurts little more Pain Location: generalized  Pain Descriptors / Indicators: Discomfort;Grimacing Pain Intervention(s): Limited activity within patient's tolerance;Monitored during session    Home Living Family/patient expects to be discharged to:: Private residence Living Arrangements: Alone                    Prior Function Level of Independence: Independent               Hand Dominance        Extremity/Trunk Assessment   Upper Extremity Assessment Upper Extremity Assessment: Generalized weakness    Lower Extremity Assessment Lower Extremity Assessment: Generalized weakness       Communication   Communication: Tracheostomy  Cognition Arousal/Alertness: Awake/alert Behavior During Therapy: WFL for tasks assessed/performed Overall Cognitive Status: Difficult to assess                                        General Comments      Exercises Low Level/ICU Exercises Ankle Circles/Pumps: 20 reps Quad Sets: 10 reps Heel Slides: Left;10 reps Shoulder Flexion: 10 reps Elbow Flexion:  10 reps Shoulder Press: 10 reps   Assessment/Plan    PT Assessment Patient needs continued PT services  PT Problem List Decreased strength;Decreased activity tolerance       PT Treatment Interventions      PT Goals (Current goals can be found in the Care Plan section)  Acute Rehab PT Goals Patient Stated Goal: non stated PT Goal Formulation: With patient Time For Goal Achievement: 12/02/18 Potential to Achieve Goals: Fair    Frequency Min 2X/week   Barriers to discharge        Co-evaluation               AM-PAC PT "6 Clicks" Mobility  Outcome Measure                  End of Session Equipment Utilized During Treatment: (Vent) Activity Tolerance: Patient limited by fatigue Patient left: in bed;with call bell/phone within reach;with nursing/sitter in room Nurse Communication: Mobility status PT Visit Diagnosis:  Unsteadiness on feet (R26.81)    Time: 1710-1740 PT Time Calculation (min) (ACUTE ONLY): 30 min   Charges:   PT Evaluation $PT Eval High Complexity: 1 High PT Treatments $Therapeutic Exercise: 8-22 mins       Reinaldo Berber, PT, DPT Acute Rehabilitation Services Pager: [phone] Office: [place] 11/18/2018, 5:50 PM

## 2018-11-19 ENCOUNTER — Inpatient Hospital Stay (HOSPITAL_COMMUNITY): Payer: Medicare Other

## 2018-11-19 LAB — CBC
HCT: 24.5 % — ABNORMAL LOW (ref 39.0–52.0)
HEMOGLOBIN: 7.9 g/dL — AB (ref 13.0–17.0)
MCH: 29.3 pg (ref 26.0–34.0)
MCHC: 32.2 g/dL (ref 30.0–36.0)
MCV: 90.7 fL (ref 80.0–100.0)
Platelets: 292 10*3/uL (ref 150–400)
RBC: 2.7 MIL/uL — AB (ref 4.22–5.81)
RDW: 21.2 % — ABNORMAL HIGH (ref 11.5–15.5)
WBC: 35.4 10*3/uL — ABNORMAL HIGH (ref 4.0–10.5)
nRBC: 0.2 % (ref 0.0–0.2)

## 2018-11-19 LAB — POCT ACTIVATED CLOTTING TIME
Activated Clotting Time: 136 seconds
Activated Clotting Time: 147 seconds
Activated Clotting Time: 208 seconds
Activated Clotting Time: 213 seconds
Activated Clotting Time: 213 seconds
Activated Clotting Time: 224 seconds
Activated Clotting Time: 224 seconds

## 2018-11-19 LAB — RENAL FUNCTION PANEL
Albumin: 1.7 g/dL — ABNORMAL LOW (ref 3.5–5.0)
Albumin: 1.6 g/dL — ABNORMAL LOW (ref 3.5–5.0)
Anion gap: 11 (ref 5–15)
Anion gap: 10 (ref 5–15)
BUN: 31 mg/dL — ABNORMAL HIGH (ref 8–23)
BUN: 33 mg/dL — ABNORMAL HIGH (ref 8–23)
CO2: 23 mmol/L (ref 22–32)
CO2: 23 mmol/L (ref 22–32)
Calcium: 7.5 mg/dL — ABNORMAL LOW (ref 8.9–10.3)
Calcium: 7.4 mg/dL — ABNORMAL LOW (ref 8.9–10.3)
Chloride: 101 mmol/L (ref 98–111)
Chloride: 101 mmol/L (ref 98–111)
Creatinine, Ser: 1.59 mg/dL — ABNORMAL HIGH (ref 0.61–1.24)
Creatinine, Ser: 1.73 mg/dL — ABNORMAL HIGH (ref 0.61–1.24)
GFR calc Af Amer: 51 mL/min — ABNORMAL LOW (ref >60)
GFR calc Af Amer: 46 mL/min — ABNORMAL LOW (ref >60)
GFR calc non Af Amer: 44 mL/min — ABNORMAL LOW (ref >60)
GFR calc non Af Amer: 40 mL/min — ABNORMAL LOW (ref >60)
GLUCOSE: 174 mg/dL — AB (ref 70–99)
Glucose, Bld: 230 mg/dL — ABNORMAL HIGH (ref 70–99)
Phosphorus: 2.5 mg/dL (ref 2.5–4.6)
Phosphorus: 3.1 mg/dL (ref 2.5–4.6)
Potassium: 3.8 mmol/L (ref 3.5–5.1)
Potassium: 4 mmol/L (ref 3.5–5.1)
Sodium: 135 mmol/L (ref 135–145)
Sodium: 134 mmol/L — ABNORMAL LOW (ref 135–145)

## 2018-11-19 LAB — APTT: aPTT: 179 seconds (ref 24–36)

## 2018-11-19 LAB — GLUCOSE, CAPILLARY
Glucose-Capillary: 147 mg/dL — ABNORMAL HIGH (ref 70–99)
Glucose-Capillary: 160 mg/dL — ABNORMAL HIGH (ref 70–99)
Glucose-Capillary: 165 mg/dL — ABNORMAL HIGH (ref 70–99)
Glucose-Capillary: 167 mg/dL — ABNORMAL HIGH (ref 70–99)
Glucose-Capillary: 171 mg/dL — ABNORMAL HIGH (ref 70–99)
Glucose-Capillary: 197 mg/dL — ABNORMAL HIGH (ref 70–99)

## 2018-11-19 LAB — MAGNESIUM: Magnesium: 2.6 mg/dL — ABNORMAL HIGH (ref 1.7–2.4)

## 2018-11-19 MED ORDER — HEPARIN (PORCINE) 2000 UNITS/L FOR CRRT
INTRAVENOUS_CENTRAL | Status: DC | PRN
  Administered 2018-11-20: 07:00:00 via INTRAVENOUS_CENTRAL

## 2018-11-19 MED ORDER — HYDROMORPHONE HCL 2 MG PO TABS
1.0000 mg | ORAL_TABLET | Freq: Four times a day (QID) | ORAL | Status: DC
  Administered 2018-11-19 – 2018-11-20 (×2): 1 mg
  Filled 2018-11-19 (×2): qty 1

## 2018-11-19 MED ORDER — HEPARIN BOLUS VIA INFUSION (CRRT)
1000.0000 [IU] | INTRAVENOUS | Status: DC | PRN
  Administered 2018-11-19 (×4): 1000 [IU] via INTRAVENOUS_CENTRAL
  Filled 2018-11-19: qty 1000

## 2018-11-19 MED ORDER — HYDROMORPHONE HCL 1 MG/ML IJ SOLN
0.5000 mg | INTRAMUSCULAR | Status: DC | PRN
  Administered 2018-11-19 – 2018-11-24 (×5): 0.5 mg via INTRAVENOUS
  Filled 2018-11-19 (×5): qty 0.5

## 2018-11-19 MED ORDER — SODIUM CHLORIDE 0.9 % IV SOLN
250.0000 [IU]/h | INTRAVENOUS | Status: DC
  Administered 2018-11-19: 550 [IU]/h via INTRAVENOUS_CENTRAL
  Administered 2018-11-19: 450 [IU]/h via INTRAVENOUS_CENTRAL
  Administered 2018-11-19: 250 [IU]/h via INTRAVENOUS_CENTRAL
  Administered 2018-11-19: 950 [IU]/h via INTRAVENOUS_CENTRAL
  Administered 2018-11-20: 2000 [IU]/h via INTRAVENOUS_CENTRAL
  Administered 2018-11-20: 1850 [IU]/h via INTRAVENOUS_CENTRAL
  Administered 2018-11-20: 1650 [IU]/h via INTRAVENOUS_CENTRAL
  Administered 2018-11-20: 1950 [IU]/h via INTRAVENOUS_CENTRAL
  Administered 2018-11-21: 2000 [IU]/h via INTRAVENOUS_CENTRAL
  Administered 2018-11-21: 2050 [IU]/h via INTRAVENOUS_CENTRAL
  Administered 2018-11-21: 2000 [IU]/h via INTRAVENOUS_CENTRAL
  Administered 2018-11-21: 2050 [IU]/h via INTRAVENOUS_CENTRAL
  Administered 2018-11-21: 2000 [IU]/h via INTRAVENOUS_CENTRAL
  Administered 2018-11-21 – 2018-11-22 (×6): 1900 [IU]/h via INTRAVENOUS_CENTRAL
  Administered 2018-11-23 (×3): 1950 [IU]/h via INTRAVENOUS_CENTRAL
  Administered 2018-11-23: 1900 [IU]/h via INTRAVENOUS_CENTRAL
  Administered 2018-11-24 (×3): 1950 [IU]/h via INTRAVENOUS_CENTRAL
  Filled 2018-11-19 (×26): qty 2

## 2018-11-19 NOTE — Progress Notes (Signed)
NAME:  Derek Riley, MRN:  [id number], DOB:  1951-12-02, LOS: 71 ADMISSION DATE:  10/20/2018, CONSULTATION DATE:  11/22 REFERRING MD:  11/22, CHIEF COMPLAINT:  Worsening metabolic acidosis and renal failure w/ clinical decline  Brief History   67 year old male admitted 11/20 w/ gallstone pancreatitis. PCCM consulted 11/22 for progressive metabolic acidosis, acute renal failure and persistent clinical decline.   Past Medical History  Diabetes, gallstones, hypertension.  Significant Hospital Events   11/20 transferred to [facility] from [place] w/ acute gallstone pancreatitis. GI & surgicals services consulted. Started On supportive IVFs, empiric zosyn and analgesic support.  Surgical service is planning on cholecystectomy once pancreatitis improved.  Initial lipase on arrival to [place], creatinine 1.72 11/21: MRCP 11/21 showing extensive pancreatitis, possible hemorrhagic peripancreatic fluid no choledocholithiasis, lipase 3361, creatinine up to 4.91, anion gap 13.  No role for ERCP continued supportive care 11/22: Creatinine up to 6.95, gap now 16, progressive diffuse mottling, worse pain, critical care consulted for shock, and progressive multiorgan failure, moving to intensive care for CRRT as well as hemodynamic support. 3.7 liters + since admit, developed worsening resp failure requiring intubation shortly after arrival. Possible aspiration during intubation  11/26 started on meropenem (d/t rising CBC) CRRT stopped 11/27 extubated, pressors off.  11/28 look weaker. WIll probably need re-intubation. 11/29 reintubated for increasing WOB, likely aspiration even overnight.  11/30: Remains intubated.  White blood cell count now up to 62,000.  CRRT resumed 12/1: No significant change.  Ongoing cuff leak opting for early tracheostomy versus endotracheal tube change.  12/2: tolerating PS wean at 10/5, CT of the abdomen/pelvis ordered 12/6: Initiation of CRRT to remove significant  cumulative fluid balance.  Consults:  11/20 GI and surgical services 11/22 critical care  Procedures:  11/22 Foley catheter 11/22 right IJ CVL (HD)>>> 11/22 OETT 11/22>>>12/1 Trach 12/1 (JY)>>>  Significant Diagnostic Tests:  MRCP 11/21: Acute pancreatitis with extensive pancreatic and periPancreatic inflammatory changes, including proteinaceous and/or hemorrhagic peri-pancreatic fluid.  No choledocholithiasis or biliary tract obstruction.  Small volume of ascites, trace bilateral pleural effusions.  Bilateral basilar volume loss  CT abdomen/pelvis 10/9>> persistent peripancreatic edema and fluid, slightly more organized, small area of apparent pancreatic necrosis, no intra-or extrahepatic biliary ductal dilatation, small bilateral pleural effusions with lower lobe atelectatic change  Micro Data:  Cultures negative  Antimicrobials:  Zosyn 11/20>>>11/26 Meropenem 11/26>>> 11/11/2018 Anidulafungin 12/8 >> 12/11 Meropenem 12/8 >>   Interim history/subjective:  Pressors are weaning Wean from ventilator as tolerated   Objective   Blood pressure (!) 111/53, pulse 70, temperature 99 F (37.2 C), temperature source Oral, resp. rate 14, height 6' (1.829 m), weight 79.6 kg, SpO2 99 %.    Vent Mode: PRVC FiO2 (%):  [35 %-40 %] 35 % Set Rate:  [18 bmp] 18 bmp Vt Set:  [620 mL] 620 mL PEEP:  [5 cmH20] 5 cmH20 Plateau Pressure:  [11 cmH20-19 cmH20] 19 cmH20   Intake/Output Summary (Last 24 hours) at 11/19/2018 1233 Last data filed at 11/19/2018 1100 Gross per 24 hour  Intake 1658.29 ml  Output 2100 ml  Net -441.71 ml   Filed Weights   11/17/18 0500 11/18/18 0300 11/19/18 0500  Weight: 85.2 kg 82.2 kg 79.6 kg    Examination: General: Chronically ill, pale, currently on trach collar HEENT: Some scleral icterus, pupils equal Neuro: Wakes easily to voice, follows commands, globally weak CV: Regular, no murmur PULM: Decreased posterior laterally, no wheezing GI: Sore with  palpation but tolerates,  positive bowel sounds Extremities: 1+ lower extremity edema Skin: No rash     Resolved Hospital Problem list     Assessment & Plan:   Acute gallstone pancreatitis development of pancreatic necrosis no obvious abscess or cystic formation. -No evidence of choledocholelithiasis on MRCP -CT abdomen reassuring 12/9 Plan Remains on second course empiric meropenem day 5.  Anidulafungin stopped on 12/11.  Duration for antimicrobials unclear.  I would favor stopping the meropenem and following him clinically.  Shock.  Suspect distributive, in part related to volume removal with CVVH. Norepinephrine down to 8, continue efforts at weaning. CVVH now running even  Ileus Postpyloric tube in place, tolerating TF May need to reconsider whether a GJ tube should be placed.  Suspect that he will need access for an extended period of time  Ongoing leukocytosis in setting of pancreatic inflammation Plan Improving, follow  Acute hypoxic respiratory failure requiring mechanical ventilation in the setting of acute aspiration event 11/29, progressive atelectasis, and decreased abdominal compliance from pain and impaired cough status post tracheostomy currently requiring ventilatory support. Plan Tolerating trach collar, will try to extend the time on ATC a little bit each day. Can try to use PS as his maintenance ventilator mode Can likely remove trach sutures  Severe deconditioning and protein calorie malnutrition Plan Tube feeds as above  Acute kidney injury with baseline creatinine 1.  This was hemodynamically mediated plus/minus contrast-induced nephropathy Plan CVVH running, not in the position hemodynamically to consider transition to intermittent HD.  Try to change to a even fluid balance  Hyperglycemia, then also periods of hypoglycemia Plan Sliding scale insulin per protocol  Elevated cumulative fluid balance and electrolyte imbalance:  Hyperkalemia Plan Following renal panel, replete electrolytes as indicated  Discussed status, plans with his brother at bedside on 12/12  Independent critical care time 32 minutes  Baltazar Apo, MD, PhD 11/19/2018, 12:48 PM [facility] [phone] or if no answer [phone]

## 2018-11-19 NOTE — Progress Notes (Addendum)
[facility] ROUNDING NOTE   Subjective:   Resting comfortably.  .  Admitted 10/13/2018 with necrotizing pancreatitis.  Being treated with meropenem 1 g every 12 hours and anidulafungin.  100 mg daily.  Dosed by pharmacy.  Continues on CRRT  Blood pressure 98/23 pulse 74 temperature 99.4 O2 sats 100% on 28% FiO2 through trach,  IV pressors                     Norepinephrine 3.4 (decreased)  Removal of 1.3 L 11/19/2018  Sodium 135 potassium 4.1 chloride 101 CO2 24 glucose 180 BUN 30 creatinine 1.56 calcium 7.4 phosphorus 2.2 magnesium 2.6 albumin 1.6 WBC 29.3 hemoglobin 7.6 platelets 197.  Objective:  Vital signs in last 24 hours:  Temp:  [97.6 F (36.4 C)-98.8 F (37.1 C)] 98.8 F (37.1 C) (12/12 0812) Pulse Rate:  [56-77] 56 (12/12 0900) Resp:  [11-25] 11 (12/12 0900) BP: (74-111)/(36-65) 109/46 (12/12 0900) SpO2:  [97 %-100 %] 100 % (12/12 0900) Arterial Line BP: (55-123)/(28-48) 55/46 (12/12 0900) FiO2 (%):  [35 %-40 %] 40 % (12/12 0804) Weight:  [79.6 kg] 79.6 kg (12/12 0500)  Weight change: -2.6 kg Filed Weights   11/17/18 0500 11/18/18 0300 11/19/18 0500  Weight: 85.2 kg 82.2 kg 79.6 kg    Intake/Output: I/O last 3 completed shifts: In: 2507 [I.V.:587.1; Other:32; YY/TK:3546.5; IV Piggyback:493.3] Out: 6812 [Urine:100; Emesis/NG output:1400; XNTZG:0174]   Intake/Output this shift:  Total I/O In: 115 [I.V.:35; NG/GT:80] Out: 101 [Other:101]   Jaundiced appearing awakens to voice CVS- RRR no rubs RS- CTA tracheostomy without drainage ABD-soft distended nonrigid EXT-right femoral non-tunneled tunneled hemodialysis catheter t2 + lower extremity edema anasarca seems improved   Basic Metabolic Panel: Recent Labs  Lab 11/15/18 0324  11/16/18 0354  11/17/18 0516 11/17/18 1624 11/18/18 0304 11/18/18 1620 11/19/18 0329  NA 133*   < > 132*   < > 133* 133* 134* 136 135  K 4.5   < > 5.7*   < > 4.1 3.7 3.0* 3.8 3.8  CL 84*   < > 94*   < > 96* 96*  98 100 101  CO2 30   < > 17*   < > 21* 21* '23 25 23  '$ GLUCOSE 194*   < > 72   < > 128* 155* 152* 164* 174*  BUN 62*   < > 41*   < > 42* 40* 41* 34* 31*  CREATININE 2.84*   < > 2.36*   < > 1.96* 1.97* 1.93* 1.69* 1.59*  CALCIUM 8.8*   < > 7.8*   < > 7.4* 7.4* 7.6* 7.6* 7.5*  MG 1.9  --  2.4  --  2.6*  --  2.7*  --  2.6*  PHOS 4.4   < > 6.1*   < > 5.0* 4.4 3.7 3.0 2.5   < > = values in this interval not displayed.    Liver Function Tests: Recent Labs  Lab 11/13/18 1300  11/16/18 0354  11/17/18 0516 11/17/18 1624 11/18/18 0304 11/18/18 1620 11/19/18 0329  AST 110*  --  168*  --   --   --   --   --   --   ALT 38  --  49*  --   --   --   --   --   --   ALKPHOS 128*  --  169*  --   --   --   --   --   --  BILITOT 5.2*  --  7.0*  --   --   --   --   --   --   PROT 6.1*  --  7.5  --   --   --   --   --   --   ALBUMIN 1.1*   < > 2.2*   < > 1.9* 1.8* 1.8* 1.7* 1.7*   < > = values in this interval not displayed.   Recent Labs  Lab 11/16/18 0354  LIPASE 100*  AMYLASE 118*   No results for input(s): AMMONIA in the last 168 hours.  CBC: Recent Labs  Lab 11/14/18 1222  11/15/18 0816 11/16/18 0354 11/17/18 0516 11/17/18 1200 11/18/18 0304 11/19/18 0329  WBC 54.7*  --  45.7* 75.6* 53.6*  --  46.7* 35.4*  NEUTROABS 41.6*  --  34.4* 60.5*  --   --   --   --   HGB 7.9*   < > 7.4* 7.3* 6.7* 7.8* 8.1* 7.9*  HCT 24.6*   < > 22.7* 23.6* 21.1* 25.1* 24.2* 24.5*  MCV 87.5  --  88.3 91.5 88.7  --  89.0 90.7  PLT 216  --  218 320 335  --  309 292   < > = values in this interval not displayed.    Cardiac Enzymes: Recent Labs  Lab 11/12/18 1235  TROPONINI <0.03    BNP: Invalid input(s): POCBNP  CBG: Recent Labs  Lab 11/18/18 1520 11/18/18 1919 11/18/18 2321 11/19/18 0325 11/19/18 0748  GLUCAP 164* 146* 178* 167* 165*    Microbiology: Results for orders placed or performed during the hospital encounter of 11/06/2018  MRSA PCR Screening     Status: None   Collection  Time: 10/30/18  4:23 PM  Result Value Ref Range Status   MRSA by PCR NEGATIVE NEGATIVE Final    Comment:        The GeneXpert MRSA Assay (FDA approved for NASAL specimens only), is one component of a comprehensive MRSA colonization surveillance program. It is not intended to diagnose MRSA infection nor to guide or monitor treatment for MRSA infections. Performed at [facility], [place] [address] [place], [place] [address]   Culture, blood (Routine X 2) w Reflex to ID Panel     Status: None   Collection Time: 10/31/18 10:22 AM  Result Value Ref Range Status   Specimen Description BLOOD RIGHT ARM  Final   Special Requests   Final    BOTTLES DRAWN AEROBIC ONLY Blood Culture adequate volume   Culture   Final    NO GROWTH 5 DAYS Performed at [place] [facility], [address], [place], [place] [address]    Report Status 11/05/2018 FINAL  Final  Culture, blood (Routine X 2) w Reflex to ID Panel     Status: None   Collection Time: 10/31/18 10:44 AM  Result Value Ref Range Status   Specimen Description BLOOD RIGHT HAND  Final   Special Requests   Final    BOTTLES DRAWN AEROBIC ONLY Blood Culture results may not be optimal due to an inadequate volume of blood received in culture bottles   Culture   Final    NO GROWTH 5 DAYS Performed at [facility], [place] [address] [place], [place] [address]    Report Status 11/05/2018 FINAL  Final  Culture, respiratory (non-expectorated)     Status: None   Collection Time: 10/31/18 11:13 AM  Result Value Ref Range Status  Specimen Description TRACHEAL ASPIRATE  Final   Special Requests NONE  Final   Gram Stain   Final    MODERATE WBC PRESENT,BOTH PMN AND MONONUCLEAR RARE YEAST Performed at [facility], [address] [place], [place] [address]    Culture FEW CANDIDA ALBICANS  Final   Report Status 11/02/2018 FINAL  Final  C difficile quick scan w PCR reflex     Status: None   Collection Time: 11/07/18  9:35 AM  Result  Value Ref Range Status   C Diff antigen NEGATIVE NEGATIVE Final   C Diff toxin NEGATIVE NEGATIVE Final   C Diff interpretation No C. difficile detected.  Final    Comment: Performed at [facility], [place] [address], [place], [place] [address]  Culture, blood (routine x 2)     Status: None   Collection Time: 11/07/18  5:46 PM  Result Value Ref Range Status   Specimen Description BLOOD BLOOD RIGHT HAND  Final   Special Requests   Final    BOTTLES DRAWN AEROBIC ONLY Blood Culture results may not be optimal due to an inadequate volume of blood received in culture bottles   Culture   Final    NO GROWTH 5 DAYS Performed at [facility], [place] [address], [place], [place] [address]    Report Status 11/12/2018 FINAL  Final  Culture, blood (routine x 2)     Status: None   Collection Time: 11/07/18  5:46 PM  Result Value Ref Range Status   Specimen Description BLOOD BLOOD LEFT HAND  Final   Special Requests   Final    BOTTLES DRAWN AEROBIC ONLY Blood Culture adequate volume   Culture   Final    NO GROWTH 5 DAYS Performed at [facility], [place] [address] [place], [place] [address]    Report Status 11/12/2018 FINAL  Final  Culture, blood (routine x 2)     Status: Abnormal (Preliminary result)   Collection Time: 11/15/18  1:12 PM  Result Value Ref Range Status   Specimen Description BLOOD SITE NOT SPECIFIED  Final   Special Requests AEROBIC BOTTLE ONLY Blood Culture adequate volume  Final   Culture  Setup Time   Final    GRAM POSITIVE COCCI AEROBIC BOTTLE ONLY CRITICAL RESULT CALLED TO, READ BACK BY AND VERIFIED WITH: Reece Packer PharmD 17:35 11/16/18 ([email or web address])    Culture (A)  Final    STAPHYLOCOCCUS SPECIES (COAGULASE NEGATIVE) THE SIGNIFICANCE OF ISOLATING THIS ORGANISM FROM A SINGLE SET OF BLOOD CULTURES WHEN MULTIPLE SETS ARE DRAWN IS UNCERTAIN. PLEASE NOTIFY THE MICROBIOLOGY DEPARTMENT WITHIN ONE WEEK IF SPECIATION AND SENSITIVITIES ARE REQUIRED. Performed at [facility], [place] [address], [place], [place] [address]    Report Status PENDING  Incomplete  Fungus culture, blood     Status: None (Preliminary result)   Collection Time: 11/15/18  1:12 PM  Result Value Ref Range Status   Specimen Description BLOOD BLOOD RIGHT HAND  Final   Special Requests AEROBIC BOTTLE ONLY Blood Culture adequate volume  Final   Culture   Final    NO GROWTH 3 DAYS Performed at [facility], [place] [address], [place], [place] [address]    Report Status PENDING  Incomplete  Blood Culture ID Panel (Reflexed)     Status: Abnormal   Collection Time: 11/15/18  1:12 PM  Result Value Ref Range Status   Enterococcus species NOT DETECTED NOT DETECTED Final  Listeria monocytogenes NOT DETECTED NOT DETECTED Final   Staphylococcus species DETECTED (A) NOT DETECTED Final    Comment: Methicillin (oxacillin) resistant coagulase negative staphylococcus. Possible blood culture contaminant (unless isolated from more than one blood culture draw or clinical case suggests pathogenicity). No antibiotic treatment is indicated for blood  culture contaminants. CRITICAL RESULT CALLED TO, READ BACK BY AND VERIFIED WITH: Reece Packer PharmD 17:35 11/16/18 ([email or web address])    Staphylococcus aureus (BCID) NOT DETECTED NOT DETECTED Final   Methicillin resistance DETECTED (A) NOT DETECTED Final    Comment: CRITICAL RESULT CALLED TO, READ BACK BY AND VERIFIED WITH: Reece Packer PharmD 17:35 11/16/18 ([email or web address])    Streptococcus species NOT DETECTED NOT DETECTED Final   Streptococcus agalactiae NOT DETECTED NOT DETECTED Final   Streptococcus pneumoniae NOT DETECTED NOT DETECTED Final   Streptococcus pyogenes NOT DETECTED NOT DETECTED Final   Acinetobacter baumannii NOT DETECTED NOT DETECTED Final   Enterobacteriaceae species NOT DETECTED NOT DETECTED Final   Enterobacter cloacae complex NOT DETECTED NOT DETECTED Final   Escherichia coli NOT DETECTED NOT DETECTED Final   Klebsiella oxytoca NOT DETECTED NOT  DETECTED Final   Klebsiella pneumoniae NOT DETECTED NOT DETECTED Final   Proteus species NOT DETECTED NOT DETECTED Final   Serratia marcescens NOT DETECTED NOT DETECTED Final   Haemophilus influenzae NOT DETECTED NOT DETECTED Final   Neisseria meningitidis NOT DETECTED NOT DETECTED Final   Pseudomonas aeruginosa NOT DETECTED NOT DETECTED Final   Candida albicans NOT DETECTED NOT DETECTED Final   Candida glabrata NOT DETECTED NOT DETECTED Final   Candida krusei NOT DETECTED NOT DETECTED Final   Candida parapsilosis NOT DETECTED NOT DETECTED Final   Candida tropicalis NOT DETECTED NOT DETECTED Final    Comment: Performed at [facility], [place] [place], [place] [address]  Culture, blood (routine x 2)     Status: None (Preliminary result)   Collection Time: 11/15/18  1:13 PM  Result Value Ref Range Status   Specimen Description BLOOD BLOOD RIGHT HAND  Final   Special Requests IN PEDIATRIC BOTTLE Blood Culture adequate volume  Final   Culture   Final    NO GROWTH 3 DAYS Performed at [facility], [place] [address] [place], [place] [address]    Report Status PENDING  Incomplete  Fungus culture, blood     Status: None (Preliminary result)   Collection Time: 11/15/18  1:13 PM  Result Value Ref Range Status   Specimen Description BLOOD BLOOD RIGHT HAND  Final   Special Requests IN PEDIATRIC BOTTLE Blood Culture adequate volume  Final   Culture   Final    NO GROWTH 3 DAYS Performed at [facility], [place] [address] ,  [address]    Report Status PENDING  Incomplete    Coagulation Studies: Recent Labs    11/18/18 0304  LABPROT 21.5*  INR 1.89    Urinalysis: No results for input(s): COLORURINE, LABSPEC, PHURINE, GLUCOSEU, HGBUR, BILIRUBINUR, KETONESUR, PROTEINUR, UROBILINOGEN, NITRITE, LEUKOCYTESUR in the last 72 hours.  Invalid input(s): APPERANCEUR    Imaging: Dg Abd 1 View  Result Date: 11/17/2018 CLINICAL DATA:  Assess feeding tube  placement. EXAM: ABDOMEN - 1 VIEW COMPARISON:  KUB of November 15, 2018 FINDINGS: The feeding tube tip projects at the level of the ligament of Treitz at the origin of the jejunum. There is contrast within the third and fourth portions of the duodenum and in the proximal jejunum. Reportedly a total of 14 cc of  Isovue-300 was used to confirm placement with subsequent flushing of the tube. Reported fluoro time is 5 minutes, 18 seconds. IMPRESSION: The Cortrac feeding tube appears to be in reasonable position at the origin of the jejunum. Electronically Signed   By: David  Martinique M.D.   On: 11/17/2018 16:55   Dg Chest Port 1 View  Result Date: 11/19/2018 CLINICAL DATA:  Respiratory failure EXAM: PORTABLE CHEST 1 VIEW COMPARISON:  11/18/2018 FINDINGS: Cardiac shadow is at the upper limits of normal in size but stable. Tracheostomy tube and feeding catheter are noted. Nasogastric catheter is seen as well. Left jugular central line extends into the distal superior vena cava. No pneumothorax is noted. Mild bibasilar atelectasis is seen. No bony abnormality is noted. IMPRESSION: Tubes and lines as described. Mild bibasilar atelectasis left greater than right. Electronically Signed   By: Inez Catalina M.D.   On: 11/19/2018 07:51   Dg Chest Port 1 View  Result Date: 11/18/2018 CLINICAL DATA:  Acute respiratory failure with hypoxia. EXAM: PORTABLE CHEST 1 VIEW COMPARISON:  Radiograph November 09, 2018. FINDINGS: Stable cardiomediastinal silhouette. Tracheostomy and feeding tubes are unchanged in position. Left internal jugular catheter is unchanged in position. Right internal jugular catheter has been removed. No pneumothorax is noted. Stable bibasilar subsegmental atelectasis or infiltrates are noted. Small left pleural effusion may be present. Bony thorax is unremarkable. IMPRESSION: Bibasilar opacities as described above. Stable support apparatus. Possible small left pleural effusion. Electronically Signed   By:  Marijo Conception, M.D.   On: 11/18/2018 07:15     Medications:   .  prismasol BGK 4/2.5 800 mL/hr at 11/19/18 0432  .  prismasol BGK 4/2.5 300 mL/hr at 11/19/18 0009  . sodium chloride    . sodium chloride 10 mL/hr at 11/19/18 0900  . heparin    . meropenem (MERREM) IV 1 g (11/19/18 0941)  . norepinephrine 8 mcg/min (11/19/18 0900)  . prismasol BGK 4/2.5 2,000 mL/hr at 11/19/18 2585   . chlorhexidine gluconate (MEDLINE KIT)  15 mL Mouth Rinse BID  . Chlorhexidine Gluconate Cloth  6 each Topical Q0600  . feeding supplement (PRO-STAT SUGAR FREE 64)  30 mL Per Tube BID  . feeding supplement (VITAL AF 1.2 CAL)  1,000 mL Per Tube Q24H  . folic acid  1 mg Oral Daily  . HYDROmorphone  1 mg Per Tube Q4H  . insulin aspart  0-9 Units Subcutaneous Q4H  . mouth rinse  15 mL Mouth Rinse 10 times per day  . multivitamin  15 mL Oral Daily  . mupirocin ointment  1 application Nasal BID  . pantoprazole (PROTONIX) IV  40 mg Intravenous Daily  . sodium chloride flush  10-40 mL Intracatheter Q12H  . thiamine injection  100 mg Intravenous Daily   Place/Maintain arterial line **AND** sodium chloride, sodium chloride, acetaminophen, fentaNYL, heparin, heparin, HYDROmorphone (DILAUDID) injection, midazolam, ondansetron (ZOFRAN) IV, sodium chloride flush  Assessment/ Plan:   Acute kidney injury from ischemic ATN CRRT discontinued 11/04/2018 but was restarted 11/07/2018.  11/13/2018 insertion of femoral dialysis catheter.  Filters of clotted we will add back heparin  Septic shock with necrotizing pancreatitis patient discontinued meropenem and Eraxis per critical care with increasing white count.  Blood cultures negative.  CT scan 11/12/2018 with contrast necrosis on more organized fluid collections.   Acute hypoxic respiratory failure status post trach 11/08/2018 continues with hypoxia requiring supplemental oxygen  Hypocalcemia 2.5 calcium bath this corrects for hypoalbuminemia  Anemia transfuse as  needed  Nutrition  status post TPN has GJ tube placed.  Possible pericarditis with TEE revealing trivial pericardial effusion    Temporary hemodialysis catheter removed 12/3 reinserted 11/13/2018  Leukemoid reaction increase white count had received stress dose steroids  Hypokalemia resolved  metabolic acidosis resolved  DNR status.  Continue full measures.    LOS: [place] '@TODAY''@9'$ :51 AM

## 2018-11-19 NOTE — Progress Notes (Signed)
Notified by nursing that CRRT filter is clotting without heparin.  Platelets over 200 K and no active bleeding even though hgb is low.  Will start heparin in system and watch hgb closely   Louis Meckel

## 2018-11-20 LAB — CULTURE, BLOOD (ROUTINE X 2)
Culture: NO GROWTH
Special Requests: ADEQUATE

## 2018-11-20 LAB — CBC
HCT: 24.6 % — ABNORMAL LOW (ref 39.0–52.0)
Hemoglobin: 7.6 g/dL — ABNORMAL LOW (ref 13.0–17.0)
MCH: 28.6 pg (ref 26.0–34.0)
MCHC: 30.9 g/dL (ref 30.0–36.0)
MCV: 92.5 fL (ref 80.0–100.0)
Platelets: 197 10*3/uL (ref 150–400)
RBC: 2.66 MIL/uL — ABNORMAL LOW (ref 4.22–5.81)
RDW: 21.3 % — ABNORMAL HIGH (ref 11.5–15.5)
WBC: 29.3 10*3/uL — ABNORMAL HIGH (ref 4.0–10.5)
nRBC: 0.1 % (ref 0.0–0.2)

## 2018-11-20 LAB — POCT ACTIVATED CLOTTING TIME
ACTIVATED CLOTTING TIME: 158 s
Activated Clotting Time: 136 seconds
Activated Clotting Time: 147 seconds
Activated Clotting Time: 153 seconds
Activated Clotting Time: 158 seconds
Activated Clotting Time: 158 seconds
Activated Clotting Time: 164 seconds
Activated Clotting Time: 164 seconds
Activated Clotting Time: 164 seconds
Activated Clotting Time: 175 seconds
Activated Clotting Time: 175 seconds
Activated Clotting Time: 175 seconds
Activated Clotting Time: 180 seconds
Activated Clotting Time: 186 seconds
Activated Clotting Time: 186 seconds
Activated Clotting Time: 186 seconds
Activated Clotting Time: 191 seconds
Activated Clotting Time: 191 seconds
Activated Clotting Time: 197 seconds
Activated Clotting Time: 197 seconds

## 2018-11-20 LAB — RENAL FUNCTION PANEL
ALBUMIN: 1.6 g/dL — AB (ref 3.5–5.0)
Albumin: 1.6 g/dL — ABNORMAL LOW (ref 3.5–5.0)
Anion gap: 10 (ref 5–15)
Anion gap: 9 (ref 5–15)
BUN: 30 mg/dL — ABNORMAL HIGH (ref 8–23)
BUN: 21 mg/dL (ref 8–23)
CO2: 24 mmol/L (ref 22–32)
CO2: 26 mmol/L (ref 22–32)
CREATININE: 1.2 mg/dL (ref 0.61–1.24)
Calcium: 7.4 mg/dL — ABNORMAL LOW (ref 8.9–10.3)
Calcium: 7.5 mg/dL — ABNORMAL LOW (ref 8.9–10.3)
Chloride: 101 mmol/L (ref 98–111)
Chloride: 101 mmol/L (ref 98–111)
Creatinine, Ser: 1.56 mg/dL — ABNORMAL HIGH (ref 0.61–1.24)
GFR calc Af Amer: 52 mL/min — ABNORMAL LOW (ref >60)
GFR calc Af Amer: >60 mL/min (ref >60)
GFR calc non Af Amer: 45 mL/min — ABNORMAL LOW (ref >60)
GFR calc non Af Amer: >60 mL/min (ref >60)
Glucose, Bld: 180 mg/dL — ABNORMAL HIGH (ref 70–99)
Glucose, Bld: 153 mg/dL — ABNORMAL HIGH (ref 70–99)
PHOSPHORUS: 2.2 mg/dL — AB (ref 2.5–4.6)
Phosphorus: 1.5 mg/dL — ABNORMAL LOW (ref 2.5–4.6)
Potassium: 4.1 mmol/L (ref 3.5–5.1)
Potassium: 4.1 mmol/L (ref 3.5–5.1)
Sodium: 135 mmol/L (ref 135–145)
Sodium: 136 mmol/L (ref 135–145)

## 2018-11-20 LAB — GLUCOSE, CAPILLARY
GLUCOSE-CAPILLARY: 155 mg/dL — AB (ref 70–99)
Glucose-Capillary: 147 mg/dL — ABNORMAL HIGH (ref 70–99)
Glucose-Capillary: 151 mg/dL — ABNORMAL HIGH (ref 70–99)
Glucose-Capillary: 151 mg/dL — ABNORMAL HIGH (ref 70–99)
Glucose-Capillary: 171 mg/dL — ABNORMAL HIGH (ref 70–99)
Glucose-Capillary: 196 mg/dL — ABNORMAL HIGH (ref 70–99)

## 2018-11-20 LAB — APTT: aPTT: 44 seconds — ABNORMAL HIGH (ref 24–36)

## 2018-11-20 LAB — MAGNESIUM: MAGNESIUM: 2.6 mg/dL — AB (ref 1.7–2.4)

## 2018-11-20 MED ORDER — CHLORHEXIDINE GLUCONATE 0.12 % MT SOLN
15.0000 mL | Freq: Two times a day (BID) | OROMUCOSAL | Status: DC
  Administered 2018-11-20 – 2018-11-25 (×11): 15 mL via OROMUCOSAL
  Filled 2018-11-20 (×7): qty 15

## 2018-11-20 MED ORDER — PANTOPRAZOLE SODIUM 40 MG PO PACK
40.0000 mg | PACK | Freq: Every day | ORAL | Status: DC
  Administered 2018-11-21 – 2018-11-25 (×5): 40 mg
  Filled 2018-11-20 (×6): qty 20

## 2018-11-20 MED ORDER — NOREPINEPHRINE 16 MG/250ML-% IV SOLN
0.0000 ug/min | INTRAVENOUS | Status: DC
  Administered 2018-11-20: 5 ug/min via INTRAVENOUS
  Administered 2018-11-22: 4.053 ug/min via INTRAVENOUS
  Administered 2018-11-23 – 2018-11-24 (×2): 10 ug/min via INTRAVENOUS
  Administered 2018-11-25: 12 ug/min via INTRAVENOUS
  Filled 2018-11-20 (×4): qty 250

## 2018-11-20 MED ORDER — NOREPINEPHRINE 4 MG/250ML-% IV SOLN: 0.0000 ug/min | INTRAVENOUS | Status: DC

## 2018-11-20 MED ORDER — HYDROMORPHONE HCL 2 MG PO TABS
1.0000 mg | ORAL_TABLET | Freq: Two times a day (BID) | ORAL | Status: DC
  Administered 2018-11-20 – 2018-11-25 (×10): 1 mg
  Filled 2018-11-20 (×10): qty 1

## 2018-11-20 MED ORDER — ORAL CARE MOUTH RINSE
15.0000 mL | Freq: Two times a day (BID) | OROMUCOSAL | Status: DC
  Administered 2018-11-21 – 2018-11-25 (×10): 15 mL via OROMUCOSAL

## 2018-11-20 NOTE — Progress Notes (Addendum)
Physical Therapy Treatment Patient Details Name: Derek Riley MRN: [id number] DOB: [birth year] Today's Date: 11/20/2018    History of Present Illness 67 y.o. male admitted 11/20 with abdominal pain, found to have severe gallstone pancreatitis. Intubated 11/21-11/27, re-intubated 11/30 with trach 12/1 remains intubated and on CRRT at time of PT evaluation 11/18/18. PMH includes: HTN, HLD, DM    PT Comments    Pt admitted with above diagnosis. Pt currently with functional limitations due to balance, strength and endurance deficits. Pt was able to assist with UE and left LE exercises in bed.  Pt with decr left grip and left shoulder limited to 100 degrees of flexion with incr tone initiallly.  Right shoulder at 120 degrees.   Left LE more stiff as well but did loosen up with activity.  Did not exercise right LE due to HD cath.  Will continue to progress pt as able. He works hard with activities that are asked of him.   Pt will benefit from skilled PT to increase their independence and safety with mobility to allow discharge to the venue listed below.     Follow Up Recommendations  Supervision/Assistance - 24 hour;SNF     Equipment Recommendations  (TBD)    Recommendations for Other Services       Precautions / Restrictions Precautions Precautions: Fall Precaution Comments: A Line, Femoral CRRT, trach Restrictions Weight Bearing Restrictions: No    Mobility  Bed Mobility               General bed mobility comments: deferred due to femoral CRRT access, hypotension  Transfers                    Ambulation/Gait                 Stairs             Wheelchair Mobility    Modified Rankin (Stroke Patients Only)       Balance Overall balance assessment: (N/T this visit)                                          Cognition Arousal/Alertness: Awake/alert Behavior During Therapy: WFL for tasks assessed/performed Overall Cognitive  Status: Difficult to assess                                        Exercises General Exercises - Upper Extremity Shoulder Horizontal ADduction: AAROM;Both;10 reps;Supine Digit Composite Flexion: AAROM;Both;10 reps;Supine Composite Extension: AAROM;Both;10 reps;Supine Low Level/ICU Exercises Ankle Circles/Pumps: 20 reps;AROM;Both;Supine Quad Sets: 10 reps;AROM;Both;Supine Hip ABduction/ADduction: AAROM;10 reps;Supine;Left Heel Slides: 10 reps;AAROM;Left;Supine Shoulder Flexion: 10 reps;AAROM;Both;Supine Elbow Flexion: 10 reps;AAROM;AROM;Supine;Both    General Comments General comments (skin integrity, edema, etc.): Pt on 28% trach collar with VSS with exercises.        Pertinent Vitals/Pain Pain Assessment: Faces Faces Pain Scale: Hurts little more Pain Location: generalized  Pain Descriptors / Indicators: Discomfort;Grimacing Pain Intervention(s): Limited activity within patient's tolerance;Monitored during session;Premedicated before session;Repositioned    Home Living                      Prior Function            PT Goals (current goals can now be found in the care plan section)  Acute Rehab PT Goals Patient Stated Goal: none stated Progress towards PT goals: Progressing toward goals(limited by right femoral HD cath)    Frequency    Min 2X/week      PT Plan Current plan remains appropriate    Co-evaluation              AM-PAC PT "6 Clicks" Mobility   Outcome Measure  Help needed turning from your back to your side while in a flat bed without using bedrails?: Total Help needed moving from lying on your back to sitting on the side of a flat bed without using bedrails?: Total Help needed moving to and from a bed to a chair (including a wheelchair)?: Total Help needed standing up from a chair using your arms (e.g., wheelchair or bedside chair)?: Total Help needed to walk in hospital room?: Total Help needed climbing 3-5 steps  with a railing? : Total 6 Click Score: 6    End of Session Equipment Utilized During Treatment: (Vent) Activity Tolerance: Patient limited by fatigue Patient left: in bed;with call bell/phone within reach Nurse Communication: Mobility status PT Visit Diagnosis: Unsteadiness on feet (R26.81)     Time: 5732-2025 PT Time Calculation (min) (ACUTE ONLY): 15 min  Charges:  $Therapeutic Exercise: 8-22 mins                     Deanza Upperman,PT Acute Rehabilitation Services Pager:  [phone]  Office:  [place] 11/20/2018, 1:38 PM

## 2018-11-20 NOTE — Progress Notes (Signed)
NAME:  DERICK SEMINARA, MRN:  [id number], DOB:  Feb 22, 1951, LOS: 23 ADMISSION DATE:  10/10/2018, CONSULTATION DATE:  11/22 REFERRING MD:  11/22, CHIEF COMPLAINT:  Worsening metabolic acidosis and renal failure w/ clinical decline  Brief History   67 year old male admitted 11/20 w/ gallstone pancreatitis. PCCM consulted 11/22 for progressive metabolic acidosis, acute renal failure and persistent clinical decline.   Past Medical History  Diabetes, gallstones, hypertension.  Significant Hospital Events   11/20 transferred to [facility] from [place] w/ acute gallstone pancreatitis. GI & surgicals services consulted. Started On supportive IVFs, empiric zosyn and analgesic support.  Surgical service is planning on cholecystectomy once pancreatitis improved.  Initial lipase on arrival to [place], creatinine 1.72 11/21: MRCP 11/21 showing extensive pancreatitis, possible hemorrhagic peripancreatic fluid no choledocholithiasis, lipase 3361, creatinine up to 4.91, anion gap 13.  No role for ERCP continued supportive care 11/22: Creatinine up to 6.95, gap now 16, progressive diffuse mottling, worse pain, critical care consulted for shock, and progressive multiorgan failure, moving to intensive care for CRRT as well as hemodynamic support. 3.7 liters + since admit, developed worsening resp failure requiring intubation shortly after arrival. Possible aspiration during intubation  11/26 started on meropenem (d/t rising CBC) CRRT stopped 11/27 extubated, pressors off.  11/28 look weaker. WIll probably need re-intubation. 11/29 reintubated for increasing WOB, likely aspiration even overnight.  11/30: Remains intubated.  White blood cell count now up to 62,000.  CRRT resumed 12/1: No significant change.  Ongoing cuff leak opting for early tracheostomy versus endotracheal tube change.  12/2: tolerating PS wean at 10/5, CT of the abdomen/pelvis ordered 12/6: Initiation of CRRT to remove significant  cumulative fluid balance.  Consults:  11/20 GI and surgical services 11/22 critical care  Procedures:  11/22 Foley catheter 11/22 right IJ CVL (HD)>>> 11/22 OETT 11/22>>>12/1 Trach 12/1 (JY)>>>  Significant Diagnostic Tests:  MRCP 11/21: Acute pancreatitis with extensive pancreatic and periPancreatic inflammatory changes, including proteinaceous and/or hemorrhagic peri-pancreatic fluid.  No choledocholithiasis or biliary tract obstruction.  Small volume of ascites, trace bilateral pleural effusions.  Bilateral basilar volume loss  CT abdomen/pelvis 10/9>> persistent peripancreatic edema and fluid, slightly more organized, small area of apparent pancreatic necrosis, no intra-or extrahepatic biliary ductal dilatation, small bilateral pleural effusions with lower lobe atelectatic change  Micro Data:  Cultures negative  Antimicrobials:  Zosyn 11/20>>>11/26 Meropenem 11/26>>> 11/11/2018 Anidulafungin 12/8 >> 12/11 Meropenem 12/8 >>   Interim history/subjective:  Tolerating some ATC Pressors weaned to off CVVHD filter has been clotting  Objective   Blood pressure (!) 96/46, pulse 77, temperature 99.4 F (37.4 C), temperature source Axillary, resp. rate (!) 24, height 6' (1.829 m), weight 78.7 kg, SpO2 98 %. CVP:  [2 mmHg] 2 mmHg  Vent Mode: PRVC FiO2 (%):  [28 %-40 %] 28 % Set Rate:  [18 bmp] 18 bmp Vt Set:  [620 mL] 620 mL PEEP:  [5 cmH20] 5 cmH20 Plateau Pressure:  [18 cmH20-22 cmH20] 20 cmH20   Intake/Output Summary (Last 24 hours) at 11/20/2018 1210 Last data filed at 11/20/2018 1100 Gross per 24 hour  Intake 1839.37 ml  Output 1605 ml  Net 234.37 ml   Filed Weights   11/18/18 0300 11/19/18 0500 11/20/18 0400  Weight: 82.2 kg 79.6 kg 78.7 kg    Examination: General: Chronically ill, pale, currently on trach collar HEENT: Some scleral icterus, pupils equal Neuro: Wakes easily to voice, follows commands, globally weak CV: Regular, no murmur PULM: Decreased  posterior laterally,  no wheezing GI: Sore with palpation but tolerates, positive bowel sounds Extremities: 1+ lower extremity edema Skin: No rash     Resolved Hospital Problem list     Assessment & Plan:   Acute gallstone pancreatitis development of pancreatic necrosis no obvious abscess or cystic formation. -No evidence of choledocholelithiasis on MRCP -CT abdomen reassuring 12/9 Plan Following off meropenem Now tolerating postpyloric tube feeding  Shock.  Suspect distributive, in part related to volume removal with CVVH. Norepinephrine weaned to off on 12/12 CVVHD now running I/O even  Ileus Tolerating postpyloric tube feeding Still may need to consider whether GJ tube should be placed given the likelihood that he will require long term nutritional supplementation  Ongoing leukocytosis in setting of pancreatic inflammation Plan Improving  Acute hypoxic respiratory failure requiring mechanical ventilation in the setting of acute aspiration event 11/29, progressive atelectasis, and decreased abdominal compliance from pain and impaired cough status post tracheostomy currently requiring ventilatory support. Plan Now tolerating some trach collar.  We will try to extend the time on trach collar a bit each day Use PS as his maintenance support mode Okay to remove trach sutures  Severe deconditioning and protein calorie malnutrition Plan Tube feeding as above  Acute kidney injury with baseline creatinine 1.  This was hemodynamically mediated plus/minus contrast-induced nephropathy Plan Unclear as to whether he will have a renal recovery. Continue CVVHD for now.  Suspect that we will be able to change to intermittent HD soon  Hyperglycemia, then also periods of hypoglycemia Plan Sliding scale insulin per protocol  Elevated cumulative fluid balance and electrolyte imbalance: Hyperkalemia Plan Following renal panel Replete electrolytes as indicated  Discussed status,  plans with the patient's brother and other family members at bedside on 12/13.  In particular need to continue to sort out the patient's goals for care, whether he would want LTAC care etc.  This needs to be done in light of the fact that he will likely be dialysis dependent.  Independent critical care time 34 minutes  Baltazar Apo, MD, PhD 11/20/2018, 12:10 PM [facility] [phone] or if no answer [phone]

## 2018-11-20 NOTE — Evaluation (Signed)
[facility] Speaking Valve - Evaluation Patient Details  Name: Derek Riley MRN: [id number] Date of Birth: [birth year]  Today's Date: 11/20/2018 Time: 7867-5449 SLP Time Calculation (min) (ACUTE ONLY): 20 min  Past Medical History:  Past Medical History:  Diagnosis Date  . Diabetes mellitus without complication ([place])   . Gallstone 10/2018  . Hyperlipidemia   . Hypertension    Past Surgical History:  Past Surgical History:  Procedure Laterality Date  . CIRCUMCISION    . excision of moles     face & leg  . TONSILLECTOMY     HPI:  67 y.o. male admitted 11/20 with abdominal pain, found to have severe gallstone pancreatitis. Intubated 11/21-11/27, re-intubated 11/30 with trach 12/1 remains intubated and on CRRT at time of PT evaluation 11/18/18. PMH includes: HTN, HLD, DM   Assessment / Plan / Recommendation Clinical Impression  Pt's VS remained stable across cuff deflation and PMV placement for 10 minutes. He did not initiate conversation, but was appropriately responsive to questioning. His vocal quality was adequate and clear, although given mostly one-word responses, it was difficult to discern how effective his breath support would be for conversational level speech. Pt was lethargic and when given the option to continue with PMV trial or rest, he preferred to rest. Recommend continuing use of PMV with full staff supervision for short periods of time. Swallow evaluation was deferred at this time as NGT is still set to suction.  SLP Visit Diagnosis: Aphonia (R49.1)    SLP Assessment  Patient needs continued Speech Lanaguage Pathology Services    Follow Up Recommendations  Skilled Nursing facility    Frequency and Duration min 2x/week  2 weeks    PMSV Trial PMSV was placed for: 10 min Able to redirect subglottic air through upper airway: Yes Able to Attain Phonation: Yes Voice Quality: Normal Able to Expectorate Secretions: No attempts Level of Secretion Expectoration  with PMSV: Not observed Breath Support for Phonation: Mildly decreased Intelligibility: Intelligible Respirations During Trial: 29 SpO2 During Trial: 96 % Pulse During Trial: 74 Behavior: Controlled;Cooperative;Responsive to questions   Tracheostomy Tube       Vent Dependency  FiO2 (%): 28 %    Cuff Deflation Trial  GO Tolerated Cuff Deflation: Yes Length of Time for Cuff Deflation Trial: 12 min Behavior: Controlled;Cooperative        Germain Osgood 11/20/2018, 4:39 PM   Germain Osgood, M.A. [place] Acute Environmental education officer [phone] Office [phone]

## 2018-11-21 LAB — RENAL FUNCTION PANEL
Albumin: 1.5 g/dL — ABNORMAL LOW (ref 3.5–5.0)
Albumin: 1.4 g/dL — ABNORMAL LOW (ref 3.5–5.0)
Anion gap: 9 (ref 5–15)
Anion gap: 8 (ref 5–15)
BUN: 28 mg/dL — ABNORMAL HIGH (ref 8–23)
BUN: 29 mg/dL — ABNORMAL HIGH (ref 8–23)
CALCIUM: 7.4 mg/dL — AB (ref 8.9–10.3)
CO2: 25 mmol/L (ref 22–32)
CO2: 26 mmol/L (ref 22–32)
Calcium: 7.4 mg/dL — ABNORMAL LOW (ref 8.9–10.3)
Chloride: 102 mmol/L (ref 98–111)
Chloride: 102 mmol/L (ref 98–111)
Creatinine, Ser: 1.45 mg/dL — ABNORMAL HIGH (ref 0.61–1.24)
Creatinine, Ser: 1.48 mg/dL — ABNORMAL HIGH (ref 0.61–1.24)
GFR calc Af Amer: 57 mL/min — ABNORMAL LOW (ref >60)
GFR calc Af Amer: 56 mL/min — ABNORMAL LOW (ref >60)
GFR calc non Af Amer: 49 mL/min — ABNORMAL LOW (ref >60)
GFR calc non Af Amer: 48 mL/min — ABNORMAL LOW (ref >60)
Glucose, Bld: 213 mg/dL — ABNORMAL HIGH (ref 70–99)
Glucose, Bld: 197 mg/dL — ABNORMAL HIGH (ref 70–99)
Phosphorus: 1.9 mg/dL — ABNORMAL LOW (ref 2.5–4.6)
Phosphorus: 2 mg/dL — ABNORMAL LOW (ref 2.5–4.6)
Potassium: 4.3 mmol/L (ref 3.5–5.1)
Potassium: 4.4 mmol/L (ref 3.5–5.1)
Sodium: 136 mmol/L (ref 135–145)
Sodium: 136 mmol/L (ref 135–145)

## 2018-11-21 LAB — CBC
HCT: 23.6 % — ABNORMAL LOW (ref 39.0–52.0)
Hemoglobin: 7.6 g/dL — ABNORMAL LOW (ref 13.0–17.0)
MCH: 29.6 pg (ref 26.0–34.0)
MCHC: 32.2 g/dL (ref 30.0–36.0)
MCV: 91.8 fL (ref 80.0–100.0)
Platelets: 159 10*3/uL (ref 150–400)
RBC: 2.57 MIL/uL — ABNORMAL LOW (ref 4.22–5.81)
RDW: 21.8 % — AB (ref 11.5–15.5)
WBC: 32.4 10*3/uL — ABNORMAL HIGH (ref 4.0–10.5)
nRBC: 0 % (ref 0.0–0.2)

## 2018-11-21 LAB — MAGNESIUM: Magnesium: 3.2 mg/dL — ABNORMAL HIGH (ref 1.7–2.4)

## 2018-11-21 LAB — POCT ACTIVATED CLOTTING TIME
Activated Clotting Time: 197 seconds
Activated Clotting Time: 219 seconds

## 2018-11-21 LAB — GLUCOSE, CAPILLARY
Glucose-Capillary: 174 mg/dL — ABNORMAL HIGH (ref 70–99)
Glucose-Capillary: 204 mg/dL — ABNORMAL HIGH (ref 70–99)
Glucose-Capillary: 208 mg/dL — ABNORMAL HIGH (ref 70–99)
Glucose-Capillary: 162 mg/dL — ABNORMAL HIGH (ref 70–99)
Glucose-Capillary: 162 mg/dL — ABNORMAL HIGH (ref 70–99)

## 2018-11-21 LAB — APTT: aPTT: 129 seconds — ABNORMAL HIGH (ref 24–36)

## 2018-11-21 MED ORDER — MIDODRINE HCL 2.5 MG PO TABS
10.0000 mg | ORAL_TABLET | Freq: Three times a day (TID) | ORAL | Status: DC
  Administered 2018-11-21 – 2018-11-25 (×15): 10 mg via ORAL
  Filled 2018-11-21: qty 2
  Filled 2018-11-21: qty 4
  Filled 2018-11-21 (×3): qty 2
  Filled 2018-11-21: qty 4
  Filled 2018-11-21: qty 2
  Filled 2018-11-21 (×3): qty 4
  Filled 2018-11-21 (×2): qty 2
  Filled 2018-11-21 (×3): qty 4
  Filled 2018-11-21: qty 2
  Filled 2018-11-21: qty 4
  Filled 2018-11-21: qty 2

## 2018-11-21 MED ORDER — SENNOSIDES 8.8 MG/5ML PO SYRP
10.0000 mL | ORAL_SOLUTION | Freq: Every evening | ORAL | Status: DC | PRN
  Administered 2018-11-23 – 2018-11-25 (×3): 10 mL
  Filled 2018-11-21 (×4): qty 10

## 2018-11-21 MED ORDER — DOCUSATE SODIUM 50 MG/5ML PO LIQD
50.0000 mg | Freq: Two times a day (BID) | ORAL | Status: DC | PRN
  Administered 2018-11-21: 50 mg
  Filled 2018-11-21: qty 10

## 2018-11-21 NOTE — Plan of Care (Signed)
Pt understood prognosis after meeting with family and MD today During meeting [facility] Valve uesed

## 2018-11-21 NOTE — Plan of Care (Signed)
So far pt handling trach collar w/o need of vent

## 2018-11-21 NOTE — Progress Notes (Signed)
NAME:  Derek Riley, MRN:  [id number], DOB:  1951/08/16, LOS: 24 ADMISSION DATE:  10/26/2018, CONSULTATION DATE:  11/22 REFERRING MD:  11/22, CHIEF COMPLAINT:  Worsening metabolic acidosis and renal failure w/ clinical decline  Brief History   67 year old male admitted 11/20 w/ gallstone pancreatitis. PCCM consulted 11/22 for progressive metabolic acidosis, acute renal failure and persistent clinical decline.   Past Medical History  Diabetes, gallstones, hypertension.  Significant Hospital Events   11/20 transferred to [facility] from [place] w/ acute gallstone pancreatitis. GI & surgicals services consulted. Started On supportive IVFs, empiric zosyn and analgesic support.  Surgical service is planning on cholecystectomy once pancreatitis improved.  Initial lipase on arrival to [place], creatinine 1.72 11/21: MRCP 11/21 showing extensive pancreatitis, possible hemorrhagic peripancreatic fluid no choledocholithiasis, lipase 3361, creatinine up to 4.91, anion gap 13.  No role for ERCP continued supportive care 11/22: Creatinine up to 6.95, gap now 16, progressive diffuse mottling, worse pain, critical care consulted for shock, and progressive multiorgan failure, moving to intensive care for CRRT as well as hemodynamic support. 3.7 liters + since admit, developed worsening resp failure requiring intubation shortly after arrival. Possible aspiration during intubation  11/26 started on meropenem (d/t rising CBC) CRRT stopped 11/27 extubated, pressors off.  11/28 look weaker. WIll probably need re-intubation. 11/29 reintubated for increasing WOB, likely aspiration even overnight.  11/30: Remains intubated.  White blood cell count now up to 62,000.  CRRT resumed 12/1: No significant change.  Ongoing cuff leak opting for early tracheostomy versus endotracheal tube change.  12/2: tolerating PS wean at 10/5, CT of the abdomen/pelvis ordered 12/6: Initiation of CRRT to remove significant  cumulative fluid balance.  Consults:  11/20 GI and surgical services 11/22 critical care  Procedures:  11/22 right IJ CVL (HD)>>> 11/22 OETT 11/22>>>12/1 Trach 12/1 (JY)>>>  Significant Diagnostic Tests:  MRCP 11/21: Acute pancreatitis with extensive pancreatic and periPancreatic inflammatory changes, including proteinaceous and/or hemorrhagic peri-pancreatic fluid.  No choledocholithiasis or biliary tract obstruction.  Small volume of ascites, trace bilateral pleural effusions.  Bilateral basilar volume loss  CT abdomen/pelvis 10/9>> persistent peripancreatic edema and fluid, slightly more organized, small area of apparent pancreatic necrosis, no intra-or extrahepatic biliary ductal dilatation, small bilateral pleural effusions with lower lobe atelectatic change  Micro Data:  Cultures negative  Antimicrobials:  Zosyn 11/20>>>11/26 Meropenem 11/26>>> 11/11/2018 Anidulafungin 12/8 >> 12/11 Meropenem 12/8 >> 12/12  Interim history/subjective:  He tolerated trach collar all night last night, continues on this and has a comfortable respiratory pattern Low-dose norepinephrine restarted, currently on 2 CVVH still running He still has significant gastric tube secretions and drainage, appears to be tolerating the postpyloric feeding   Objective   Blood pressure (!) 70/39, pulse 81, temperature (!) 100.5 F (38.1 C), temperature source Axillary, resp. rate (!) 29, height 6' (1.829 m), weight 78.6 kg, SpO2 99 %.    FiO2 (%):  [28 %] 28 %   Intake/Output Summary (Last 24 hours) at 11/21/2018 1026 Last data filed at 11/21/2018 0900 Gross per 24 hour  Intake 1836.94 ml  Output 1796 ml  Net 40.94 ml   Filed Weights   11/19/18 0500 11/20/18 0400 11/21/18 0200  Weight: 79.6 kg 78.7 kg 78.6 kg    Examination: General: Chronically ill, pale, somewhat jaundiced, comfortable HEENT: Some scleral icterus, pupils equal Neuro: Wakes easily to voice, follows commands, answers questions,  globally weak CV: Regular, no murmur PULM: Decreased posterior laterally, no wheezing GI: Diffuse mild tenderness  with palpation but he tolerates, positive bowel sounds Extremities: Trace to 1+ lower extremity edema Skin: Pale, somewhat jaundiced, no rash     Resolved Hospital Problem list     Assessment & Plan:   Acute gallstone pancreatitis development of pancreatic necrosis no obvious abscess or cystic formation. -No evidence of choledocholelithiasis on MRCP -CT abdomen reassuring 12/9 Plan Following off meropenem and anidulafungin Now tolerating postpyloric tube feeding.  He still has gastric suctioning and secretions.  Still unclear that he is ready for oral intake.   Shock.  Suspect distributive, in part related to volume removal with CVVH. Continue to wean norepinephrine as able volume shifts, Sirs physiology contributing CVVH now running I/O even Midodrin added 12/14  Ileus Tolerating postpyloric tube feeding I think it be reasonable to try to challenge him with p.o. intake when we believe he can adequately swallow.  He is working with [facility] and is now on trach collar Still may need to consider whether GJ tube placement would make the most sense to allow long-term nutritional supplementation.  Some of this will depend on goals of care discussions  Ongoing leukocytosis in setting of pancreatic inflammation Plan Stable but remains elevated  Acute hypoxic respiratory failure requiring mechanical ventilation in the setting of acute aspiration event 11/29, progressive atelectasis, and decreased abdominal compliance from pain and impaired cough status post tracheostomy currently requiring ventilatory support. Plan Continue trach collar ad lib. Continue to work with PMV for speaking valve and possibly also swallowing  Severe deconditioning and protein calorie malnutrition Plan Tube feeding as above  Acute kidney injury with baseline creatinine 1.  This was hemodynamically  mediated plus/minus contrast-induced nephropathy Plan Likely transition CVVHD to intermittent HD once his blood pressure will tolerate.  Appreciate nephrology assistance, note that Midodrin was added on 12/14  Hyperglycemia, then also periods of hypoglycemia Plan Sliding-scale insulin per protocol  Elevated cumulative fluid balance and electrolyte imbalance: Hyperkalemia Plan Continue to follow renal panel Replete electrolytes as indicated His fluid balance is now even for the admission  I discussed his status, prognosis with his brother and other family members earlier in the week.  They like to meet today and talk in front of the patient himself so that he can get some understanding of his prognosis, presumed outcome that will include dependent care for extended period of time, probably lifelong hemodialysis.  They will have this discussion before we commit to [facility], SNF etc.  Independent critical care time 35 minutes  Baltazar Apo, MD, PhD 11/21/2018, 10:26 AM [facility] [phone] or if no answer [phone]

## 2018-11-21 NOTE — Progress Notes (Signed)
[name] ROUNDING NOTE   Subjective:   Resting comfortably.  .  Admitted 10/25/2018 with necrotizing pancreatitis.  Continues on CRRT  Blood pressure 92/65  pulse 82  temperature afebrile  O2 sats 100% on 28% FiO2 through trach,  IV pressors                     Norepinephrine 2.1 (decreased)  Removal of 200 cc  12/13   Anuric   Na 136   K 4.3  CO2 25  Cr 1.45     Objective:  Vital signs in last 24 hours:  Temp:  [99 F (37.2 C)-99.4 F (37.4 C)] 99 F (37.2 C) (12/13 1200) Pulse Rate:  [68-86] 85 (12/14 0630) Resp:  [18-30] 29 (12/14 0630) BP: (52-162)/(32-137) 92/65 (12/14 0630) SpO2:  [95 %-100 %] 95 % (12/14 0630) Arterial Line BP: (75-120)/(23-45) 114/43 (12/14 0630) FiO2 (%):  [28 %-40 %] 28 % (12/14 0400) Weight:  [78.6 kg] 78.6 kg (12/14 0200)  Weight change: -0.1 kg Filed Weights   11/19/18 0500 11/20/18 0400 11/21/18 0200  Weight: 79.6 kg 78.7 kg 78.6 kg    Intake/Output: I/O last 3 completed shifts: In: 2743.9 [I.V.:583.9; Other:220; NG/GT:1840; IV Piggyback:100] Out: 2526 [Emesis/NG output:1500; Other:1026]   Intake/Output this shift:  Total I/O In: 1072.1 [I.V.:187.1; NG/GT:885] Out: 1020 [Emesis/NG output:600; Other:420]   Jaundiced appearing awakens to voice CVS- RRR no rubs RS- CTA tracheostomy without drainage ABD-soft distended nonrigid EXT-right femoral non-tunneled tunneled hemodialysis catheter t2 + lower extremity edema anasarca seems improved   Basic Metabolic Panel: Recent Labs  Lab 11/16/18 0354  11/17/18 0516  11/18/18 0304  11/19/18 0329 11/19/18 1625 11/20/18 0314 11/20/18 1604 11/21/18 0332  NA 132*   < > 133*   < > 134*   < > 135 134* 135 136 136  K 5.7*   < > 4.1   < > 3.0*   < > 3.8 4.0 4.1 4.1 4.3  CL 94*   < > 96*   < > 98   < > 101 101 101 101 102  CO2 17*   < > 21*   < > 23   < > 23 23 24 26 25   GLUCOSE 72   < > 128*   < > 152*   < > 174* 230* 180* 153* 213*  BUN 41*   < > 42*   < > 41*   < >  31* 33* 30* 21 28*  CREATININE 2.36*   < > 1.96*   < > 1.93*   < > 1.59* 1.73* 1.56* 1.20 1.45*  CALCIUM 7.8*   < > 7.4*   < > 7.6*   < > 7.5* 7.4* 7.4* 7.5* 7.4*  MG 2.4  --  2.6*  --  2.7*  --  2.6*  --  2.6*  --   --   PHOS 6.1*   < > 5.0*   < > 3.7   < > 2.5 3.1 2.2* 1.5* 1.9*   < > = values in this interval not displayed.    Liver Function Tests: Recent Labs  Lab 11/16/18 0354  11/19/18 0329 11/19/18 1625 11/20/18 0314 11/20/18 1604 11/21/18 0332  AST 168*  --   --   --   --   --   --   ALT 49*  --   --   --   --   --   --   ALKPHOS 169*  --   --   --   --   --   --  BILITOT 7.0*  --   --   --   --   --   --   PROT 7.5  --   --   --   --   --   --   ALBUMIN 2.2*   < > 1.7* 1.6* 1.6* 1.6* 1.5*   < > = values in this interval not displayed.   Recent Labs  Lab 11/16/18 0354  LIPASE 100*  AMYLASE 118*   No results for input(s): AMMONIA in the last 168 hours.  CBC: Recent Labs  Lab 11/14/18 1222  11/15/18 0816 11/16/18 0354 11/17/18 0516 11/17/18 1200 11/18/18 0304 11/19/18 0329 11/20/18 0314 11/21/18 0332  WBC 54.7*  --  45.7* 75.6* 53.6*  --  46.7* 35.4* 29.3* 32.4*  NEUTROABS 41.6*  --  34.4* 60.5*  --   --   --   --   --   --   HGB 7.9*   < > 7.4* 7.3* 6.7* 7.8* 8.1* 7.9* 7.6* 7.6*  HCT 24.6*   < > 22.7* 23.6* 21.1* 25.1* 24.2* 24.5* 24.6* 23.6*  MCV 87.5  --  88.3 91.5 88.7  --  89.0 90.7 92.5 91.8  PLT 216  --  218 320 335  --  309 292 197 159   < > = values in this interval not displayed.    Cardiac Enzymes: No results for input(s): CKTOTAL, CKMB, CKMBINDEX, TROPONINI in the last 168 hours.  BNP: Invalid input(s): POCBNP  CBG: Recent Labs  Lab 11/20/18 1125 11/20/18 1602 11/20/18 1934 11/20/18 2347 11/21/18 0404  GLUCAP 155* 147* 151* 53* 63*    Microbiology: Results for orders placed or performed during the hospital encounter of 10/31/2018  MRSA PCR Screening     Status: None   Collection Time: 10/30/18  4:23 PM  Result Value Ref  Range Status   MRSA by PCR NEGATIVE NEGATIVE Final    Comment:        The GeneXpert MRSA Assay (FDA approved for NASAL specimens only), is one component of a comprehensive MRSA colonization surveillance program. It is not intended to diagnose MRSA infection nor to guide or monitor treatment for MRSA infections. Performed at [facility], [place] [address] [place], [place] [address]   Culture, blood (Routine X 2) w Reflex to ID Panel     Status: None   Collection Time: 10/31/18 10:22 AM  Result Value Ref Range Status   Specimen Description BLOOD RIGHT ARM  Final   Special Requests   Final    BOTTLES DRAWN AEROBIC ONLY Blood Culture adequate volume   Culture   Final    NO GROWTH 5 DAYS Performed at [facility], [address] [place], [place] [address]    Report Status 11/05/2018 FINAL  Final  Culture, blood (Routine X 2) w Reflex to ID Panel     Status: None   Collection Time: 10/31/18 10:44 AM  Result Value Ref Range Status   Specimen Description BLOOD RIGHT HAND  Final   Special Requests   Final    BOTTLES DRAWN AEROBIC ONLY Blood Culture results may not be optimal due to an inadequate volume of blood received in culture bottles   Culture   Final    NO GROWTH 5 DAYS Performed at [facility], [place] [address] [place], [place] [address]    Report Status 11/05/2018 FINAL  Final  Culture, respiratory (non-expectorated)     Status: None   Collection Time: 10/31/18  11:13 AM  Result Value Ref Range Status   Specimen Description TRACHEAL ASPIRATE  Final   Special Requests NONE  Final   Gram Stain   Final    MODERATE WBC PRESENT,BOTH PMN AND MONONUCLEAR RARE YEAST Performed at [facility], [address] [place], [place] [address]    Culture FEW CANDIDA ALBICANS  Final   Report Status 11/02/2018 FINAL  Final  C difficile quick scan w PCR reflex     Status: None   Collection Time: 11/07/18  9:35 AM  Result Value Ref Range Status   C Diff antigen  NEGATIVE NEGATIVE Final   C Diff toxin NEGATIVE NEGATIVE Final   C Diff interpretation No C. difficile detected.  Final    Comment: Performed at [facility], [place] [address] [place], [place] [address]  Culture, blood (routine x 2)     Status: None   Collection Time: 11/07/18  5:46 PM  Result Value Ref Range Status   Specimen Description BLOOD BLOOD RIGHT HAND  Final   Special Requests   Final    BOTTLES DRAWN AEROBIC ONLY Blood Culture results may not be optimal due to an inadequate volume of blood received in culture bottles   Culture   Final    NO GROWTH 5 DAYS Performed at [facility], [place] [address] [place], [place] [address]    Report Status 11/12/2018 FINAL  Final  Culture, blood (routine x 2)     Status: None   Collection Time: 11/07/18  5:46 PM  Result Value Ref Range Status   Specimen Description BLOOD BLOOD LEFT HAND  Final   Special Requests   Final    BOTTLES DRAWN AEROBIC ONLY Blood Culture adequate volume   Culture   Final    NO GROWTH 5 DAYS Performed at [facility], [place] [address] [place], [place] [address]    Report Status 11/12/2018 FINAL  Final  Culture, blood (routine x 2)     Status: Abnormal (Preliminary result)   Collection Time: 11/15/18  1:12 PM  Result Value Ref Range Status   Specimen Description BLOOD SITE NOT SPECIFIED  Final   Special Requests AEROBIC BOTTLE ONLY Blood Culture adequate volume  Final   Culture  Setup Time   Final    GRAM POSITIVE COCCI AEROBIC BOTTLE ONLY CRITICAL RESULT CALLED TO, READ BACK BY AND VERIFIED WITH: Reece Packer PharmD 17:35 11/16/18 ([email or web address])    Culture (A)  Final    STAPHYLOCOCCUS SPECIES (COAGULASE NEGATIVE) THE SIGNIFICANCE OF ISOLATING THIS ORGANISM FROM A SINGLE SET OF BLOOD CULTURES WHEN MULTIPLE SETS ARE DRAWN IS UNCERTAIN. PLEASE NOTIFY THE MICROBIOLOGY DEPARTMENT WITHIN ONE WEEK IF SPECIATION AND SENSITIVITIES ARE REQUIRED. Performed at [facility], [place] [address], [place],  [place] [address]    Report Status PENDING  Incomplete  Fungus culture, blood     Status: None (Preliminary result)   Collection Time: 11/15/18  1:12 PM  Result Value Ref Range Status   Specimen Description BLOOD BLOOD RIGHT HAND  Final   Special Requests AEROBIC BOTTLE ONLY Blood Culture adequate volume  Final   Culture   Final    NO GROWTH 5 DAYS Performed at [facility], [place] [address] [place],  [address]    Report Status PENDING  Incomplete  Blood Culture ID Panel (Reflexed)     Status: Abnormal   Collection Time: 11/15/18  1:12 PM  Result Value Ref Range Status  Enterococcus species NOT DETECTED NOT DETECTED Final   Listeria monocytogenes NOT DETECTED NOT DETECTED Final   Staphylococcus species DETECTED (A) NOT DETECTED Final    Comment: Methicillin (oxacillin) resistant coagulase negative staphylococcus. Possible blood culture contaminant (unless isolated from more than one blood culture draw or clinical case suggests pathogenicity). No antibiotic treatment is indicated for blood  culture contaminants. CRITICAL RESULT CALLED TO, READ BACK BY AND VERIFIED WITH: Reece Packer PharmD 17:35 11/16/18 ([email or web address])    Staphylococcus aureus (BCID) NOT DETECTED NOT DETECTED Final   Methicillin resistance DETECTED (A) NOT DETECTED Final    Comment: CRITICAL RESULT CALLED TO, READ BACK BY AND VERIFIED WITH: Reece Packer PharmD 17:35 11/16/18 ([email or web address])    Streptococcus species NOT DETECTED NOT DETECTED Final   Streptococcus agalactiae NOT DETECTED NOT DETECTED Final   Streptococcus pneumoniae NOT DETECTED NOT DETECTED Final   Streptococcus pyogenes NOT DETECTED NOT DETECTED Final   Acinetobacter baumannii NOT DETECTED NOT DETECTED Final   Enterobacteriaceae species NOT DETECTED NOT DETECTED Final   Enterobacter cloacae complex NOT DETECTED NOT DETECTED Final   Escherichia coli NOT DETECTED NOT DETECTED Final   Klebsiella oxytoca NOT DETECTED NOT DETECTED Final   Klebsiella pneumoniae NOT  DETECTED NOT DETECTED Final   Proteus species NOT DETECTED NOT DETECTED Final   Serratia marcescens NOT DETECTED NOT DETECTED Final   Haemophilus influenzae NOT DETECTED NOT DETECTED Final   Neisseria meningitidis NOT DETECTED NOT DETECTED Final   Pseudomonas aeruginosa NOT DETECTED NOT DETECTED Final   Candida albicans NOT DETECTED NOT DETECTED Final   Candida glabrata NOT DETECTED NOT DETECTED Final   Candida krusei NOT DETECTED NOT DETECTED Final   Candida parapsilosis NOT DETECTED NOT DETECTED Final   Candida tropicalis NOT DETECTED NOT DETECTED Final    Comment: Performed at [facility], [place]. [address] [place], [place] [address]  Culture, blood (routine x 2)     Status: None   Collection Time: 11/15/18  1:13 PM  Result Value Ref Range Status   Specimen Description BLOOD BLOOD RIGHT HAND  Final   Special Requests IN PEDIATRIC BOTTLE Blood Culture adequate volume  Final   Culture   Final    NO GROWTH 5 DAYS Performed at [facility], [place] [address], [place], [place] [address]    Report Status 11/20/2018 FINAL  Final  Fungus culture, blood     Status: None (Preliminary result)   Collection Time: 11/15/18  1:13 PM  Result Value Ref Range Status   Specimen Description BLOOD BLOOD RIGHT HAND  Final   Special Requests IN PEDIATRIC BOTTLE Blood Culture adequate volume  Final   Culture   Final    NO GROWTH 5 DAYS Performed at [facility], [place] [address], [place], [place] [address]    Report Status PENDING  Incomplete    Coagulation Studies: No results for input(s): LABPROT, INR in the last 72 hours.  Urinalysis: No results for input(s): COLORURINE, LABSPEC, PHURINE, GLUCOSEU, HGBUR, BILIRUBINUR, KETONESUR, PROTEINUR, UROBILINOGEN, NITRITE, LEUKOCYTESUR in the last 72 hours.  Invalid input(s): APPERANCEUR    Imaging: No results found.   Medications:   .  prismasol BGK 4/2.5 800 mL/hr at 11/21/18 0249  .  prismasol BGK 4/2.5 300 mL/hr at 11/21/18 0509   . sodium chloride    . sodium chloride 10 mL/hr at 11/21/18 0600  . heparin 10,000 units/ 20 mL infusion syringe 2,000 Units/hr (11/21/18 0509)  . heparin 999 mL/hr at 11/20/18 0645  .  norepinephrine (LEVOPHED) Adult infusion 2 mcg/min (11/21/18 0600)  . prismasol BGK 4/2.5 2,000 mL/hr at 11/21/18 0605   . chlorhexidine  15 mL Mouth Rinse BID  . Chlorhexidine Gluconate Cloth  6 each Topical Q0600  . feeding supplement (PRO-STAT SUGAR FREE 64)  30 mL Per Tube BID  . feeding supplement (VITAL AF 1.2 CAL)  1,000 mL Per Tube Q24H  . folic acid  1 mg Oral Daily  . HYDROmorphone  1 mg Per Tube Q12H  . insulin aspart  0-9 Units Subcutaneous Q4H  . mouth rinse  15 mL Mouth Rinse q12n4p  . multivitamin  15 mL Oral Daily  . mupirocin ointment  1 application Nasal BID  . pantoprazole sodium  40 mg Per Tube Daily  . sodium chloride flush  10-40 mL Intracatheter Q12H  . thiamine injection  100 mg Intravenous Daily   Place/Maintain arterial line **AND** sodium chloride, sodium chloride, acetaminophen, heparin, heparin, heparin, HYDROmorphone (DILAUDID) injection, ondansetron (ZOFRAN) IV, sodium chloride flush  Assessment/ Plan:   Acute kidney injury from ischemic ATN CRRT discontinued 11/04/2018 but was restarted 11/07/2018.  11/13/2018 insertion of femoral dialysis catheter.  Filters of clotted we will add back heparin  Septic shock with necrotizing pancreatitis patient discontinued meropenem and Eraxis per critical care with increasing white count.  Blood cultures negative.  CT scan 11/12/2018 with contrast necrosis on more organized fluid collections.   Acute hypoxic respiratory failure status post trach 11/08/2018 decreased Fi02  Hypocalcemia 2.5 calcium bath this corrects for hypoalbuminemia  Anemia transfuse as needed  Nutrition status post TPN has GJ tube placed.  Possible pericarditis with TEE revealing trivial pericardial effusion    Temporary hemodialysis catheter removed 12/3  reinserted 11/13/2018  Leukemoid reaction increase white count had received stress dose steroids  Hypokalemia resolved  metabolic acidosis resolved  DNR status.  Continue full measures. Appears to be stable on dialysis with decreased oxygen and decreased pressor requirements  Will continue CRRT  Will add midodrine 10 mg TID to see if we can wean pressors     LOS: [place] @TODAY @6 :47 AM

## 2018-11-22 ENCOUNTER — Inpatient Hospital Stay (HOSPITAL_COMMUNITY): Payer: Medicare Other

## 2018-11-22 LAB — RENAL FUNCTION PANEL
Albumin: 1.5 g/dL — ABNORMAL LOW (ref 3.5–5.0)
Albumin: 1.5 g/dL — ABNORMAL LOW (ref 3.5–5.0)
Anion gap: 12 (ref 5–15)
Anion gap: 10 (ref 5–15)
BUN: 26 mg/dL — ABNORMAL HIGH (ref 8–23)
BUN: 26 mg/dL — ABNORMAL HIGH (ref 8–23)
CO2: 23 mmol/L (ref 22–32)
CO2: 22 mmol/L (ref 22–32)
Calcium: 7.2 mg/dL — ABNORMAL LOW (ref 8.9–10.3)
Calcium: 7.1 mg/dL — ABNORMAL LOW (ref 8.9–10.3)
Chloride: 102 mmol/L (ref 98–111)
Chloride: 102 mmol/L (ref 98–111)
Creatinine, Ser: 1.44 mg/dL — ABNORMAL HIGH (ref 0.61–1.24)
Creatinine, Ser: 1.43 mg/dL — ABNORMAL HIGH (ref 0.61–1.24)
GFR calc Af Amer: 58 mL/min — ABNORMAL LOW (ref >60)
GFR calc Af Amer: 58 mL/min — ABNORMAL LOW (ref >60)
GFR calc non Af Amer: 50 mL/min — ABNORMAL LOW (ref >60)
GFR, EST NON AFRICAN AMERICAN: 50 mL/min — AB (ref >60)
Glucose, Bld: 145 mg/dL — ABNORMAL HIGH (ref 70–99)
Glucose, Bld: 184 mg/dL — ABNORMAL HIGH (ref 70–99)
Phosphorus: 1.9 mg/dL — ABNORMAL LOW (ref 2.5–4.6)
Phosphorus: 2.7 mg/dL (ref 2.5–4.6)
Potassium: 4.4 mmol/L (ref 3.5–5.1)
Potassium: 4.3 mmol/L (ref 3.5–5.1)
Sodium: 137 mmol/L (ref 135–145)
Sodium: 134 mmol/L — ABNORMAL LOW (ref 135–145)

## 2018-11-22 LAB — MAGNESIUM: Magnesium: 2.4 mg/dL (ref 1.7–2.4)

## 2018-11-22 LAB — GLUCOSE, CAPILLARY
Glucose-Capillary: 102 mg/dL — ABNORMAL HIGH (ref 70–99)
Glucose-Capillary: 110 mg/dL — ABNORMAL HIGH (ref 70–99)
Glucose-Capillary: 146 mg/dL — ABNORMAL HIGH (ref 70–99)
Glucose-Capillary: 147 mg/dL — ABNORMAL HIGH (ref 70–99)

## 2018-11-22 LAB — CBC
HCT: 22.2 % — ABNORMAL LOW (ref 39.0–52.0)
Hemoglobin: 7 g/dL — ABNORMAL LOW (ref 13.0–17.0)
MCH: 28.9 pg (ref 26.0–34.0)
MCHC: 31.5 g/dL (ref 30.0–36.0)
MCV: 91.7 fL (ref 80.0–100.0)
PLATELETS: 144 10*3/uL — AB (ref 150–400)
RBC: 2.42 MIL/uL — ABNORMAL LOW (ref 4.22–5.81)
RDW: 22.5 % — ABNORMAL HIGH (ref 11.5–15.5)
WBC: 34.4 10*3/uL — ABNORMAL HIGH (ref 4.0–10.5)
nRBC: 0 % (ref 0.0–0.2)

## 2018-11-22 LAB — POCT ACTIVATED CLOTTING TIME
ACTIVATED CLOTTING TIME: 197 s
ACTIVATED CLOTTING TIME: 213 s
ACTIVATED CLOTTING TIME: 219 s
Activated Clotting Time: 180 seconds
Activated Clotting Time: 186 seconds
Activated Clotting Time: 224 seconds
Activated Clotting Time: 224 seconds
Activated Clotting Time: 208 seconds
Activated Clotting Time: 208 seconds
Activated Clotting Time: 197 seconds

## 2018-11-22 LAB — POCT I-STAT 3, ART BLOOD GAS (G3+)
Bicarbonate: 22.2 mmol/L (ref 20.0–28.0)
O2 Saturation: 96 %
TCO2: 23 mmol/L (ref 22–32)
pCO2 arterial: 25.2 mmHg — ABNORMAL LOW (ref 32.0–48.0)
pH, Arterial: 7.552 — ABNORMAL HIGH (ref 7.350–7.450)
pO2, Arterial: 65 mmHg — ABNORMAL LOW (ref 83.0–108.0)

## 2018-11-22 LAB — APTT: aPTT: 120 seconds — ABNORMAL HIGH (ref 24–36)

## 2018-11-22 MED ORDER — SODIUM CHLORIDE 0.9 % IV BOLUS
1000.0000 mL | Freq: Once | INTRAVENOUS | Status: AC
  Administered 2018-11-22: 1000 mL via INTRAVENOUS

## 2018-11-22 MED ORDER — SODIUM PHOSPHATES 45 MMOLE/15ML IV SOLN
10.0000 mmol | Freq: Once | INTRAVENOUS | Status: AC
  Administered 2018-11-22: 10 mmol via INTRAVENOUS
  Filled 2018-11-22: qty 3.33

## 2018-11-22 NOTE — Progress Notes (Signed)
[facility] Progress Note Patient Name: Derek Riley DOB: 1951-11-01 MRN: [id number]   Date of Service  11/22/2018  HPI/Events of Note  Hypotension - BP = 55/40 with MAP = 47. LVEF = 60-65%.  eICU Interventions  Will order: 1. Bolus with 0.9 NaCl 1 liter IV over 30 minutes now.  2. Monitor CVP now and Q 4 hours.      Intervention Category Major Interventions: Hypotension - evaluation and management  Sommer,Steven Eugene 11/22/2018, 1:21 AM

## 2018-11-22 NOTE — Progress Notes (Signed)
NAME:  Derek Riley, MRN:  [id number], DOB:  March 29, 1951, LOS: 25 ADMISSION DATE:  10/27/2018, CONSULTATION DATE:  11/22 REFERRING MD:  11/22, CHIEF COMPLAINT:  Worsening metabolic acidosis and renal failure w/ clinical decline  Brief History   67 year old male admitted 11/20 w/ gallstone pancreatitis. PCCM consulted 11/22 for progressive metabolic acidosis, acute renal failure and persistent clinical decline.   Past Medical History  Diabetes, gallstones, hypertension.  Significant Hospital Events   11/20 transferred to [facility] from [place] w/ acute gallstone pancreatitis. GI & surgicals services consulted. Started On supportive IVFs, empiric zosyn and analgesic support.  Surgical service is planning on cholecystectomy once pancreatitis improved.  Initial lipase on arrival to [facility], creatinine 1.72 11/21: MRCP 11/21 showing extensive pancreatitis, possible hemorrhagic peripancreatic fluid no choledocholithiasis, lipase 3361, creatinine up to 4.91, anion gap 13.  No role for ERCP continued supportive care 11/22: Creatinine up to 6.95, gap now 16, progressive diffuse mottling, worse pain, critical care consulted for shock, and progressive multiorgan failure, moving to intensive care for CRRT as well as hemodynamic support. 3.7 liters + since admit, developed worsening resp failure requiring intubation shortly after arrival. Possible aspiration during intubation  11/26 started on meropenem (d/t rising CBC) CRRT stopped 11/27 extubated, pressors off.  11/28 look weaker. WIll probably need re-intubation. 11/29 reintubated for increasing WOB, likely aspiration even overnight.  11/30: Remains intubated.  White blood cell count now up to 62,000.  CRRT resumed 12/1: No significant change.  Ongoing cuff leak opting for early tracheostomy versus endotracheal tube change.  12/2: tolerating PS wean at 10/5, CT of the abdomen/pelvis ordered 12/6: Initiation of CRRT to remove significant  cumulative fluid balance.  Consults:  11/20 GI and surgical services 11/22 critical care  Procedures:  11/22 right IJ CVL (HD)>>> 11/22 OETT 11/22>>>12/1 Trach 12/1 (JY)>>>  Significant Diagnostic Tests:  MRCP 11/21: Acute pancreatitis with extensive pancreatic and periPancreatic inflammatory changes, including proteinaceous and/or hemorrhagic peri-pancreatic fluid.  No choledocholithiasis or biliary tract obstruction.  Small volume of ascites, trace bilateral pleural effusions.  Bilateral basilar volume loss  CT abdomen/pelvis 10/9>> persistent peripancreatic edema and fluid, slightly more organized, small area of apparent pancreatic necrosis, no intra-or extrahepatic biliary ductal dilatation, small bilateral pleural effusions with lower lobe atelectatic change  Micro Data:  Cultures negative  Antimicrobials:  Zosyn 11/20>>>11/26 Meropenem 11/26>>> 11/11/2018 Anidulafungin 12/8 >> 12/11 Meropenem 12/8 >> 12/12  Interim history/subjective:  Pressors were restarted overnight. His tube feeding was temporarily held He was able to do some Passy-Muir valve and speaking yesterday. Positive bowel movement   Objective   Blood pressure (!) 100/59, pulse 75, temperature 99.3 F (37.4 C), temperature source Axillary, resp. rate (!) 26, height 6' (1.829 m), weight 78.6 kg, SpO2 95 %. CVP:  [2 mmHg-11 mmHg] 6 mmHg  FiO2 (%):  [28 %] 28 %   Intake/Output Summary (Last 24 hours) at 11/22/2018 1222 Last data filed at 11/22/2018 1200 Gross per 24 hour  Intake 3239.55 ml  Output 1794 ml  Net 1445.55 ml   Filed Weights   11/20/18 0400 11/21/18 0200 11/22/18 0500  Weight: 78.7 kg 78.6 kg 78.6 kg    Examination: General: Pale, chronically ill, somewhat jaundiced, comfortable HEENT: Scleral icterus, pupils equal, oropharynx clear, NG tube in place Neuro: Wakes easily to voice, follows commands, answers questions, globally weak CV: Regular, no murmur PULM: Decreased bilateral basilar  breath sounds, no wheezing GI: Mild diffuse tenderness with palpation, positive bowel sounds Extremities: Trace  to 1+ lower extremity edema Skin: No rashes     Resolved Hospital Problem list     Assessment & Plan:   Acute gallstone pancreatitis development of pancreatic necrosis no obvious abscess or cystic formation. -No evidence of choledocholelithiasis on MRCP -CT abdomen reassuring 12/9 Plan Currently following off of antimicrobials (he has had 2 discrete courses) He had been tolerating postpyloric tube feeding but he had emesis overnight and the rate was decreased.  He still has gastric suctioning and secretions  Shock.  Suspect distributive, in part related to volume removal with CVVH. Wean norepinephrine as we are able.  Volume shifts including those with CVVH are playing a role here. Running CVVHD even Midodrin added 12/14  Ileus Tolerating postpyloric tube feeding I think it be reasonable to try to challenge him with p.o. intake when we believe he can adequately swallow.  He is working with [facility] and is now on trach collar Still may need to consider whether GJ tube placement would make the most sense to allow long-term nutritional supplementation.  Some of this will depend on goals of care discussions  Ongoing leukocytosis in setting of pancreatic inflammation Plan Elevated but stable  Acute hypoxic respiratory failure requiring mechanical ventilation in the setting of acute aspiration event 11/29, progressive atelectasis, and decreased abdominal compliance from pain and impaired cough status post tracheostomy currently requiring ventilatory support. Plan Continue trach collar ad lib.  Can likely remove ventilator from room Continue work with PMV, speaking, hopefully will be able to consider swallowing sometime soon  Severe deconditioning and protein calorie malnutrition Plan Tube feeding rate cut in half on 12/15, try to advance over the next 24 hours He may still  ultimately require a GJ tube depending on his wishes for his care.  They are contemplating whether he would truly want long-term care, LTAC care, SNF care  Acute kidney injury with baseline creatinine 1.  This was hemodynamically mediated plus/minus contrast-induced nephropathy Plan Likely transition CVVHD to intermittent HD once his blood pressure will tolerate.  Appreciate nephrology assistance, note that Midodrin was added on 12/14  Hyperglycemia, then also periods of hypoglycemia Plan Sliding scale insulin per protocol  Elevated cumulative fluid balance and electrolyte imbalance: Hyperkalemia Plan Continue to follow renal panel Replete electrolytes as indicated His fluid balance is now even for the admission   I met with the patient's family and the patient together 11/14 in his room to talk about the aspects of his critical illness, the organ systems that are impaired, his overall prognosis and his likely disposition to SNF, LTAC, etc.  Explained the fact that he would likely be dialysis dependent.  Explained that hopefully he will be ventilator free which may make it possible for him to one day be decannulated.  Explained to him the difficulty with his enteral intake going forward given his pancreatic dysfunction.  They are going to discuss further, try to incorporate his goals into the future plans.   Baltazar Apo, MD, PhD 11/22/2018, 12:22 PM [facility] [phone] or if no answer [phone]

## 2018-11-22 NOTE — Progress Notes (Signed)
[facility] Progress Note Patient Name: KYROS SALZWEDEL DOB: [birth year] MRN: [id number]   Date of Service  11/22/2018  HPI/Events of Note  Hypotension - BP = 70/48 with MAP = 57. CVP = 6-7.  eICU Interventions  Will order: 1. Bolus with 0.9 NaCl 1 liter IV over 1 hour now.      Intervention Category Major Interventions: Hypotension - evaluation and management  Sommer,Steven Eugene 11/22/2018, 2:42 AM

## 2018-11-22 NOTE — Progress Notes (Signed)
[facility] ROUNDING NOTE   Subjective:   Resting comfortably.  .  Admitted 11/04/2018 with necrotizing pancreatitis.  Continues on CRRT recrudescence of fever and hypotension.  Blood pressure 97/38 pulse 75 temperature 99.3 T-max 100.4 trach collar 28% FiO2 O2 sats 96%  IV pressors                     Norepinephrine 11.1 (increased )  Removal of 1.4 L 11/21/2018   anuric   Sodium 137 potassium 4.4 chloride 102 CO2 23 BUN 26 creatinine 1.44 glucose 145 calcium 7.2 phosphorus 1.9 Albumin 1.5 WBC 34.4 hemoglobin 7.0 platelets 144    Objective:  Vital signs in last 24 hours:  Temp:  [98.2 F (36.8 C)-100.4 F (38 C)] 99.3 F (37.4 C) (12/15 1132) Pulse Rate:  [75-92] 75 (12/15 1200) Resp:  [20-40] 26 (12/15 1200) BP: (73-118)/(30-84) 100/59 (12/15 1200) SpO2:  [95 %-99 %] 95 % (12/15 1200) Arterial Line BP: (84-130)/(34-45) 96/37 (12/15 1200) FiO2 (%):  [28 %] 28 % (12/15 1200) Weight:  [78.6 kg] 78.6 kg (12/15 0500)  Weight change: 0 kg Filed Weights   11/20/18 0400 11/21/18 0200 11/22/18 0500  Weight: 78.7 kg 78.6 kg 78.6 kg    Intake/Output: I/O last 3 completed shifts: In: 4518.4 [I.V.:1975.8; Other:230; NG/GT:2312.6] Out: 1093 [Emesis/NG output:1425; Other:1331]   Intake/Output this shift:  Total I/O In: 370.6 [I.V.:105.6; NG/GT:265] Out: 542 [Emesis/NG output:450; Other:92]   Jaundiced appearing awakens to voice CVS- RRR no rubs RS- CTA tracheostomy without drainage ABD-soft distended nonrigid EXT-right femoral non-tunneled tunneled hemodialysis catheter t2 + lower extremity edema anasarca seems improved   Basic Metabolic Panel: Recent Labs  Lab 11/18/18 0304  11/19/18 0329  11/20/18 0314 11/20/18 1604 11/21/18 0332 11/21/18 1639 11/22/18 0440 11/22/18 0611  NA 134*   < > 135   < > 135 136 136 136  --  137  K 3.0*   < > 3.8   < > 4.1 4.1 4.3 4.4  --  4.4  CL 98   < > 101   < > 101 101 102 102  --  102  CO2 23   < > 23   < > 24 26  25 26   --  23  GLUCOSE 152*   < > 174*   < > 180* 153* 213* 197*  --  145*  BUN 41*   < > 31*   < > 30* 21 28* 29*  --  26*  CREATININE 1.93*   < > 1.59*   < > 1.56* 1.20 1.45* 1.48*  --  1.44*  CALCIUM 7.6*   < > 7.5*   < > 7.4* 7.5* 7.4* 7.4*  --  7.2*  MG 2.7*  --  2.6*  --  2.6*  --  3.2*  --  2.4  --   PHOS 3.7   < > 2.5   < > 2.2* 1.5* 1.9* 2.0*  --  1.9*   < > = values in this interval not displayed.    Liver Function Tests: Recent Labs  Lab 11/16/18 0354  11/20/18 0314 11/20/18 1604 11/21/18 0332 11/21/18 1639 11/22/18 0611  AST 168*  --   --   --   --   --   --   ALT 49*  --   --   --   --   --   --   ALKPHOS 169*  --   --   --   --   --   --  BILITOT 7.0*  --   --   --   --   --   --   PROT 7.5  --   --   --   --   --   --   ALBUMIN 2.2*   < > 1.6* 1.6* 1.5* 1.4* 1.5*   < > = values in this interval not displayed.   Recent Labs  Lab 11/16/18 0354  LIPASE 100*  AMYLASE 118*   No results for input(s): AMMONIA in the last 168 hours.  CBC: Recent Labs  Lab 11/16/18 0354  11/18/18 0304 11/19/18 0329 11/20/18 0314 11/21/18 0332 11/22/18 0440  WBC 75.6*   < > 46.7* 35.4* 29.3* 32.4* 34.4*  NEUTROABS 60.5*  --   --   --   --   --   --   HGB 7.3*   < > 8.1* 7.9* 7.6* 7.6* 7.0*  HCT 23.6*   < > 24.2* 24.5* 24.6* 23.6* 22.2*  MCV 91.5   < > 89.0 90.7 92.5 91.8 91.7  PLT 320   < > 309 292 197 159 144*   < > = values in this interval not displayed.    Cardiac Enzymes: No results for input(s): CKTOTAL, CKMB, CKMBINDEX, TROPONINI in the last 168 hours.  BNP: Invalid input(s): POCBNP  CBG: Recent Labs  Lab 11/21/18 1948 11/22/18 0021 11/22/18 0410 11/22/18 0744 11/22/18 1131  GLUCAP 162* 146* 102* 147* 110*    Microbiology: Results for orders placed or performed during the hospital encounter of 11/07/2018  MRSA PCR Screening     Status: None   Collection Time: 10/30/18  4:23 PM  Result Value Ref Range Status   MRSA by PCR NEGATIVE NEGATIVE Final     Comment:        The GeneXpert MRSA Assay (FDA approved for NASAL specimens only), is one component of a comprehensive MRSA colonization surveillance program. It is not intended to diagnose MRSA infection nor to guide or monitor treatment for MRSA infections. Performed at [facility], [place] [address] [place], [place] [address]   Culture, blood (Routine X 2) w Reflex to ID Panel     Status: None   Collection Time: 10/31/18 10:22 AM  Result Value Ref Range Status   Specimen Description BLOOD RIGHT ARM  Final   Special Requests   Final    BOTTLES DRAWN AEROBIC ONLY Blood Culture adequate volume   Culture   Final    NO GROWTH 5 DAYS Performed at [facility], [address] [place], [place] [address]    Report Status 11/05/2018 FINAL  Final  Culture, blood (Routine X 2) w Reflex to ID Panel     Status: None   Collection Time: 10/31/18 10:44 AM  Result Value Ref Range Status   Specimen Description BLOOD RIGHT HAND  Final   Special Requests   Final    BOTTLES DRAWN AEROBIC ONLY Blood Culture results may not be optimal due to an inadequate volume of blood received in culture bottles   Culture   Final    NO GROWTH 5 DAYS Performed at [facility], [place] [address], [place], [place] [address]    Report Status 11/05/2018 FINAL  Final  Culture, respiratory (non-expectorated)     Status: None   Collection Time: 10/31/18 11:13 AM  Result Value Ref Range Status   Specimen Description TRACHEAL ASPIRATE  Final   Special Requests NONE  Final   Gram Stain  Final    MODERATE WBC PRESENT,BOTH PMN AND MONONUCLEAR RARE YEAST Performed at [facility], [place] [address], [place], [place] [address]    Culture FEW CANDIDA ALBICANS  Final   Report Status 11/02/2018 FINAL  Final  C difficile quick scan w PCR reflex     Status: None   Collection Time: 11/07/18  9:35 AM  Result Value Ref Range Status   C Diff antigen NEGATIVE NEGATIVE Final   C Diff toxin NEGATIVE NEGATIVE  Final   C Diff interpretation No C. difficile detected.  Final    Comment: Performed at [facility], [place] [address] [place], [place] [address]  Culture, blood (routine x 2)     Status: None   Collection Time: 11/07/18  5:46 PM  Result Value Ref Range Status   Specimen Description BLOOD BLOOD RIGHT HAND  Final   Special Requests   Final    BOTTLES DRAWN AEROBIC ONLY Blood Culture results may not be optimal due to an inadequate volume of blood received in culture bottles   Culture   Final    NO GROWTH 5 DAYS Performed at [facility], [place] [address], [place], [place] [address]    Report Status 11/12/2018 FINAL  Final  Culture, blood (routine x 2)     Status: None   Collection Time: 11/07/18  5:46 PM  Result Value Ref Range Status   Specimen Description BLOOD BLOOD LEFT HAND  Final   Special Requests   Final    BOTTLES DRAWN AEROBIC ONLY Blood Culture adequate volume   Culture   Final    NO GROWTH 5 DAYS Performed at [facility], [place] [address], [place], [place] [address]    Report Status 11/12/2018 FINAL  Final  Culture, blood (routine x 2)     Status: Abnormal (Preliminary result)   Collection Time: 11/15/18  1:12 PM  Result Value Ref Range Status   Specimen Description BLOOD SITE NOT SPECIFIED  Final   Special Requests AEROBIC BOTTLE ONLY Blood Culture adequate volume  Final   Culture  Setup Time   Final    GRAM POSITIVE COCCI AEROBIC BOTTLE ONLY CRITICAL RESULT CALLED TO, READ BACK BY AND VERIFIED WITH: Reece Packer PharmD 17:35 11/16/18 ([email or web address])    Culture (A)  Final    STAPHYLOCOCCUS SPECIES (COAGULASE NEGATIVE) THE SIGNIFICANCE OF ISOLATING THIS ORGANISM FROM A SINGLE SET OF BLOOD CULTURES WHEN MULTIPLE SETS ARE DRAWN IS UNCERTAIN. PLEASE NOTIFY THE MICROBIOLOGY DEPARTMENT WITHIN ONE WEEK IF SPECIATION AND SENSITIVITIES ARE REQUIRED. Performed at [facility], [place] [address], [place], [place] [address]    Report Status PENDING  Incomplete  Fungus  culture, blood     Status: None (Preliminary result)   Collection Time: 11/15/18  1:12 PM  Result Value Ref Range Status   Specimen Description BLOOD BLOOD RIGHT HAND  Final   Special Requests AEROBIC BOTTLE ONLY Blood Culture adequate volume  Final   Culture   Final    NO GROWTH 6 DAYS Performed at [facility], [place] [address] [place],  [address]    Report Status PENDING  Incomplete  Blood Culture ID Panel (Reflexed)     Status: Abnormal   Collection Time: 11/15/18  1:12 PM  Result Value Ref Range Status   Enterococcus species NOT DETECTED NOT DETECTED Final   Listeria monocytogenes NOT DETECTED NOT DETECTED Final   Staphylococcus species DETECTED (A) NOT DETECTED Final  Comment: Methicillin (oxacillin) resistant coagulase negative staphylococcus. Possible blood culture contaminant (unless isolated from more than one blood culture draw or clinical case suggests pathogenicity). No antibiotic treatment is indicated for blood  culture contaminants. CRITICAL RESULT CALLED TO, READ BACK BY AND VERIFIED WITH: Reece Packer PharmD 17:35 11/16/18 ([email or web address])    Staphylococcus aureus (BCID) NOT DETECTED NOT DETECTED Final   Methicillin resistance DETECTED (A) NOT DETECTED Final    Comment: CRITICAL RESULT CALLED TO, READ BACK BY AND VERIFIED WITH: Reece Packer PharmD 17:35 11/16/18 ([email or web address])    Streptococcus species NOT DETECTED NOT DETECTED Final   Streptococcus agalactiae NOT DETECTED NOT DETECTED Final   Streptococcus pneumoniae NOT DETECTED NOT DETECTED Final   Streptococcus pyogenes NOT DETECTED NOT DETECTED Final   Acinetobacter baumannii NOT DETECTED NOT DETECTED Final   Enterobacteriaceae species NOT DETECTED NOT DETECTED Final   Enterobacter cloacae complex NOT DETECTED NOT DETECTED Final   Escherichia coli NOT DETECTED NOT DETECTED Final   Klebsiella oxytoca NOT DETECTED NOT DETECTED Final   Klebsiella pneumoniae NOT DETECTED NOT DETECTED Final   Proteus species NOT DETECTED  NOT DETECTED Final   Serratia marcescens NOT DETECTED NOT DETECTED Final   Haemophilus influenzae NOT DETECTED NOT DETECTED Final   Neisseria meningitidis NOT DETECTED NOT DETECTED Final   Pseudomonas aeruginosa NOT DETECTED NOT DETECTED Final   Candida albicans NOT DETECTED NOT DETECTED Final   Candida glabrata NOT DETECTED NOT DETECTED Final   Candida krusei NOT DETECTED NOT DETECTED Final   Candida parapsilosis NOT DETECTED NOT DETECTED Final   Candida tropicalis NOT DETECTED NOT DETECTED Final    Comment: Performed at [facility], [place], [place], [place] [address]  Culture, blood (routine x 2)     Status: None   Collection Time: 11/15/18  1:13 PM  Result Value Ref Range Status   Specimen Description BLOOD BLOOD RIGHT HAND  Final   Special Requests IN PEDIATRIC BOTTLE Blood Culture adequate volume  Final   Culture   Final    NO GROWTH 5 DAYS Performed at [facility], [place] [address] [place], [place] [address]    Report Status 11/20/2018 FINAL  Final  Fungus culture, blood     Status: None (Preliminary result)   Collection Time: 11/15/18  1:13 PM  Result Value Ref Range Status   Specimen Description BLOOD BLOOD RIGHT HAND  Final   Special Requests IN PEDIATRIC BOTTLE Blood Culture adequate volume  Final   Culture   Final    NO GROWTH 6 DAYS Performed at [facility], [place] [address], [place], [place] [address]    Report Status PENDING  Incomplete    Coagulation Studies: No results for input(s): LABPROT, INR in the last 72 hours.  Urinalysis: No results for input(s): COLORURINE, LABSPEC, PHURINE, GLUCOSEU, HGBUR, BILIRUBINUR, KETONESUR, PROTEINUR, UROBILINOGEN, NITRITE, LEUKOCYTESUR in the last 72 hours.  Invalid input(s): APPERANCEUR    Imaging: Dg Chest Port 1 View  Result Date: 11/22/2018 CLINICAL DATA:  Acute respiratory failure with hypoxia. EXAM: PORTABLE CHEST 1 VIEW COMPARISON:  11/19/2018. FINDINGS: Unchanged cardiomediastinal  silhouette. Support tubes and lines remain stable. BILATERAL pulmonary opacities are not significantly improved. IMPRESSION: Stable chest. Electronically Signed   By: Staci Righter M.D.   On: 11/22/2018 07:39     Medications:   .  prismasol BGK 4/2.5 800 mL/hr at 11/22/18 1022  .  prismasol BGK 4/2.5 300 mL/hr at 11/21/18 2131  . sodium chloride    .  sodium chloride 10 mL/hr at 11/22/18 1200  . heparin 10,000 units/ 20 mL infusion syringe 1,900 Units/hr (11/22/18 0735)  . heparin 999 mL/hr at 11/20/18 0645  . norepinephrine (LEVOPHED) Adult infusion 12 mcg/min (11/22/18 1200)  . prismasol BGK 4/2.5 2,000 mL/hr at 11/22/18 1024   . chlorhexidine  15 mL Mouth Rinse BID  . feeding supplement (PRO-STAT SUGAR FREE 64)  30 mL Per Tube BID  . feeding supplement (VITAL AF 1.2 CAL)  1,000 mL Per Tube Q24H  . folic acid  1 mg Oral Daily  . HYDROmorphone  1 mg Per Tube Q12H  . insulin aspart  0-9 Units Subcutaneous Q4H  . mouth rinse  15 mL Mouth Rinse q12n4p  . midodrine  10 mg Oral TID WC  . multivitamin  15 mL Oral Daily  . pantoprazole sodium  40 mg Per Tube Daily  . sodium chloride flush  10-40 mL Intracatheter Q12H  . thiamine injection  100 mg Intravenous Daily   Place/Maintain arterial line **AND** sodium chloride, sodium chloride, acetaminophen, docusate, heparin, heparin, heparin, HYDROmorphone (DILAUDID) injection, ondansetron (ZOFRAN) IV, sennosides, sodium chloride flush  Assessment/ Plan:   Acute kidney injury from ischemic ATN CRRT discontinued 11/04/2018 but was restarted 11/07/2018.  11/13/2018 insertion of femoral dialysis catheter.  Filters of clotted added back heparin  Septic shock with necrotizing pancreatitis patient   Blood cultures negative.  CT scan 11/12/2018 with contrast necrosis on more organized fluid collections.   Recrudescence of fever no antibiotics at this time.  Will need pan culturing and reinitiation of antibiotics per critical care.  May need line  replacement.  Acute hypoxic respiratory failure status post trach 11/08/2018 decreased Fi02  Hypocalcemia 2.5 calcium bath this corrects for hypoalbuminemia  Anemia transfuse as needed  Nutrition status post TPN has GJ tube placed.  Possible pericarditis with TEE revealing trivial pericardial effusion    Temporary hemodialysis catheter removed 12/3 reinserted 11/13/2018  Leukemoid reaction increase white count had received stress dose steroids  Hypokalemia resolved  Hypophosphatemia we will add phosphorus  metabolic acidosis resolved  DNR status.     LOS: [place] @TODAY @12 :13 PM

## 2018-11-23 ENCOUNTER — Inpatient Hospital Stay (HOSPITAL_COMMUNITY): Payer: Medicare Other

## 2018-11-23 LAB — CBC
HCT: 23.2 % — ABNORMAL LOW (ref 39.0–52.0)
Hemoglobin: 7.2 g/dL — ABNORMAL LOW (ref 13.0–17.0)
MCH: 28.3 pg (ref 26.0–34.0)
MCHC: 31 g/dL (ref 30.0–36.0)
MCV: 91.3 fL (ref 80.0–100.0)
Platelets: 171 10*3/uL (ref 150–400)
RBC: 2.54 MIL/uL — ABNORMAL LOW (ref 4.22–5.81)
RDW: 22.7 % — ABNORMAL HIGH (ref 11.5–15.5)
WBC: 44.4 10*3/uL — ABNORMAL HIGH (ref 4.0–10.5)
nRBC: 0 % (ref 0.0–0.2)

## 2018-11-23 LAB — RENAL FUNCTION PANEL
ANION GAP: 9 (ref 5–15)
Albumin: 1.4 g/dL — ABNORMAL LOW (ref 3.5–5.0)
Albumin: 1.3 g/dL — ABNORMAL LOW (ref 3.5–5.0)
Anion gap: 13 (ref 5–15)
BUN: 24 mg/dL — ABNORMAL HIGH (ref 8–23)
BUN: 22 mg/dL (ref 8–23)
CO2: 23 mmol/L (ref 22–32)
CO2: 24 mmol/L (ref 22–32)
Calcium: 7.4 mg/dL — ABNORMAL LOW (ref 8.9–10.3)
Calcium: 7.3 mg/dL — ABNORMAL LOW (ref 8.9–10.3)
Chloride: 101 mmol/L (ref 98–111)
Chloride: 105 mmol/L (ref 98–111)
Creatinine, Ser: 1.47 mg/dL — ABNORMAL HIGH (ref 0.61–1.24)
Creatinine, Ser: 1.39 mg/dL — ABNORMAL HIGH (ref 0.61–1.24)
GFR calc Af Amer: 56 mL/min — ABNORMAL LOW (ref >60)
GFR calc Af Amer: >60 mL/min (ref >60)
GFR calc non Af Amer: 52 mL/min — ABNORMAL LOW (ref >60)
GFR, EST NON AFRICAN AMERICAN: 49 mL/min — AB (ref >60)
Glucose, Bld: 136 mg/dL — ABNORMAL HIGH (ref 70–99)
Glucose, Bld: 149 mg/dL — ABNORMAL HIGH (ref 70–99)
Phosphorus: 2.2 mg/dL — ABNORMAL LOW (ref 2.5–4.6)
Phosphorus: 2.1 mg/dL — ABNORMAL LOW (ref 2.5–4.6)
Potassium: 4.2 mmol/L (ref 3.5–5.1)
Potassium: 4 mmol/L (ref 3.5–5.1)
SODIUM: 138 mmol/L (ref 135–145)
Sodium: 137 mmol/L (ref 135–145)

## 2018-11-23 LAB — GLUCOSE, CAPILLARY
GLUCOSE-CAPILLARY: 161 mg/dL — AB (ref 70–99)
Glucose-Capillary: 117 mg/dL — ABNORMAL HIGH (ref 70–99)
Glucose-Capillary: 129 mg/dL — ABNORMAL HIGH (ref 70–99)
Glucose-Capillary: 134 mg/dL — ABNORMAL HIGH (ref 70–99)
Glucose-Capillary: 138 mg/dL — ABNORMAL HIGH (ref 70–99)
Glucose-Capillary: 142 mg/dL — ABNORMAL HIGH (ref 70–99)
Glucose-Capillary: 152 mg/dL — ABNORMAL HIGH (ref 70–99)
Glucose-Capillary: 153 mg/dL — ABNORMAL HIGH (ref 70–99)
Glucose-Capillary: 155 mg/dL — ABNORMAL HIGH (ref 70–99)

## 2018-11-23 LAB — APTT: aPTT: 71 seconds — ABNORMAL HIGH (ref 24–36)

## 2018-11-23 LAB — MAGNESIUM: Magnesium: 2.8 mg/dL — ABNORMAL HIGH (ref 1.7–2.4)

## 2018-11-23 LAB — POCT ACTIVATED CLOTTING TIME
ACTIVATED CLOTTING TIME: 197 s
Activated Clotting Time: 186 seconds
Activated Clotting Time: 191 seconds
Activated Clotting Time: 191 seconds
Activated Clotting Time: 191 seconds
Activated Clotting Time: 197 seconds
Activated Clotting Time: 191 seconds

## 2018-11-23 MED ORDER — SODIUM CHLORIDE 0.9 % IV BOLUS
1000.0000 mL | Freq: Once | INTRAVENOUS | Status: AC
  Administered 2018-11-24: 1000 mL via INTRAVENOUS

## 2018-11-23 MED ORDER — SODIUM CHLORIDE 0.9 % IV BOLUS
1000.0000 mL | Freq: Once | INTRAVENOUS | Status: AC
  Administered 2018-11-23: 1000 mL via INTRAVENOUS

## 2018-11-23 MED ORDER — ALBUMIN HUMAN 5 % IV SOLN
12.5000 g | Freq: Once | INTRAVENOUS | Status: AC
  Administered 2018-11-23: 12.5 g via INTRAVENOUS
  Filled 2018-11-23: qty 250

## 2018-11-23 MED ORDER — DARBEPOETIN ALFA 200 MCG/0.4ML IJ SOSY
200.0000 ug | PREFILLED_SYRINGE | INTRAMUSCULAR | Status: DC
  Administered 2018-11-23: 200 ug via SUBCUTANEOUS
  Filled 2018-11-23: qty 0.4

## 2018-11-23 NOTE — Progress Notes (Signed)
Subjective:  Remains on pressors overnight- CRRT running well  Objective Vital signs in last 24 hours: Vitals:   11/23/18 0530 11/23/18 0600 11/23/18 0630 11/23/18 0700  BP: (!) 90/43 (!) 85/55 (!) 94/46 (!) 95/42  Pulse: 78 81 80 80  Resp: (!) 29 (!) 23 (!) 27 (!) 26  Temp:      TempSrc:      SpO2: 93% 96% 95% 94%  Weight:      Height:       Weight change: -0.2 kg  Intake/Output Summary (Last 24 hours) at 11/23/2018 0708 Last data filed at 11/23/2018 0700 Gross per 24 hour  Intake 1979.8 ml  Output 1929 ml  Net 50.8 ml    Assessment/ Plan: Pt is a 67 y.o. yo male - baseline crt of 1 who was admitted on 10/14/2018 with gallstone pancreatitis- has developed AKI as complication  Assessment/Plan: 1. Renal- AKI and been RRT dependent in some form since November 22 or so.  Currently on CRRT via vascath placed 12/6.  No UOP recorded 2. Pancreatitis- supportive care per CCM.  Has trach.  WBC elevated - leukemoid reaction/steriods 3. Anemia- hgb in the 7's- transfuse PRN- will also add ESA  4. Elytes- K and phos OK for today.  Corrected calcium is OK  5. HTN/volume- CVP low- has edema but may be third spacing- will give a little fluid back and make sure running even   [employer]    Labs: Basic Metabolic Panel: Recent Labs  Lab 11/21/18 1639 11/22/18 0611 11/22/18 1520  NA 136 137 134*  K 4.4 4.4 4.3  CL 102 102 102  CO2 26 23 22   GLUCOSE 197* 145* 184*  BUN 29* 26* 26*  CREATININE 1.48* 1.44* 1.43*  CALCIUM 7.4* 7.2* 7.1*  PHOS 2.0* 1.9* 2.7   Liver Function Tests: Recent Labs  Lab 11/21/18 1639 11/22/18 0611 11/22/18 1520  ALBUMIN 1.4* 1.5* 1.5*   No results for input(s): LIPASE, AMYLASE in the last 168 hours. No results for input(s): AMMONIA in the last 168 hours. CBC: Recent Labs  Lab 11/19/18 0329 11/20/18 0314 11/21/18 0332 11/22/18 0440 11/23/18 0510  WBC 35.4* 29.3* 32.4* 34.4* 44.4*  HGB 7.9* 7.6* 7.6* 7.0* 7.2*  HCT 24.5* 24.6*  23.6* 22.2* 23.2*  MCV 90.7 92.5 91.8 91.7 91.3  PLT 292 197 159 144* 171   Cardiac Enzymes: No results for input(s): CKTOTAL, CKMB, CKMBINDEX, TROPONINI in the last 168 hours. CBG: Recent Labs  Lab 11/22/18 1131 11/22/18 1518 11/22/18 1949 11/22/18 2320 11/23/18 0328  GLUCAP 110* 155* 161* 129* 117*    Iron Studies: No results for input(s): IRON, TIBC, TRANSFERRIN, FERRITIN in the last 72 hours. Studies/Results: Dg Chest Port 1 View  Result Date: 11/23/2018 CLINICAL DATA:  Ventilator support.  Hypoxia. EXAM: PORTABLE CHEST 1 VIEW COMPARISON:  11/22/2018 FINDINGS: Tracheostomy, nasogastric 2, soft feeding tube and left internal jugular central line appear the same. Infiltrate/volume loss in both lower lobes appear the same. No worsening or new finding. IMPRESSION: Lines and tubes well positioned. No change in bilateral lower lobe infiltrate/atelectasis. Electronically Signed   By: Nelson Chimes M.D.   On: 11/23/2018 06:48   Dg Chest Port 1 View  Result Date: 11/22/2018 CLINICAL DATA:  Acute respiratory failure with hypoxia. EXAM: PORTABLE CHEST 1 VIEW COMPARISON:  11/19/2018. FINDINGS: Unchanged cardiomediastinal silhouette. Support tubes and lines remain stable. BILATERAL pulmonary opacities are not significantly improved. IMPRESSION: Stable chest. Electronically Signed   By: Roderic Ovens.D.  On: 11/22/2018 07:39   Medications: Infusions: .  prismasol BGK 4/2.5 800 mL/hr at 11/23/18 0502  .  prismasol BGK 4/2.5 300 mL/hr at 11/22/18 1439  . sodium chloride    . sodium chloride 10 mL/hr at 11/23/18 0700  . heparin 10,000 units/ 20 mL infusion syringe 1,950 Units/hr (11/23/18 0425)  . heparin 999 mL/hr at 11/20/18 0645  . norepinephrine (LEVOPHED) Adult infusion 12 mcg/min (11/23/18 0700)  . prismasol BGK 4/2.5 2,000 mL/hr at 11/23/18 0543    Scheduled Medications: . chlorhexidine  15 mL Mouth Rinse BID  . feeding supplement (PRO-STAT SUGAR FREE 64)  30 mL Per Tube BID   . feeding supplement (VITAL AF 1.2 CAL)  1,000 mL Per Tube Q24H  . folic acid  1 mg Oral Daily  . HYDROmorphone  1 mg Per Tube Q12H  . insulin aspart  0-9 Units Subcutaneous Q4H  . mouth rinse  15 mL Mouth Rinse q12n4p  . midodrine  10 mg Oral TID WC  . multivitamin  15 mL Oral Daily  . pantoprazole sodium  40 mg Per Tube Daily  . sodium chloride flush  10-40 mL Intracatheter Q12H  . thiamine injection  100 mg Intravenous Daily    have reviewed scheduled and prn medications.  Physical Exam: General: arousable, seems to understand Heart: RRR Lungs: CBS bilat Abdomen: distended Extremities: some pitting edema Dialysis Access: femoral vascath placed 12/6    11/23/2018,7:08 AM  LOS: 26 days

## 2018-11-23 NOTE — Progress Notes (Signed)
NAME:  Derek Riley, MRN:  [id number], DOB:  06-03-1951, LOS: 26 ADMISSION DATE:  10/24/2018, CONSULTATION DATE:  11/22 REFERRING MD:  11/22, CHIEF COMPLAINT:  Worsening metabolic acidosis and renal failure w/ clinical decline  Brief History   67 year old male admitted 11/20 w/ gallstone pancreatitis. PCCM consulted 11/22 for progressive metabolic acidosis, acute renal failure and persistent clinical decline.   Past Medical History  Diabetes, gallstones, hypertension.  Significant Hospital Events   11/20 transferred to [facility] from [place] w/ acute gallstone pancreatitis. GI & surgicals services consulted. Started On supportive IVFs, empiric zosyn and analgesic support.  Surgical service is planning on cholecystectomy once pancreatitis improved.  Initial lipase on arrival to [place], creatinine 1.72 11/21: MRCP 11/21 showing extensive pancreatitis, possible hemorrhagic peripancreatic fluid no choledocholithiasis, lipase 3361, creatinine up to 4.91, anion gap 13.  No role for ERCP continued supportive care 11/22: Creatinine up to 6.95, gap now 16, progressive diffuse mottling, worse pain, critical care consulted for shock, and progressive multiorgan failure, moving to intensive care for CRRT as well as hemodynamic support. 3.7 liters + since admit, developed worsening resp failure requiring intubation shortly after arrival. Possible aspiration during intubation  11/26 started on meropenem (d/t rising CBC) CRRT stopped 11/27 extubated, pressors off.  11/28 look weaker. WIll probably need re-intubation. 11/29 reintubated for increasing WOB, likely aspiration even overnight.  11/30: Remains intubated.  White blood cell count now up to 62,000.  CRRT resumed 12/1: No significant change.  Ongoing cuff leak opting for early tracheostomy versus endotracheal tube change.  12/2: tolerating PS wean at 10/5, CT of the abdomen/pelvis ordered 12/6: Initiation of CRRT to remove significant  cumulative fluid balance.  Consults:  11/20 GI and surgical services 11/22 critical care  Procedures:  11/22 right IJ CVL (HD)>>> 11/22 OETT 11/22>>>12/1 Trach 12/1 (JY)>>>  Significant Diagnostic Tests:  MRCP 11/21: Acute pancreatitis with extensive pancreatic and periPancreatic inflammatory changes, including proteinaceous and/or hemorrhagic peri-pancreatic fluid.  No choledocholithiasis or biliary tract obstruction.  Small volume of ascites, trace bilateral pleural effusions.  Bilateral basilar volume loss  CT abdomen/pelvis 10/9>> persistent peripancreatic edema and fluid, slightly more organized, small area of apparent pancreatic necrosis, no intra-or extrahepatic biliary ductal dilatation, small bilateral pleural effusions with lower lobe atelectatic change  Micro Data:  Cultures negative  Antimicrobials:  Zosyn 11/20>>>11/26 Meropenem 11/26>>> 11/11/2018 Anidulafungin 12/8 >> 12/11 Meropenem 12/8 >> 12/12  Interim history/subjective:  No events overnight, remains on pressors and CRRT CVP 6, will give fluid back  Objective   Blood pressure (!) 82/37, pulse 81, temperature (!) 100.9 F (38.3 C), temperature source Axillary, resp. rate (!) 25, height 6' (1.829 m), weight 78.4 kg, SpO2 97 %. CVP:  [2 mmHg-8 mmHg] 2 mmHg  FiO2 (%):  [28 %] 28 %   Intake/Output Summary (Last 24 hours) at 11/23/2018 1042 Last data filed at 11/23/2018 1000 Gross per 24 hour  Intake 2194.99 ml  Output 1822 ml  Net 372.99 ml   Filed Weights   11/21/18 0200 11/22/18 0500 11/23/18 0400  Weight: 78.6 kg 78.6 kg 78.4 kg    Examination: General: Jaundiced, chronically ill appearing male HEENT: Scleral icterus, [place]/AT, PERRL, EOM-I and DMM Neuro: Awake and follows commands CV: RRR, Nl S1/S2 and -M/R/G PULM: Decreased BS diffusely GI: Soft, NT, ND and +BS Extremities: Trace to 1+ lower extremity edema Skin: No rashes   Resolved Hospital Problem list     Assessment & Plan:   Acute  gallstone  pancreatitis development of pancreatic necrosis no obvious abscess or cystic formation. -No evidence of choledocholelithiasis on MRCP -CT abdomen reassuring 12/9 Plan Currently following off of antimicrobials (he has had 2 discrete courses), will monitor off abx He had been tolerating postpyloric tube feeding but he had emesis overnight and the rate was decreased.  He still has gastric suctioning and secretions Will need G-tube pending conversation in AM  Shock.  Suspect distributive, in part related to volume removal with CVVH. Titrate Norepi for MAP of 65 Running CVVHD positive 100 ml/hr Midodrin added 12/14, continue current dose  Ileus Tolerating postpyloric tube feeding Pending conversation in AM will decide on GJ tube  Ongoing leukocytosis in setting of pancreatic inflammation Plan Elevated but stable  Acute hypoxic respiratory failure requiring mechanical ventilation in the setting of acute aspiration event 11/29, progressive atelectasis, and decreased abdominal compliance from pain and impaired cough status post tracheostomy currently requiring ventilatory support. Plan TC as tolerated Remove vent from room Continue work with PMV, speaking, hopefully will be able to consider swallowing sometime soon  Severe deconditioning and protein calorie malnutrition Plan Continue TF He may still ultimately require a GJ tube depending on his wishes for his care.  They are contemplating whether he would truly want long-term care, LTAC care, SNF care  Acute kidney injury with baseline creatinine 1.  This was hemodynamically mediated plus/minus contrast-induced nephropathy Plan Likely transition CVVHD to intermittent HD once his blood pressure will tolerate.  Appreciate nephrology assistance, note that Midodrin was added on 12/14  Hyperglycemia, then also periods of hypoglycemia Plan Sliding scale insulin per protocol  Elevated cumulative fluid balance and electrolyte  imbalance: Hyperkalemia Plan Continue to follow renal panel Replete electrolytes as indicated His fluid balance is now even for the admission  Met with patient's niece, expressed concern about lack of progress.  They are equally as concerned as they do not believe patient would want this.  At this point, will continue current level of care and plan meeting in AM with the entire family and the patient to make decisions about plan of care.  The patient is critically ill with multiple organ systems failure and requires high complexity decision making for assessment and support, frequent evaluation and titration of therapies, application of advanced monitoring technologies and extensive interpretation of multiple databases.   Critical Care Time devoted to patient care services described in this note is  34  Minutes. This time reflects time of care of this signee Dr Jennet Maduro. This critical care time does not reflect procedure time, or teaching time or supervisory time of PA/NP/Med student/Med Resident etc but could involve care discussion time.  Rush Farmer, M.D. [facility] Pulmonary/Critical Care Medicine. Pager: [phone]. After hours pager: [phone].

## 2018-11-23 NOTE — Care Management Note (Signed)
Case Management Note  Patient Details  Name: LEVY CEDANO MRN: [id number] Date of Birth: 1951/10/26  Subjective/Objective:   Pt admitted with necrotizing pancreatitis                 Action/Plan:    PTA from home.   Pt remains ventilated via ETT tube on IV sedation.  Pt requiring ongoing HD however CRRT paused for line holiday.    Expected Discharge Date:                  Expected Discharge Plan:  Long Term Acute Care (LTAC)  In-House Referral:  Clinical Social Work  Discharge planning Services  CM Consult  Post Acute Care Choice:    Choice offered to:     DME Arranged:    DME Agency:     HH Arranged:    [place] Agency:     Status of Service:     If discussed at [employer], dates discussed:    Additional Comments: 11/23/2018  Pt is s/p trach - able to go on trach collar over the weekend.  Pt still requiring CRRT - will transition to IHD once BP can tolerate, still on pressors. Still  has NG tube for tube feeds - barriers with abdominal tube placement due to current illness.   [place] discussions ongoing  11/13/18 If pt requires trach and able to stay of CRRT - pt may benefit from [facility] referral.  CM will continue to follow [name], RN 11/23/2018, 9:07 AM

## 2018-11-23 NOTE — Progress Notes (Signed)
Liter of Normal Saline started and to infuse at 100 ml/hr and orders to not remove volume with CRRT per Dr Moshe Cipro.

## 2018-11-23 NOTE — Progress Notes (Signed)
[employer] Progress Note Patient Name: Derek Riley DOB: 12-23-1950 MRN: [id number]   Date of Service  11/23/2018  HPI/Events of Note  Hypotension - BP = 92/36 with MAP = 53. CVP = 2. Albumin = 1.3.   eICU Interventions  Will order: 1. 5% Albumin 12.5 gm IV now. 2. Defer volume status and CRRT questions to renal service.      Intervention Category Major Interventions: Hypovolemia - evaluation and treatment with fluids  Kamaree Wheatley Eugene 11/23/2018, 10:39 PM

## 2018-11-24 DIAGNOSIS — Z515 Encounter for palliative care: Secondary | ICD-10-CM

## 2018-11-24 DIAGNOSIS — Z7189 Other specified counseling: Secondary | ICD-10-CM

## 2018-11-24 LAB — POCT ACTIVATED CLOTTING TIME
ACTIVATED CLOTTING TIME: 191 s
Activated Clotting Time: 191 seconds
Activated Clotting Time: 197 seconds
Activated Clotting Time: 196 seconds
Activated Clotting Time: 186 seconds

## 2018-11-24 LAB — RENAL FUNCTION PANEL
Albumin: 1.6 g/dL — ABNORMAL LOW (ref 3.5–5.0)
Albumin: 1.5 g/dL — ABNORMAL LOW (ref 3.5–5.0)
Anion gap: 11 (ref 5–15)
Anion gap: 13 (ref 5–15)
BUN: 21 mg/dL (ref 8–23)
BUN: 19 mg/dL (ref 8–23)
CHLORIDE: 103 mmol/L (ref 98–111)
CO2: 23 mmol/L (ref 22–32)
CO2: 20 mmol/L — ABNORMAL LOW (ref 22–32)
Calcium: 7.3 mg/dL — ABNORMAL LOW (ref 8.9–10.3)
Calcium: 7.4 mg/dL — ABNORMAL LOW (ref 8.9–10.3)
Chloride: 105 mmol/L (ref 98–111)
Creatinine, Ser: 1.38 mg/dL — ABNORMAL HIGH (ref 0.61–1.24)
Creatinine, Ser: 1.46 mg/dL — ABNORMAL HIGH (ref 0.61–1.24)
GFR calc Af Amer: >60 mL/min (ref >60)
GFR calc Af Amer: 57 mL/min — ABNORMAL LOW (ref >60)
GFR calc non Af Amer: 53 mL/min — ABNORMAL LOW (ref >60)
GFR calc non Af Amer: 49 mL/min — ABNORMAL LOW (ref >60)
Glucose, Bld: 141 mg/dL — ABNORMAL HIGH (ref 70–99)
Glucose, Bld: 145 mg/dL — ABNORMAL HIGH (ref 70–99)
Phosphorus: 2.3 mg/dL — ABNORMAL LOW (ref 2.5–4.6)
Phosphorus: 3 mg/dL (ref 2.5–4.6)
Potassium: 4 mmol/L (ref 3.5–5.1)
Potassium: 4.4 mmol/L (ref 3.5–5.1)
Sodium: 137 mmol/L (ref 135–145)
Sodium: 138 mmol/L (ref 135–145)

## 2018-11-24 LAB — CULTURE, BLOOD (ROUTINE X 2): Special Requests: ADEQUATE

## 2018-11-24 LAB — GLUCOSE, CAPILLARY
Glucose-Capillary: 145 mg/dL — ABNORMAL HIGH (ref 70–99)
Glucose-Capillary: 138 mg/dL — ABNORMAL HIGH (ref 70–99)
Glucose-Capillary: 104 mg/dL — ABNORMAL HIGH (ref 70–99)
Glucose-Capillary: 136 mg/dL — ABNORMAL HIGH (ref 70–99)
Glucose-Capillary: 143 mg/dL — ABNORMAL HIGH (ref 70–99)

## 2018-11-24 LAB — FUNGUS CULTURE, BLOOD
Culture: NO GROWTH
Culture: NO GROWTH
SPECIAL REQUESTS: ADEQUATE
Special Requests: ADEQUATE

## 2018-11-24 LAB — MAGNESIUM: Magnesium: 2.5 mg/dL — ABNORMAL HIGH (ref 1.7–2.4)

## 2018-11-24 LAB — APTT: aPTT: 63 seconds — ABNORMAL HIGH (ref 24–36)

## 2018-11-24 MED ORDER — SODIUM CHLORIDE 0.9 % IV SOLN
INTRAVENOUS | Status: AC
  Administered 2018-11-24: 08:00:00 via INTRAVENOUS

## 2018-11-24 MED ORDER — HYDROMORPHONE HCL 1 MG/ML IJ SOLN
0.5000 mg | INTRAMUSCULAR | Status: DC | PRN
  Administered 2018-11-24: 0.5 mg via INTRAVENOUS
  Filled 2018-11-24: qty 0.5

## 2018-11-24 MED ORDER — HYDROMORPHONE HCL 1 MG/ML IJ SOLN
0.5000 mg | INTRAMUSCULAR | Status: DC | PRN
  Administered 2018-11-24 – 2018-11-25 (×6): 0.5 mg via INTRAVENOUS
  Filled 2018-11-24 (×7): qty 0.5

## 2018-11-24 MED ORDER — CHLORHEXIDINE GLUCONATE CLOTH 2 % EX PADS
6.0000 | MEDICATED_PAD | Freq: Every day | CUTANEOUS | Status: DC
  Administered 2018-11-24 – 2018-11-25 (×2): 6 via TOPICAL

## 2018-11-24 NOTE — Progress Notes (Signed)
I called patient's Brother [facility] and updated him that we had stopped Dialysis due to patient's Blood pressure is extremely low .

## 2018-11-24 NOTE — Progress Notes (Signed)
PT Cancellation Note  Patient Details Name: Derek Riley MRN: [id number] DOB: 11/11/1951   Cancelled Treatment:    Reason Eval/Treat Not Completed: Medical issues which prohibited therapy.  Per RN pt , "not doing well today" and recommended holding.  Having significant amounts of pain.  PT to check back tomorrow.  Thanks,  Barbarann Ehlers. Jovann Luse, PT, DPT  Acute Rehabilitation [phone] pager [phone]) [phone] office     Wells Guiles B Candon Caras 11/24/2018, 12:34 PM

## 2018-11-24 NOTE — Progress Notes (Signed)
Patient continues to have 10 of 10 abdominal pain despite increased dose of Dilaudid . His abdomen is firm and taut tube feeding remains off . Dr Nelda Marseille informed and orders received to increase frequency of Dilaudid to every one hour prn.

## 2018-11-24 NOTE — Progress Notes (Signed)
SLP Cancellation Note  Patient Details Name: Derek Riley MRN: [id number] DOB: [birth year]   Cancelled treatment:       Reason Eval/Treat Not Completed: Other (comment) Pt having family meeting with MD this morning when SLP was on unit. Per chart review, pt is now having difficulty with significant pain. Will defer treatment for today.   Germain Osgood 11/24/2018, 1:26 PM  Germain Osgood, M.A. [place] Acute Environmental education officer [phone] Office [phone]

## 2018-11-24 NOTE — Progress Notes (Signed)
Subjective:  Remains on pressors overnight, some hypotension- CRRT running well - ended up positive 2 liters- CVP 6 this AM  Objective Vital signs in last 24 hours: Vitals:   11/24/18 0615 11/24/18 0630 11/24/18 0645 11/24/18 0700  BP:  (!) 94/42  (!) 96/44  Pulse: 83 80 80 85  Resp: (!) 23 17 (!) 24 (!) 23  Temp:      TempSrc:      SpO2: 96% 96% 95% 95%  Weight:      Height:       Weight change: 0.9 kg  Intake/Output Summary (Last 24 hours) at 11/24/2018 0715 Last data filed at 11/24/2018 0700 Gross per 24 hour  Intake 3912.92 ml  Output 1581 ml  Net 2331.92 ml    Assessment/ Plan: Pt is a 67 y.o. yo male - baseline crt of 1 who was admitted on 10/23/2018 with gallstone pancreatitis- has developed AKI as complication  Assessment/Plan: 1. Renal- AKI and been RRT dependent in some form since November 22 or so.  Currently on CRRT via vascath placed 78/29- now 31 days old.  Will need new line- I can ask IR to place a tunneled cath, they may be concerned regarding elevated WBC but has been covered.  No UOP recorded so feel like will need to continue with RRT 2. Pancreatitis- supportive care per CCM.  Has trach.  WBC elevated - leukemoid reaction/steriods 3. Anemia- hgb in the 7's- transfuse PRN- have also added ESA  4. Elytes- K and phos OK for today.  Corrected calcium is OK  5. HTN/volume- CVP low- has edema but may be third spacing- will continue to give a little fluid back and make sure running even /positive  Louis Meckel    Labs: Basic Metabolic Panel: Recent Labs  Lab 11/23/18 0510 11/23/18 1517 11/24/18 0343  NA 137 138 137  K 4.2 4.0 4.0  CL 101 105 103  CO2 23 24 23   GLUCOSE 136* 149* 141*  BUN 24* 22 21  CREATININE 1.47* 1.39* 1.38*  CALCIUM 7.4* 7.3* 7.3*  PHOS 2.2* 2.1* 2.3*   Liver Function Tests: Recent Labs  Lab 11/23/18 0510 11/23/18 1517 11/24/18 0343  ALBUMIN 1.4* 1.3* 1.6*   No results for input(s): LIPASE, AMYLASE in the last 168  hours. No results for input(s): AMMONIA in the last 168 hours. CBC: Recent Labs  Lab 11/19/18 0329 11/20/18 0314 11/21/18 0332 11/22/18 0440 11/23/18 0510  WBC 35.4* 29.3* 32.4* 34.4* 44.4*  HGB 7.9* 7.6* 7.6* 7.0* 7.2*  HCT 24.5* 24.6* 23.6* 22.2* 23.2*  MCV 90.7 92.5 91.8 91.7 91.3  PLT 292 197 159 144* 171   Cardiac Enzymes: No results for input(s): CKTOTAL, CKMB, CKMBINDEX, TROPONINI in the last 168 hours. CBG: Recent Labs  Lab 11/23/18 1123 11/23/18 1529 11/23/18 1930 11/23/18 2333 11/24/18 0348  GLUCAP 152* 134* 138* 142* 145*    Iron Studies: No results for input(s): IRON, TIBC, TRANSFERRIN, FERRITIN in the last 72 hours. Studies/Results: Dg Chest Port 1 View  Result Date: 11/23/2018 CLINICAL DATA:  Ventilator support.  Hypoxia. EXAM: PORTABLE CHEST 1 VIEW COMPARISON:  11/22/2018 FINDINGS: Tracheostomy, nasogastric 2, soft feeding tube and left internal jugular central line appear the same. Infiltrate/volume loss in both lower lobes appear the same. No worsening or new finding. IMPRESSION: Lines and tubes well positioned. No change in bilateral lower lobe infiltrate/atelectasis. Electronically Signed   By: Nelson Chimes M.D.   On: 11/23/2018 06:48   Medications: Infusions: .  prismasol  BGK 4/2.5 800 mL/hr at 11/24/18 0650  .  prismasol BGK 4/2.5 300 mL/hr at 11/24/18 0100  . sodium chloride    . sodium chloride 10 mL/hr at 11/24/18 0700  . heparin 10,000 units/ 20 mL infusion syringe 1,950 Units/hr (11/24/18 0342)  . heparin 999 mL/hr at 11/20/18 0645  . norepinephrine (LEVOPHED) Adult infusion 10 mcg/min (11/24/18 0700)  . prismasol BGK 4/2.5 2,000 mL/hr at 11/24/18 0535    Scheduled Medications: . chlorhexidine  15 mL Mouth Rinse BID  . Chlorhexidine Gluconate Cloth  6 each Topical Daily  . darbepoetin (ARANESP) injection - NON-DIALYSIS  200 mcg Subcutaneous Q Mon-1800  . feeding supplement (PRO-STAT SUGAR FREE 64)  30 mL Per Tube BID  . feeding supplement  (VITAL AF 1.2 CAL)  1,000 mL Per Tube Q24H  . folic acid  1 mg Oral Daily  . HYDROmorphone  1 mg Per Tube Q12H  . insulin aspart  0-9 Units Subcutaneous Q4H  . mouth rinse  15 mL Mouth Rinse q12n4p  . midodrine  10 mg Oral TID WC  . multivitamin  15 mL Oral Daily  . pantoprazole sodium  40 mg Per Tube Daily  . sodium chloride flush  10-40 mL Intracatheter Q12H  . thiamine injection  100 mg Intravenous Daily    have reviewed scheduled and prn medications.  Physical Exam: General: arousable, seems to understand Heart: RRR Lungs: CBS bilat Abdomen: distended Extremities: some pitting edema Dialysis Access: femoral vascath placed 12/6    11/24/2018,7:15 AM  LOS: 27 days

## 2018-11-24 NOTE — Progress Notes (Signed)
NAME:  Derek Riley, MRN:  [id number], DOB:  21-Dec-1950, LOS: 55 ADMISSION DATE:  11/02/2018, CONSULTATION DATE:  11/22 REFERRING MD:  11/22, CHIEF COMPLAINT:  Worsening metabolic acidosis and renal failure w/ clinical decline  Brief History   67 year old male admitted 11/20 w/ gallstone pancreatitis. PCCM consulted 11/22 for progressive metabolic acidosis, acute renal failure and persistent clinical decline.   Past Medical History  Diabetes, gallstones, hypertension.  Significant Hospital Events   11/20 transferred to [facility] from [place] w/ acute gallstone pancreatitis. GI & surgicals services consulted. Started On supportive IVFs, empiric zosyn and analgesic support.  Surgical service is planning on cholecystectomy once pancreatitis improved.  Initial lipase on arrival to [place], creatinine 1.72 11/21: MRCP 11/21 showing extensive pancreatitis, possible hemorrhagic peripancreatic fluid no choledocholithiasis, lipase 3361, creatinine up to 4.91, anion gap 13.  No role for ERCP continued supportive care 11/22: Creatinine up to 6.95, gap now 16, progressive diffuse mottling, worse pain, critical care consulted for shock, and progressive multiorgan failure, moving to intensive care for CRRT as well as hemodynamic support. 3.7 liters + since admit, developed worsening resp failure requiring intubation shortly after arrival. Possible aspiration during intubation  11/26 started on meropenem (d/t rising CBC) CRRT stopped 11/27 extubated, pressors off.  11/28 look weaker. WIll probably need re-intubation. 11/29 reintubated for increasing WOB, likely aspiration even overnight.  11/30: Remains intubated.  White blood cell count now up to 62,000.  CRRT resumed 12/1: No significant change.  Ongoing cuff leak opting for early tracheostomy versus endotracheal tube change.  12/2: tolerating PS wean at 10/5, CT of the abdomen/pelvis ordered 12/6: Initiation of CRRT to remove significant  cumulative fluid balance.  Consults:  11/20 GI and surgical services 11/22 critical care  Procedures:  11/22 right IJ CVL (HD)>>> 11/22 OETT 11/22>>>12/1 Trach 12/1 (JY)>>>  Significant Diagnostic Tests:  MRCP 11/21: Acute pancreatitis with extensive pancreatic and periPancreatic inflammatory changes, including proteinaceous and/or hemorrhagic peri-pancreatic fluid.  No choledocholithiasis or biliary tract obstruction.  Small volume of ascites, trace bilateral pleural effusions.  Bilateral basilar volume loss  CT abdomen/pelvis 10/9>> persistent peripancreatic edema and fluid, slightly more organized, small area of apparent pancreatic necrosis, no intra-or extrahepatic biliary ductal dilatation, small bilateral pleural effusions with lower lobe atelectatic change  Micro Data:  Cultures negative  Antimicrobials:  Zosyn 11/20>>>11/26 Meropenem 11/26>>> 11/11/2018 Anidulafungin 12/8 >> 12/11 Meropenem 12/8 >> 12/12  Interim history/subjective:  Episodic hypotension overnight required increased in pressor support  Objective   Blood pressure (!) 101/44, pulse 80, temperature 99 F (37.2 C), temperature source Axillary, resp. rate (!) 25, height 6' (1.829 m), weight 79.3 kg, SpO2 96 %. CVP:  [2 mmHg-6 mmHg] 6 mmHg  FiO2 (%):  [28 %] 28 %   Intake/Output Summary (Last 24 hours) at 11/24/2018 0958 Last data filed at 11/24/2018 0900 Gross per 24 hour  Intake 3877.12 ml  Output 1497 ml  Net 2380.12 ml   Filed Weights   11/22/18 0500 11/23/18 0400 11/24/18 0400  Weight: 78.6 kg 78.4 kg 79.3 kg   Examination: General: Jaundiced, chronically ill appearing male HEENT: Scleral icterus, [place]/AT, PERRL, EOM-I and MMM Neuro: Awake and follows command, able to answer questions appropriately with PMV in place CV: RRR, Nl S1/S2 and -M/R/G PULM: Decrease BS diffusely GI: Soft, NT, ND and +BS Extremities: Diffuse anasarca Skin: No rashes  Resolved Hospital Problem list     Assessment  & Plan:   Acute gallstone pancreatitis development of pancreatic  necrosis no obvious abscess or cystic formation. -No evidence of choledocholelithiasis on MRCP -CT abdomen reassuring 12/9 Plan Currently following off of antimicrobials (he has had 2 discrete courses), will monitor off abx, no addition of abx regardless of clinical condition He had been tolerating postpyloric tube feeding but he had emesis overnight and the rate was decreased.  He still has gastric suctioning and secretions Hold off G-tube given conversation below  Shock.  Suspect distributive, in part related to volume removal with CVVH. Norepi at 10 mcg, no further increase regardless of pressure fluctuation CRRT with no further escalation at this point Midodrin added 12/14, continue current dose  Ileus Tolerating postpyloric tube feeding D/C G-tube  Ongoing leukocytosis in setting of pancreatic inflammation Plan Elevated but stable  Acute hypoxic respiratory failure requiring mechanical ventilation in the setting of acute aspiration event 11/29, progressive atelectasis, and decreased abdominal compliance from pain and impaired cough status post tracheostomy currently requiring ventilatory support. Plan TC as tolerated Remove vent from room No placement back on vent regardless of condition Continue work with PMV, speaking  Severe deconditioning and protein calorie malnutrition Plan Continue TF  Acute kidney injury with baseline creatinine 1.  This was hemodynamically mediated plus/minus contrast-induced nephropathy Plan CRRT per renal No further increase or changes per patient's wishes D/C tunneled catheter as we are considering comfort at this point  Hyperglycemia, then also periods of hypoglycemia Plan Sliding scale insulin per protocol  Elevated cumulative fluid balance and electrolyte imbalance: Hyperkalemia Plan Continue to follow renal panel Replete electrolytes as indicated His fluid balance is  now even for the admission  Met with patient and family in the room, discussed the case and where we are with the treatment plan.  After a long discussion, decision was made to maintain as DNR with no further escalation of care to include increase in pressors, placement on a ventilator or any other changes.  If patient is to deteriorate then we are to start morphine drip.  These were the patient's wishes and the family is supportive of that.  The patient is critically ill with multiple organ systems failure and requires high complexity decision making for assessment and support, frequent evaluation and titration of therapies, application of advanced monitoring technologies and extensive interpretation of multiple databases.   Critical Care Time devoted to patient care services described in this note is  40  Minutes. This time reflects time of care of this signee Dr Jennet Maduro. This critical care time does not reflect procedure time, or teaching time or supervisory time of PA/NP/Med student/Med Resident etc but could involve care discussion time.  Rush Farmer, M.D. [facility] Pulmonary/Critical Care Medicine. Pager: [phone]. After hours pager: [phone].

## 2018-11-24 NOTE — Progress Notes (Signed)
Patient grimacing and complaint of abdominal pain 10/10 despite Dilaudid zofran and warm compresses to abdomen. Tube feeding stopped , Dr Nelda Marseille notified orders for increased frequency of Dilaudid

## 2018-11-24 NOTE — Progress Notes (Signed)
I called and updated Derek Riley Brother of his worsening condition increased abdominal pain and decreased blood pressure and increased work of breathing . He states that he understands from family meeting earlier today with Dr Nelda Marseille that we will not treat the decreased blood pressure and that his brother could pass as a result .

## 2018-11-24 NOTE — Progress Notes (Signed)
Patient's heart rate increased to 180 with SBP 38 while cleaning pt for Bowel movement CRRT stopped per hold order after calling Dr Moshe Cipro . Dr Nelda Marseille called with update Patient remains alert heart now down to 80 and SBP  79.

## 2018-11-25 LAB — RENAL FUNCTION PANEL
ALBUMIN: 1.4 g/dL — AB (ref 3.5–5.0)
Anion gap: 14 (ref 5–15)
BUN: 46 mg/dL — ABNORMAL HIGH (ref 8–23)
CO2: 20 mmol/L — ABNORMAL LOW (ref 22–32)
Calcium: 7.3 mg/dL — ABNORMAL LOW (ref 8.9–10.3)
Chloride: 104 mmol/L (ref 98–111)
Creatinine, Ser: 3.27 mg/dL — ABNORMAL HIGH (ref 0.61–1.24)
GFR calc Af Amer: 21 mL/min — ABNORMAL LOW (ref >60)
GFR calc non Af Amer: 19 mL/min — ABNORMAL LOW (ref >60)
Glucose, Bld: 165 mg/dL — ABNORMAL HIGH (ref 70–99)
PHOSPHORUS: 4.5 mg/dL (ref 2.5–4.6)
POTASSIUM: 5.3 mmol/L — AB (ref 3.5–5.1)
Sodium: 138 mmol/L (ref 135–145)

## 2018-11-25 LAB — CBC
HCT: 23 % — ABNORMAL LOW (ref 39.0–52.0)
Hemoglobin: 7.3 g/dL — ABNORMAL LOW (ref 13.0–17.0)
MCH: 29.2 pg (ref 26.0–34.0)
MCHC: 31.7 g/dL (ref 30.0–36.0)
MCV: 92 fL (ref 80.0–100.0)
NRBC: 0.2 % (ref 0.0–0.2)
Platelets: 131 10*3/uL — ABNORMAL LOW (ref 150–400)
RBC: 2.5 MIL/uL — ABNORMAL LOW (ref 4.22–5.81)
RDW: 23.4 % — ABNORMAL HIGH (ref 11.5–15.5)
WBC: 50.6 10*3/uL (ref 4.0–10.5)

## 2018-11-25 LAB — BASIC METABOLIC PANEL
Anion gap: 12 (ref 5–15)
BUN: 32 mg/dL — ABNORMAL HIGH (ref 8–23)
CO2: 22 mmol/L (ref 22–32)
Calcium: 7.1 mg/dL — ABNORMAL LOW (ref 8.9–10.3)
Chloride: 103 mmol/L (ref 98–111)
Creatinine, Ser: 2.39 mg/dL — ABNORMAL HIGH (ref 0.61–1.24)
GFR calc Af Amer: 31 mL/min — ABNORMAL LOW (ref >60)
GFR calc non Af Amer: 27 mL/min — ABNORMAL LOW (ref >60)
Glucose, Bld: 146 mg/dL — ABNORMAL HIGH (ref 70–99)
Potassium: 4.8 mmol/L (ref 3.5–5.1)
Sodium: 137 mmol/L (ref 135–145)

## 2018-11-25 LAB — GLUCOSE, CAPILLARY
Glucose-Capillary: 129 mg/dL — ABNORMAL HIGH (ref 70–99)
Glucose-Capillary: 139 mg/dL — ABNORMAL HIGH (ref 70–99)
Glucose-Capillary: 149 mg/dL — ABNORMAL HIGH (ref 70–99)
Glucose-Capillary: 161 mg/dL — ABNORMAL HIGH (ref 70–99)
Glucose-Capillary: 159 mg/dL — ABNORMAL HIGH (ref 70–99)
Glucose-Capillary: 140 mg/dL — ABNORMAL HIGH (ref 70–99)

## 2018-11-25 LAB — PHOSPHORUS: Phosphorus: 3.9 mg/dL (ref 2.5–4.6)

## 2018-11-25 LAB — MAGNESIUM: Magnesium: 2.7 mg/dL — ABNORMAL HIGH (ref 1.7–2.4)

## 2018-11-25 MED ORDER — MIDODRINE HCL 5 MG PO TABS
10.0000 mg | ORAL_TABLET | Freq: Three times a day (TID) | ORAL | Status: DC
  Administered 2018-11-26: 10 mg via ORAL
  Filled 2018-11-25 (×2): qty 2

## 2018-11-25 MED ORDER — MORPHINE 100MG IN NS 100ML (1MG/ML) PREMIX INFUSION
0.5000 mg/h | INTRAVENOUS | Status: DC
  Administered 2018-11-25: 1 mg/h via INTRAVENOUS
  Filled 2018-11-25: qty 100

## 2018-11-25 NOTE — Progress Notes (Addendum)
Morphine infusion started per Dr Pura Spice orders for comfort . Family is at bedside . Tube feedings not restated per Dr Pura Spice verbal orders orders.

## 2018-11-25 NOTE — Progress Notes (Signed)
NAME:  Derek Riley, MRN:  [id number], DOB:  09/29/1951, LOS: 46 ADMISSION DATE:  10/31/2018, CONSULTATION DATE:  11/22 REFERRING MD:  11/22, CHIEF COMPLAINT:  Worsening metabolic acidosis and renal failure w/ clinical decline  Brief History   67 year old male admitted 11/20 w/ gallstone pancreatitis. PCCM consulted 11/22 for progressive metabolic acidosis, acute renal failure and persistent clinical decline.   Past Medical History  Diabetes, gallstones, hypertension.  Significant Hospital Events   11/20 transferred to [facility] from [place] w/ acute gallstone pancreatitis. GI & surgicals services consulted. Started On supportive IVFs, empiric zosyn and analgesic support.  Surgical service is planning on cholecystectomy once pancreatitis improved.  Initial lipase on arrival to [place], creatinine 1.72 11/21: MRCP 11/21 showing extensive pancreatitis, possible hemorrhagic peripancreatic fluid no choledocholithiasis, lipase 3361, creatinine up to 4.91, anion gap 13.  No role for ERCP continued supportive care 11/22: Creatinine up to 6.95, gap now 16, progressive diffuse mottling, worse pain, critical care consulted for shock, and progressive multiorgan failure, moving to intensive care for CRRT as well as hemodynamic support. 3.7 liters + since admit, developed worsening resp failure requiring intubation shortly after arrival. Possible aspiration during intubation  11/26 started on meropenem (d/t rising CBC) CRRT stopped 11/27 extubated, pressors off.  11/28 look weaker. WIll probably need re-intubation. 11/29 reintubated for increasing WOB, likely aspiration even overnight.  11/30: Remains intubated.  White blood cell count now up to 62,000.  CRRT resumed 12/1: No significant change.  Ongoing cuff leak opting for early tracheostomy versus endotracheal tube change.  12/2: tolerating PS wean at 10/5, CT of the abdomen/pelvis ordered 12/6: Initiation of CRRT to remove significant  cumulative fluid balance.  Consults:  11/20 GI and surgical services 11/22 critical care  Procedures:  11/22 right IJ CVL (HD)>>> 11/22 OETT 11/22>>>12/1 Trach 12/1 (JY)>>>  Significant Diagnostic Tests:  MRCP 11/21: Acute pancreatitis with extensive pancreatic and periPancreatic inflammatory changes, including proteinaceous and/or hemorrhagic peri-pancreatic fluid.  No choledocholithiasis or biliary tract obstruction.  Small volume of ascites, trace bilateral pleural effusions.  Bilateral basilar volume loss  CT abdomen/pelvis 10/9>> persistent peripancreatic edema and fluid, slightly more organized, small area of apparent pancreatic necrosis, no intra-or extrahepatic biliary ductal dilatation, small bilateral pleural effusions with lower lobe atelectatic change  Micro Data:  Cultures negative  Antimicrobials:  Zosyn 11/20>>>11/26 Meropenem 11/26>>> 11/11/2018 Anidulafungin 12/8 >> 12/11 Meropenem 12/8 >> 12/12  Interim history/subjective:  C/o pain this AM, no further events overnight  Objective   Blood pressure (!) 102/47, pulse 82, temperature 98.7 F (37.1 C), temperature source Oral, resp. rate (!) 25, height 6' (1.829 m), weight 79.9 kg, SpO2 (!) 88 %. CVP:  [4 mmHg] 4 mmHg  FiO2 (%):  [28 %-35 %] 35 %   Intake/Output Summary (Last 24 hours) at 11/25/2018 1032 Last data filed at 11/25/2018 1000 Gross per 24 hour  Intake 1571.82 ml  Output 697 ml  Net 874.82 ml   Filed Weights   11/23/18 0400 11/24/18 0400 11/25/18 0300  Weight: 78.4 kg 79.3 kg 79.9 kg   Examination: General: Jaundiced, in pain appearing HEENT: [place]/AT, PERRL, EOM-I and icteric and MMM Neuro: Awake and interactive, moving all ext to command, in painful distress CV: RRR, Nl S1/S2 and -M/R/G PULM: Decreased BS diffusely GI: Soft, NT, ND and +BS Extremities: Diffuse anasarca Skin: No rashes  Resolved Hospital Problem list     Assessment & Plan:   Acute gallstone pancreatitis development of  pancreatic necrosis no  obvious abscess or cystic formation. -No evidence of choledocholelithiasis on MRCP -CT abdomen reassuring 12/9 Plan Morphine for pain  Shock.  Suspect distributive, in part related to volume removal with CVVH. D/C CRRT  Ileus D/C TF Zofran Reglan  Ongoing leukocytosis in setting of pancreatic inflammation Plan D/C further blood draws at this poin  Acute hypoxic respiratory failure requiring mechanical ventilation in the setting of acute aspiration event 11/29, progressive atelectasis, and decreased abdominal compliance from pain and impaired cough status post tracheostomy currently requiring ventilatory support. Plan No vent TC  Severe deconditioning and protein calorie malnutrition Plan D/C TF  Acute kidney injury with baseline creatinine 1.  This was hemodynamically mediated plus/minus contrast-induced nephropathy Plan D/C CRRT D/C blood draws  Hyperglycemia, then also periods of hypoglycemia Plan D/C CBGs D/C ISS  Elevated cumulative fluid balance and electrolyte imbalance: Hyperkalemia Plan D/C further blood draws  Spoke with patient's family at length today, patient is uncomfortable, will start morphine drip.  The patient is critically ill with multiple organ systems failure and requires high complexity decision making for assessment and support, frequent evaluation and titration of therapies, application of advanced monitoring technologies and extensive interpretation of multiple databases.   Critical Care Time devoted to patient care services described in this note is  36  Minutes. This time reflects time of care of this signee Dr Jennet Maduro. This critical care time does not reflect procedure time, or teaching time or supervisory time of PA/NP/Med student/Med Resident etc but could involve care discussion time.  Rush Farmer, M.D. [facility] Pulmonary/Critical Care Medicine. Pager: [phone]. After hours pager: [phone].

## 2018-11-25 NOTE — Progress Notes (Signed)
Nutrition Brief Note  Chart reviewed. TF has been discontinued due to ileus.  CRRT stopped with decline in clinical status and low BP. Plans for no escalation of care. Labs and blood draws d/c'ed. Morphine drip initiated. No further nutrition interventions warranted at this time.  Please re-consult as needed.   Molli Barrows, RD, LDN, [place] Pager [phone] After Hours Pager [phone]

## 2018-11-25 NOTE — Care Management Note (Signed)
Case Management Note  Patient Details  Name: Derek Riley MRN: [id number] Date of Birth: April 07, 1951  Subjective/Objective:   Pt admitted with necrotizing pancreatitis                 Action/Plan:    PTA from home.   Pt remains ventilated via ETT tube on IV sedation.  Pt requiring ongoing HD however CRRT paused for line holiday.    Expected Discharge Date:                  Expected Discharge Plan:  Long Term Acute Care (LTAC)  In-House Referral:  Clinical Social Work  Discharge planning Services  CM Consult  Post Acute Care Choice:    Choice offered to:     DME Arranged:    DME Agency:     HH Arranged:    [place] Agency:     Status of Service:     If discussed at [employer], dates discussed:    Additional Comments: 11/25/2018  Pt has now transitioned to comfort care on morphine drip.  11/23/18 Pt is s/p trach - able to go on trach collar over the weekend.  Pt still requiring CRRT - will transition to IHD once BP can tolerate, still on pressors. Still  has NG tube for tube feeds - barriers with abdominal tube placement due to current illness.   [place] discussions ongoing  11/13/18 If pt requires trach and able to stay of CRRT - pt may benefit from [facility] referral.  CM will continue to follow [name], RN 11/25/2018, 4:07 PM

## 2018-11-25 NOTE — Progress Notes (Signed)
Subjective:  Was told yesterday that BP was in the 30's and 40's- decision was made to not escalate care - CRRT was stopped due to low BP.  However, it then seemed that BP was not that low- has survived overnight- BP reading 60's to 90's and pt is alert   Objective Vital signs in last 24 hours: Vitals:   11/25/18 0500 11/25/18 0530 11/25/18 0600 11/25/18 0630  BP: (!) 96/44 (!) 80/46 (!) 69/44 (!) 93/46  Pulse: 85 89 79 82  Resp: (!) 23 (!) 34 (!) 26 (!) 29  Temp:      TempSrc:      SpO2: 91% (!) 89% 91% 93%  Weight:      Height:       Weight change: 0.6 kg  Intake/Output Summary (Last 24 hours) at 11/25/2018 2353 Last data filed at 11/25/2018 0600 Gross per 24 hour  Intake 1948.81 ml  Output 1154 ml  Net 794.81 ml    Assessment/ Plan: Pt is a 67 y.o. yo male - baseline crt of 1 who was admitted on 10/23/2018 with gallstone pancreatitis- has developed AKI as complication  Assessment/Plan: 1. Renal- AKI and been RRT dependent in some form since November 22 or so.   CRRT stopped yesterday due to hemodynamic instability via vascath placed 8/55- now 80 days old.   No UOP recorded - crt did rise off of CRRT.  There are not acute indications for reinitiating RRT right now but if continues to survive with no UOP will need to decide if we are to re initiate and what line to do so with  2. Pancreatitis- supportive care per CCM.  Has trach.  WBC elevated - leukemoid reaction/steriods 3. Anemia- hgb in the 7's- transfuse PRN- have also added ESA  4. Elytes- K and phos OK for today.  Corrected calcium is OK  5. HTN/volume- CVP previously low- has edema but may be third spacing- will continue to hold CRRT  [place]: Basic Metabolic Panel: Recent Labs  Lab 11/24/18 0343 11/24/18 1538 11/25/18 0315  NA 137 138 137  K 4.0 4.4 4.8  CL 103 105 103  CO2 23 20* 22  GLUCOSE 141* 145* 146*  BUN 21 19 32*  CREATININE 1.38* 1.46* 2.39*  CALCIUM 7.3* 7.4* 7.1*  PHOS  2.3* 3.0 3.9   Liver Function Tests: Recent Labs  Lab 11/23/18 1517 11/24/18 0343 11/24/18 1538  ALBUMIN 1.3* 1.6* 1.5*   No results for input(s): LIPASE, AMYLASE in the last 168 hours. No results for input(s): AMMONIA in the last 168 hours. CBC: Recent Labs  Lab 11/20/18 0314 11/21/18 0332 11/22/18 0440 11/23/18 0510 11/25/18 0315  WBC 29.3* 32.4* 34.4* 44.4* 50.6*  HGB 7.6* 7.6* 7.0* 7.2* 7.3*  HCT 24.6* 23.6* 22.2* 23.2* 23.0*  MCV 92.5 91.8 91.7 91.3 92.0  PLT 197 159 144* 171 131*   Cardiac Enzymes: No results for input(s): CKTOTAL, CKMB, CKMBINDEX, TROPONINI in the last 168 hours. CBG: Recent Labs  Lab 11/24/18 0803 11/24/18 1219 11/24/18 1926 11/24/18 2329 11/25/18 0309  GLUCAP 138* 104* 136* 143* 129*    Iron Studies: No results for input(s): IRON, TIBC, TRANSFERRIN, FERRITIN in the last 72 hours. Studies/Results: No results found. Medications: Infusions: .  prismasol BGK 4/2.5 Stopped (11/24/18 1900)  .  prismasol BGK 4/2.5 300 mL/hr at 11/24/18 0100  . sodium chloride    . sodium chloride 10 mL/hr at 11/25/18 0600  . heparin 10,000 units/ 20  mL infusion syringe Stopped (11/24/18 1900)  . heparin 999 mL/hr at 11/20/18 0645  . norepinephrine (LEVOPHED) Adult infusion 12 mcg/min (11/25/18 0600)  . prismasol BGK 4/2.5 Stopped (11/24/18 1900)    Scheduled Medications: . chlorhexidine  15 mL Mouth Rinse BID  . Chlorhexidine Gluconate Cloth  6 each Topical Daily  . darbepoetin (ARANESP) injection - NON-DIALYSIS  200 mcg Subcutaneous Q Mon-1800  . feeding supplement (PRO-STAT SUGAR FREE 64)  30 mL Per Tube BID  . feeding supplement (VITAL AF 1.2 CAL)  1,000 mL Per Tube Q24H  . folic acid  1 mg Oral Daily  . HYDROmorphone  1 mg Per Tube Q12H  . insulin aspart  0-9 Units Subcutaneous Q4H  . mouth rinse  15 mL Mouth Rinse q12n4p  . midodrine  10 mg Oral TID WC  . multivitamin  15 mL Oral Daily  . pantoprazole sodium  40 mg Per Tube Daily  . sodium  chloride flush  10-40 mL Intracatheter Q12H  . thiamine injection  100 mg Intravenous Daily    have reviewed scheduled and prn medications.  Physical Exam: General: arousable, seems to understand Heart: RRR Lungs: CBS bilat Abdomen: distended Extremities: some pitting edema Dialysis Access: femoral vascath placed 12/6    11/25/2018,6:35 AM  LOS: 28 days        Subjective:  Remains on pressors overnight, some hypotension- CRRT running well - ended up positive 2 liters- CVP 6 this AM  Objective Vital signs in last 24 hours: Vitals:   11/25/18 0500 11/25/18 0530 11/25/18 0600 11/25/18 0630  BP: (!) 96/44 (!) 80/46 (!) 69/44 (!) 93/46  Pulse: 85 89 79 82  Resp: (!) 23 (!) 34 (!) 26 (!) 29  Temp:      TempSrc:      SpO2: 91% (!) 89% 91% 93%  Weight:      Height:       Weight change: 0.6 kg  Intake/Output Summary (Last 24 hours) at 11/25/2018 0258 Last data filed at 11/25/2018 0600 Gross per 24 hour  Intake 1948.81 ml  Output 1154 ml  Net 794.81 ml    Assessment/ Plan: Pt is a 67 y.o. yo male - baseline crt of 1 who was admitted on 10/24/2018 with gallstone pancreatitis- has developed AKI as complication  Assessment/Plan: 1. Renal- AKI and been RRT dependent in some form since November 22 or so.  Currently on CRRT via vascath placed 52/89- now 40 days old.  Will need new line- I can ask IR to place a tunneled cath, they may be concerned regarding elevated WBC but has been covered.  No UOP recorded so feel like will need to continue with RRT 2. Pancreatitis- supportive care per CCM.  Has trach.  WBC elevated - leukemoid reaction/steriods 3. Anemia- hgb in the 7's- transfuse PRN- have also added ESA  4. Elytes- K and phos OK for today.  Corrected calcium is OK  5. HTN/volume- CVP low- has edema but may be third spacing- will continue to give a little fluid back and make sure running even /positive  Derek Riley    Labs: Basic Metabolic Panel: Recent Labs   Lab 11/24/18 0343 11/24/18 1538 11/25/18 0315  NA 137 138 137  K 4.0 4.4 4.8  CL 103 105 103  CO2 23 20* 22  GLUCOSE 141* 145* 146*  BUN 21 19 32*  CREATININE 1.38* 1.46* 2.39*  CALCIUM 7.3* 7.4* 7.1*  PHOS 2.3* 3.0 3.9   Liver Function Tests:  Recent Labs  Lab 11/23/18 1517 11/24/18 0343 11/24/18 1538  ALBUMIN 1.3* 1.6* 1.5*   No results for input(s): LIPASE, AMYLASE in the last 168 hours. No results for input(s): AMMONIA in the last 168 hours. CBC: Recent Labs  Lab 11/20/18 0314 11/21/18 0332 11/22/18 0440 11/23/18 0510 11/25/18 0315  WBC 29.3* 32.4* 34.4* 44.4* 50.6*  HGB 7.6* 7.6* 7.0* 7.2* 7.3*  HCT 24.6* 23.6* 22.2* 23.2* 23.0*  MCV 92.5 91.8 91.7 91.3 92.0  PLT 197 159 144* 171 131*   Cardiac Enzymes: No results for input(s): CKTOTAL, CKMB, CKMBINDEX, TROPONINI in the last 168 hours. CBG: Recent Labs  Lab 11/24/18 0803 11/24/18 1219 11/24/18 1926 11/24/18 2329 11/25/18 0309  GLUCAP 138* 104* 136* 143* 129*    Iron Studies: No results for input(s): IRON, TIBC, TRANSFERRIN, FERRITIN in the last 72 hours. Studies/Results: No results found. Medications: Infusions: .  prismasol BGK 4/2.5 Stopped (11/24/18 1900)  .  prismasol BGK 4/2.5 300 mL/hr at 11/24/18 0100  . sodium chloride    . sodium chloride 10 mL/hr at 11/25/18 0600  . heparin 10,000 units/ 20 mL infusion syringe Stopped (11/24/18 1900)  . heparin 999 mL/hr at 11/20/18 0645  . norepinephrine (LEVOPHED) Adult infusion 12 mcg/min (11/25/18 0600)  . prismasol BGK 4/2.5 Stopped (11/24/18 1900)    Scheduled Medications: . chlorhexidine  15 mL Mouth Rinse BID  . Chlorhexidine Gluconate Cloth  6 each Topical Daily  . darbepoetin (ARANESP) injection - NON-DIALYSIS  200 mcg Subcutaneous Q Mon-1800  . feeding supplement (PRO-STAT SUGAR FREE 64)  30 mL Per Tube BID  . feeding supplement (VITAL AF 1.2 CAL)  1,000 mL Per Tube Q24H  . folic acid  1 mg Oral Daily  . HYDROmorphone  1 mg Per Tube  Q12H  . insulin aspart  0-9 Units Subcutaneous Q4H  . mouth rinse  15 mL Mouth Rinse q12n4p  . midodrine  10 mg Oral TID WC  . multivitamin  15 mL Oral Daily  . pantoprazole sodium  40 mg Per Tube Daily  . sodium chloride flush  10-40 mL Intracatheter Q12H  . thiamine injection  100 mg Intravenous Daily    have reviewed scheduled and prn medications.  Physical Exam: General: restless, appears to be in pain but is denying Heart: RRR Lungs: CBS bilat Abdomen: distended Extremities: some pitting edema Dialysis Access: femoral vascath placed 12/6    11/25/2018,6:35 AM  LOS: 28 days

## 2018-11-25 NOTE — Consult Note (Addendum)
[facility] Nurse wound follow up Wound type: MDRPI forehead stage 2. Pt has previously charted DTI that has evolved into unstageable.. Measurement:Right  Forehead has 0.4cm round x 0.1cm pink wound bed. Left forehead has two wounds, each measuring 0.4cm round x 0.1cm stage 2 with pink wound beds. Previously noted DTI on sacrum has evolved into unstageable. 2cm x 0.8cm x 0.1cm with yellow exudate covering wound bed. Wound bed:see above Drainage (amount, consistency, odor) none Periwound:intact Dressing procedure/placement/frequency:Wounds continue to heal now that oxygen sensor not in use. Begin NS moist to dry dressings BID under foam on sacrum.  WOC will continue to assess weekly. If assistance is needed prior to next visit, please re-consult. Fara Olden, RN-C, WTA-C, [place] Wound Treatment Associate Ostomy Care Associate

## 2018-11-25 NOTE — Progress Notes (Signed)
  Speech Language Pathology Treatment: Nada Boozer Speaking valve  Patient Details Name: Derek Riley MRN: [id number] DOB: 1951/11/06 Today's Date: 11/25/2018 Time: 6378-5885 SLP Time Calculation (min) (ACUTE ONLY): 8 min  Assessment / Plan / Recommendation Clinical Impression  Pt is alert but not making eye contact at the moment, getting morphine per RN. PMV trial was kept brief as he was not attending to communication tasks well. He needed Mod-Max cues to initiate phonation today, but it was strong and clear when produced. Otherwise he was primarily mouthing words. SLP will continue to follow - would still only use PMV for brief intervals when full supervision can be provided.   HPI HPI: 67 y.o. male admitted 11/20 with abdominal pain, found to have severe gallstone pancreatitis. Intubated 11/21-11/27, re-intubated 11/30 with trach 12/1 remains intubated and on CRRT at time of PT evaluation 11/18/18. PMH includes: HTN, HLD, DM      SLP Plan  Continue with current plan of care       Recommendations         Patient may use Passy-Muir Speech Valve: Intermittently with supervision PMSV Supervision: Full         Follow up Recommendations: Skilled Nursing facility SLP Visit Diagnosis: Aphonia (R49.1) Plan: Continue with current plan of care       GO                Germain Osgood 11/25/2018, 12:11 PM  Germain Osgood, M.A. [facility] Acute Environmental education officer [phone] Office [phone]

## 2018-11-25 NOTE — Progress Notes (Signed)
PT Cancellation Note  Patient Details Name: Derek Riley MRN: [id number] DOB: Sep 16, 1951   Cancelled Treatment:     patient with MAP of 51, PT will hold and cont to follow.   Reinaldo Berber, PT, DPT Acute Rehabilitation Services Pager: [phone] Office: [phone]     Reinaldo Berber 11/25/2018, 11:34 AM

## 2018-11-25 NOTE — Progress Notes (Signed)
sp02 dropped to 87%, suctioned pt but not improvement.  Increased Fio2 from 28% to 35% ATC.

## 2018-11-26 LAB — GLUCOSE, CAPILLARY
GLUCOSE-CAPILLARY: 125 mg/dL — AB (ref 70–99)
Glucose-Capillary: 128 mg/dL — ABNORMAL HIGH (ref 70–99)

## 2018-11-26 LAB — RENAL FUNCTION PANEL
Albumin: 1.3 g/dL — ABNORMAL LOW (ref 3.5–5.0)
Anion gap: 16 — ABNORMAL HIGH (ref 5–15)
BUN: 61 mg/dL — ABNORMAL HIGH (ref 8–23)
CO2: 20 mmol/L — ABNORMAL LOW (ref 22–32)
Calcium: 7.1 mg/dL — ABNORMAL LOW (ref 8.9–10.3)
Chloride: 101 mmol/L (ref 98–111)
Creatinine, Ser: 4.04 mg/dL — ABNORMAL HIGH (ref 0.61–1.24)
GFR calc Af Amer: 17 mL/min — ABNORMAL LOW (ref >60)
GFR calc non Af Amer: 14 mL/min — ABNORMAL LOW (ref >60)
Glucose, Bld: 138 mg/dL — ABNORMAL HIGH (ref 70–99)
Phosphorus: 6.1 mg/dL — ABNORMAL HIGH (ref 2.5–4.6)
Potassium: 5.4 mmol/L — ABNORMAL HIGH (ref 3.5–5.1)
Sodium: 137 mmol/L (ref 135–145)

## 2018-11-26 LAB — MAGNESIUM: Magnesium: 2.9 mg/dL — ABNORMAL HIGH (ref 1.7–2.4)

## 2018-11-26 MED ORDER — GLYCOPYRROLATE 1 MG PO TABS: 1.0000 mg | ORAL_TABLET | ORAL | Status: DC | PRN

## 2018-11-26 MED ORDER — ACETAMINOPHEN 325 MG PO TABS: 650.0000 mg | ORAL_TABLET | Freq: Four times a day (QID) | ORAL | Status: DC | PRN

## 2018-11-26 MED ORDER — DIPHENHYDRAMINE HCL 50 MG/ML IJ SOLN: 25.0000 mg | INTRAMUSCULAR | Status: DC | PRN

## 2018-11-26 MED ORDER — ACETAMINOPHEN 650 MG RE SUPP: 650.0000 mg | Freq: Four times a day (QID) | RECTAL | Status: DC | PRN

## 2018-11-26 MED ORDER — HYDROMORPHONE HCL 1 MG/ML IJ SOLN
INTRAMUSCULAR | Status: AC
  Administered 2018-11-26: 1 mg via INTRAVENOUS
  Filled 2018-11-26: qty 1

## 2018-11-26 MED ORDER — MORPHINE SULFATE (PF) 2 MG/ML IV SOLN: 2.0000 mg | INTRAVENOUS | Status: DC | PRN

## 2018-11-26 MED ORDER — DEXTROSE 5 % IV SOLN
INTRAVENOUS | Status: DC
  Administered 2018-11-26: 13:00:00 via INTRAVENOUS

## 2018-11-26 MED ORDER — POLYVINYL ALCOHOL 1.4 % OP SOLN
1.0000 [drp] | Freq: Four times a day (QID) | OPHTHALMIC | Status: DC | PRN
  Filled 2018-11-26: qty 15

## 2018-11-26 MED ORDER — MORPHINE SULFATE (PF) 2 MG/ML IV SOLN
2.0000 mg | INTRAVENOUS | Status: DC | PRN
  Administered 2018-11-26 (×2): 2 mg via INTRAVENOUS

## 2018-11-26 MED ORDER — MORPHINE 100MG IN NS 100ML (1MG/ML) PREMIX INFUSION
0.0000 mg/h | INTRAVENOUS | Status: DC
  Administered 2018-11-26: 10 mg/h via INTRAVENOUS
  Administered 2018-11-26: 15 mg/h via INTRAVENOUS
  Filled 2018-11-26: qty 100

## 2018-11-26 MED ORDER — GLYCOPYRROLATE 0.2 MG/ML IJ SOLN: 0.2000 mg | INTRAMUSCULAR | Status: DC | PRN

## 2018-11-26 MED ORDER — DEXTROSE 5 % IV SOLN: INTRAVENOUS | Status: DC

## 2018-11-26 MED ORDER — GLYCOPYRROLATE 0.2 MG/ML IJ SOLN
0.2000 mg | INTRAMUSCULAR | Status: DC | PRN
  Administered 2018-11-26: 0.2 mg via INTRAVENOUS
  Filled 2018-11-26: qty 1

## 2018-11-26 MED ORDER — HYDROMORPHONE HCL 1 MG/ML IJ SOLN
1.0000 mg | Freq: Once | INTRAMUSCULAR | Status: AC
  Administered 2018-11-26: 1 mg via INTRAVENOUS

## 2018-11-26 MED ORDER — SODIUM CHLORIDE 0.9 % IV SOLN
1.0000 mg/h | INTRAVENOUS | Status: DC
  Filled 2018-11-26: qty 10

## 2018-11-26 MED ORDER — MORPHINE BOLUS VIA INFUSION
5.0000 mg | INTRAVENOUS | Status: DC | PRN
  Administered 2018-11-26: 5 mg via INTRAVENOUS
  Filled 2018-11-26: qty 5

## 2018-11-27 ENCOUNTER — Telehealth: Payer: Self-pay | Admitting: *Deleted

## 2018-11-27 NOTE — Telephone Encounter (Signed)
Received D/C from [facility]-D/C Forwarded to Dr.Mannam to sign

## 2018-11-30 ENCOUNTER — Telehealth: Payer: Self-pay | Admitting: Pulmonary Disease

## 2018-11-30 NOTE — Telephone Encounter (Incomplete)
11/30/18 Luann from [facility] called to see if we had gotten D/C signed yet I faxed her a copy of signed faxed D/C waiting on Mannam to sign the original. Faxed copy to [phone]. PWR 12/01/18 I received D/C back from Dr.Mannam faxed it to [place] [phone]. PWR

## 2018-11-30 NOTE — Telephone Encounter (Signed)
11/30/18 received orignal D/C and the faxed copy back from Dr.Mannam. I sent the original back to him to sign at.

## 2018-12-09 NOTE — Progress Notes (Signed)
Pt's family requested to speak to primary RN concerning pt's obvious discomfort as he was clearly grimacing and raising his arms up. Family decided to transition pt to comfort care. Dr. Vaughan Browner informed of decision. Awaiting orders. Will continue to closely monitor pt.

## 2018-12-09 NOTE — Progress Notes (Signed)
PT Cancellation Note  Patient Details Name: Derek Riley MRN: [id number] DOB: 13-Aug-1951   Cancelled Treatment:     Per Chart patient has transitioned to comfort care. PT will sign off at this time, please re-order if appropriate.   Reinaldo Berber, PT, DPT Acute Rehabilitation Services Pager: [phone] Office: [place] December 08, 2018, 8:12 AM

## 2018-12-09 NOTE — Discharge Summary (Signed)
Physician Death Summary  Patient ID: Derek Riley MRN: [id number] DOB/AGE: [birth year] 68 y.o.  Admit date: 11/01/2018 Discharge date: November 29, 2018 Admission Diagnoses:  Gallstone pancreatitis  Discharge Diagnoses:  Respiratory failure   Acute gallstone pancreatitis   HTN (hypertension)   AKI (acute kidney injury) ([place])   Hyperglycemia   Hyperlipidemia   Aspiration pneumonia (HCC)   Acute respiratory failure with hypoxia (HCC)   Status post tracheostomy ([place])   Aspiration of foreign body   Ileus (HCC)   Pressure injury of skin   Idiopathic acute pancreatitis   Endotracheal tube present   Status post insertion of hemodialysis catheter ([place])   Bradycardia   Pericarditis  Discharged Condition: Deceased  Hospital Course:  68 year old male admitted 11/20 w/ gallstone pancreatitis.  11/20 transferred to [facility] from [place] w/ acute gallstone pancreatitis. GI & surgicals services consulted. Started On supportive IVFs, empiric zosyn and analgesic support.  Surgical service is planning on cholecystectomy once pancreatitis improved.  Initial lipase on arrival to [place], creatinine 1.72 11/21: MRCP 11/21 showing extensive pancreatitis, possible hemorrhagic peripancreatic fluid no choledocholithiasis, lipase 3361, creatinine up to 4.91, anion gap 13.  No role for ERCP continued supportive care 11/22: Creatinine up to 6.95, gap now 16, progressive diffuse mottling, worse pain, critical care consulted for shock, and progressive multiorgan failure, moving to intensive care for CRRT as well as hemodynamic support. 3.7 liters + since admit, developed worsening resp failure requiring intubation shortly after arrival. Possible aspiration during intubation  11/26 started on meropenem (d/t rising CBC) CRRT stopped 11/27 extubated, pressors off.  11/28 look weaker. WIll probably need re-intubation. 11/29 reintubated for increasing WOB, likely aspiration even overnight.  11/30: Remains  intubated.  White blood cell count now up to 62,000.  CRRT resumed 12/1: Underwent tracheostomy. 12/2: tolerating PS wean at 10/5, CT of the abdomen/pelvis ordered 12/6: Initiation of CRRT to remove significant cumulative fluid balance. 12/19: transition to comfort care  Had a family meeting on 12/19.  They feel that the patient has been through a lot.  They are distressed by the fact that he is in significant pain which is not well controlled.  They requested transition to full comfort measures and titration up of pain medication to keep him comfortable. Passed away same day at 4:42 PM.  Consults:  11/20 GI and surgical services 11/21 nephrology 11/22 critical care 12/4 ID 12/5 cardiology  Significant Diagnostic Studies: MRCP 11/21: Acute pancreatitis with extensive pancreatic and periPancreatic inflammatory changes, including proteinaceous and/or hemorrhagic peri-pancreatic fluid.  No choledocholithiasis or biliary tract obstruction.  Small volume of ascites, trace bilateral pleural effusions.  Bilateral basilar volume loss  CT abdomen/pelvis 10/9>> persistent peripancreatic edema and fluid, slightly more organized, small area of apparent pancreatic necrosis, no intra-or extrahepatic biliary ductal dilatation, small bilateral pleural effusions with lower lobe atelectatic change  Signed: Caspian Deleonardis 11/27/2018, 4:30 PM

## 2018-12-09 NOTE — Progress Notes (Signed)
NAME:  Derek Riley, MRN:  [id number], DOB:  06-19-1951, LOS: 24 ADMISSION DATE:  11/01/2018, CONSULTATION DATE:  11/22 REFERRING MD:  11/22, CHIEF COMPLAINT:  Worsening metabolic acidosis and renal failure w/ clinical decline  Brief History   68 year old male admitted 11/20 w/ gallstone pancreatitis. PCCM consulted 11/22 for progressive metabolic acidosis, acute renal failure and persistent clinical decline.   Past Medical History  Diabetes, gallstones, hypertension.  Significant Hospital Events   11/20 transferred to [facility] from [place] w/ acute gallstone pancreatitis. GI & surgicals services consulted. Started On supportive IVFs, empiric zosyn and analgesic support.  Surgical service is planning on cholecystectomy once pancreatitis improved.  Initial lipase on arrival to [place], creatinine 1.72 11/21: MRCP 11/21 showing extensive pancreatitis, possible hemorrhagic peripancreatic fluid no choledocholithiasis, lipase 3361, creatinine up to 4.91, anion gap 13.  No role for ERCP continued supportive care 11/22: Creatinine up to 6.95, gap now 16, progressive diffuse mottling, worse pain, critical care consulted for shock, and progressive multiorgan failure, moving to intensive care for CRRT as well as hemodynamic support. 3.7 liters + since admit, developed worsening resp failure requiring intubation shortly after arrival. Possible aspiration during intubation  11/26 started on meropenem (d/t rising CBC) CRRT stopped 11/27 extubated, pressors off.  11/28 look weaker. WIll probably need re-intubation. 11/29 reintubated for increasing WOB, likely aspiration even overnight.  11/30: Remains intubated.  White blood cell count now up to 62,000.  CRRT resumed 12/1: No significant change.  Ongoing cuff leak opting for early tracheostomy versus endotracheal tube change.  12/2: tolerating PS wean at 10/5, CT of the abdomen/pelvis ordered 12/6: Initiation of CRRT to remove significant  cumulative fluid balance. 12/19: transition to comfort care  Consults:  11/20 GI and surgical services 11/21 nephrology 11/22 critical care 12/4 ID 12/5 cardiology  Procedures:  11/22 right IJ CVL (HD)>>> 11/22 OETT 11/22>>>12/1 Trach 12/1 (JY)>>>  Significant Diagnostic Tests:  MRCP 11/21: Acute pancreatitis with extensive pancreatic and periPancreatic inflammatory changes, including proteinaceous and/or hemorrhagic peri-pancreatic fluid.  No choledocholithiasis or biliary tract obstruction.  Small volume of ascites, trace bilateral pleural effusions.  Bilateral basilar volume loss  CT abdomen/pelvis 10/9>> persistent peripancreatic edema and fluid, slightly more organized, small area of apparent pancreatic necrosis, no intra-or extrahepatic biliary ductal dilatation, small bilateral pleural effusions with lower lobe atelectatic change  Micro Data:  Cultures negative  Antimicrobials:  Zosyn 11/20>>>11/26 Meropenem 11/26>>> 11/11/2018 Anidulafungin 12/8 >> 12/11 Meropenem 12/8 >> 12/12  Interim history/subjective:  Appears to be in significant pain Family at bedside want transition to comfort care.  Objective   Blood pressure (!) 93/58, pulse 88, temperature 98.2 F (36.8 C), temperature source Oral, resp. rate (!) 28, height 6' (1.829 m), weight 81.4 kg, SpO2 91 %. CVP:  [1 mmHg-3 mmHg] 2 mmHg  FiO2 (%):  [35 %] 35 %   Intake/Output Summary (Last 24 hours) at 12/24/18 1019 Last data filed at 12/24/18 0800 Gross per 24 hour  Intake 615.55 ml  Output 350 ml  Net 265.55 ml   Filed Weights   11/24/18 0400 11/25/18 0300 12-24-18 0422  Weight: 79.3 kg 79.9 kg 81.4 kg   Examination: Gen:      Chronically ill-appearing appears to be in extreme discomfort. HEENT:  EOMI, jaundiced Neck:     No masses; no thyromegaly Lungs:    Clear to auscultation bilaterally; normal respiratory effort CV:         Regular rate and rhythm; no murmurs Abd:  Decreased bowel  sounds Ext:    No edema; adequate peripheral perfusion Skin:      Warm and dry; no rash Neuro: Moves all extremities, nonfocal  Resolved Hospital Problem list     Assessment & Plan:   Acute gallstone pancreatitis development of pancreatic necrosis no obvious abscess or cystic formation. -No evidence of choledocholelithiasis on MRCP -CT abdomen reassuring 12/9 Plan Morphine for pain  Shock.  Suspect distributive, in part related to volume removal with CVVH. D/C CRRT  Ileus D/C TF Zofran Reglan  Ongoing leukocytosis in setting of pancreatic inflammation Plan D/C further blood draws at this poin  Acute hypoxic respiratory failure requiring mechanical ventilation in the setting of acute aspiration event 11/29, progressive atelectasis, and decreased abdominal compliance from pain and impaired cough status post tracheostomy currently requiring ventilatory support. Plan No vent TC  Severe deconditioning and protein calorie malnutrition Plan D/C TF  Acute kidney injury with baseline creatinine 1.  This was hemodynamically mediated plus/minus contrast-induced nephropathy Plan D/C CRRT D/C blood draws  Hyperglycemia, then also periods of hypoglycemia Plan D/C CBGs D/C ISS  Elevated cumulative fluid balance and electrolyte imbalance: Hyperkalemia Plan D/C further blood draws  Spoke with family at length.  Will transition to comfort care.  Titrate morphine up to to eusure comfort  The patient is critically ill with multiple organ system failure and requires high complexity decision making for assessment and support, frequent evaluation and titration of therapies, advanced monitoring, review of radiographic studies and interpretation of complex data.   Critical Care Time devoted to patient care services, exclusive of separately billable procedures, described in this note is 35 minutes.   Marshell Garfinkel MD [facility] Pager [phone] 12-13-18, 10:25 AM

## 2018-12-09 NOTE — Progress Notes (Signed)
It appears that plan is for comfort care at this point.  I will sign off, call if I can be of any further assist  Louis Meckel

## 2018-12-09 NOTE — Progress Notes (Signed)
Pt expired peacefully at 1628. RNs pronounced. Morphine gtt turned off upon pronunciation. Jacqulyn Bath, pt's brother, called and informed of pt's passing. CDS called at 1642.

## 2018-12-09 DEATH — deceased

## 2019-02-06 IMAGING — CT CT ABDOMEN W/ CM
2 of 5 series · 15 of 46 positions shown, 17 images · IV contrast (APPLIED)
Comparison: 10/27/2018

CLINICAL DATA: Pancreatitis and worsening. Evaluate for pancreatic
necrosis.

EXAM:
CT ABDOMEN WITH CONTRAST
TECHNIQUE: Multidetector CT imaging of the abdomen was performed using the
standard protocol following bolus administration of intravenous
contrast.
CONTRAST:  80mL OMNIPAQUE IOHEXOL 300 MG/ML  SOLN

[Series 3: abd/ pelvis 5.0 i30f 2 · axial · 0.78mm/px · z∈[+1194,+1459]mm · 12 of 63 slices shown, 14 images]
[im 5/63  soft-tissue]
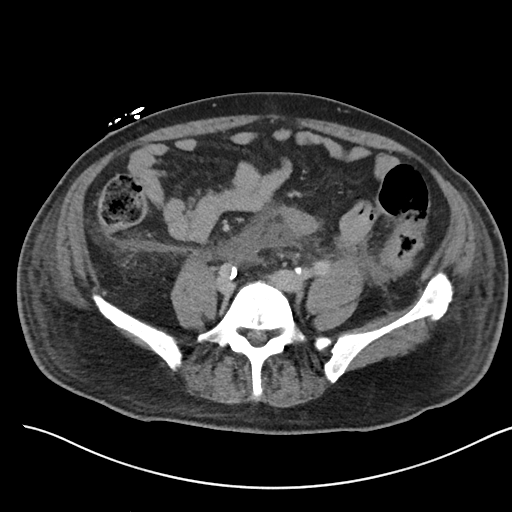
[im 5/63  bone]
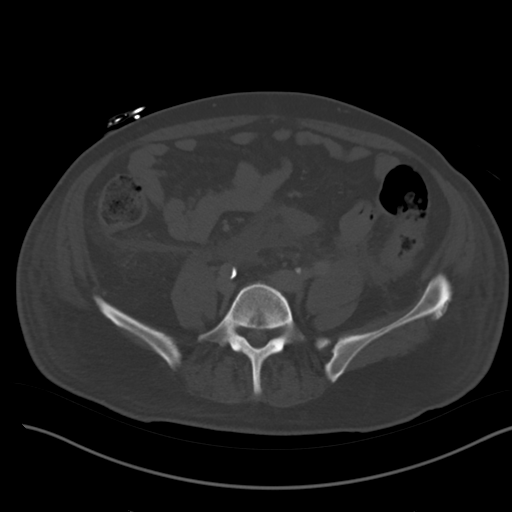
[im 9/63  soft-tissue]
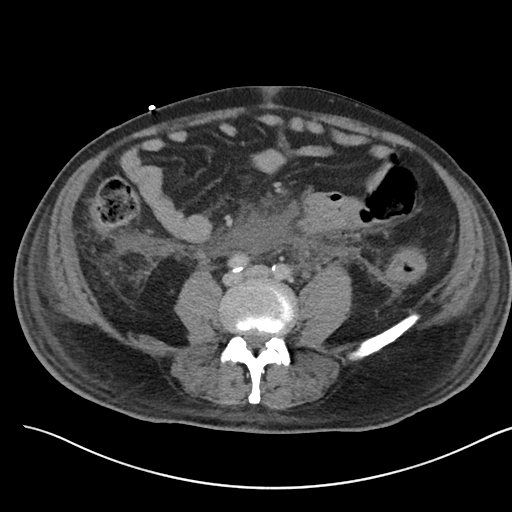
[im 13/63  soft-tissue]
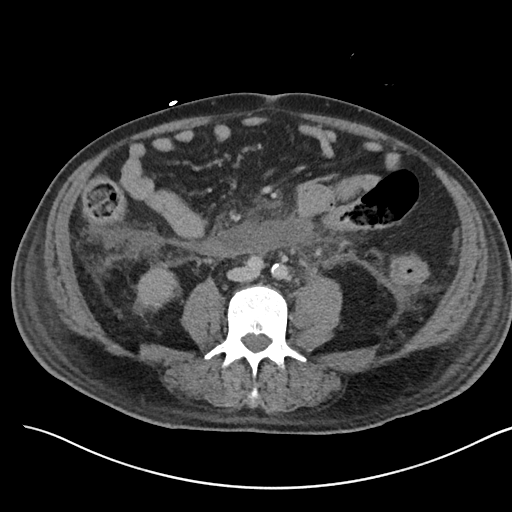
[im 21/63  soft-tissue]
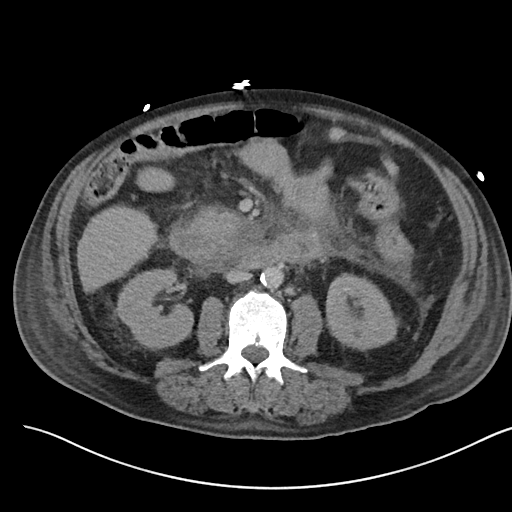
[im 25/63  soft-tissue]
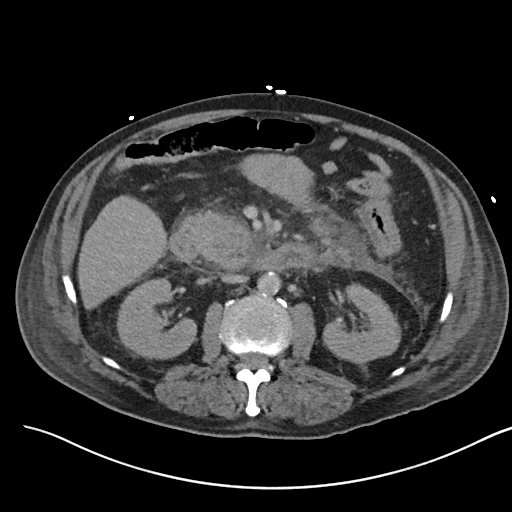
[im 29/63  soft-tissue]
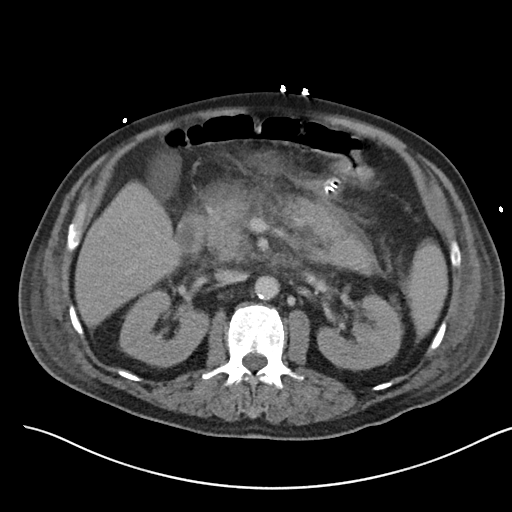
[im 34/63  soft-tissue]
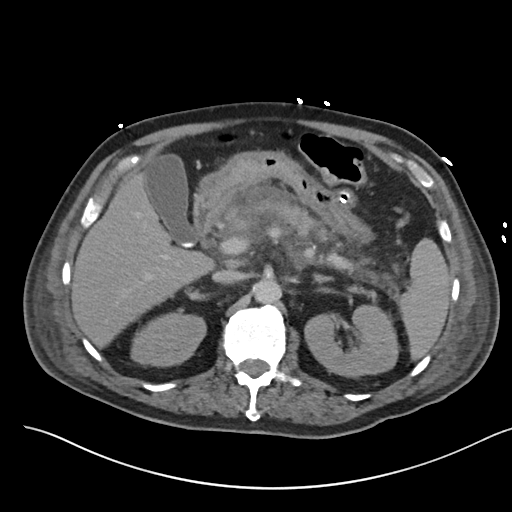
[im 38/63  soft-tissue]
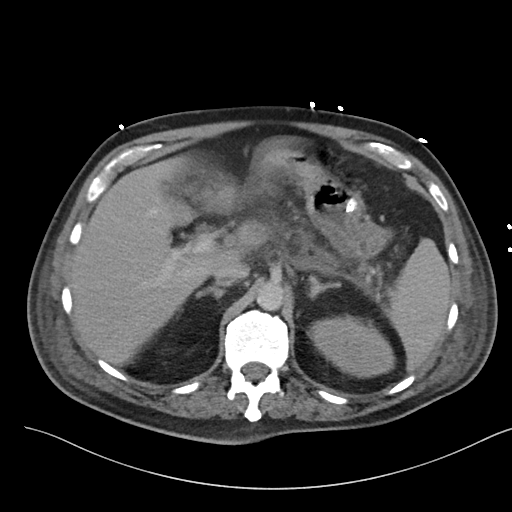
[im 42/63  soft-tissue]
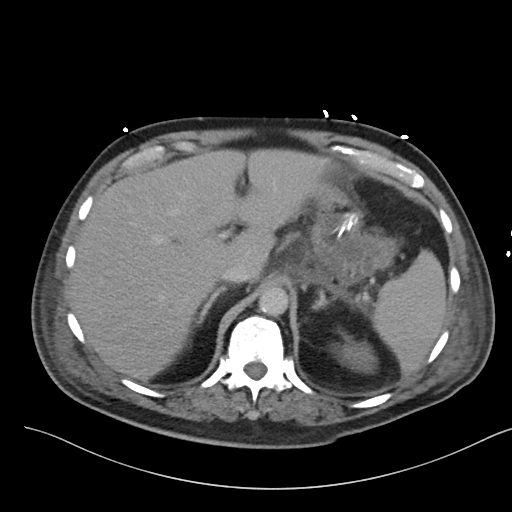
[im 42/63  bone]
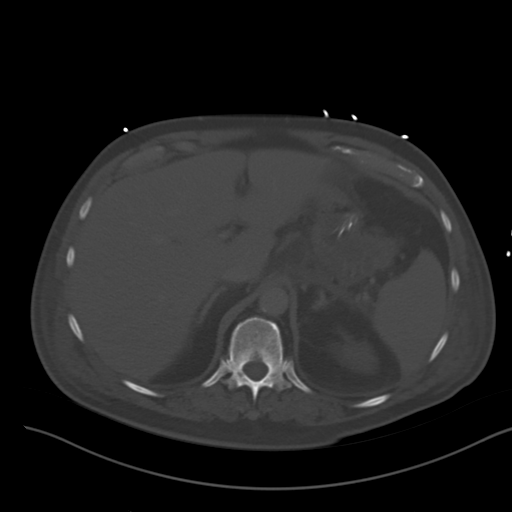
[im 50/63  soft-tissue]
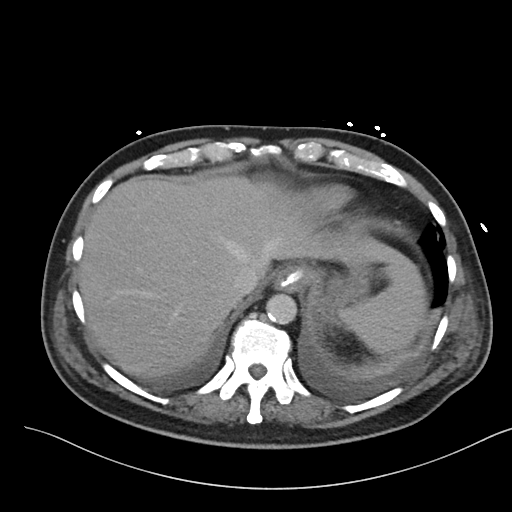
[im 54/63  soft-tissue]
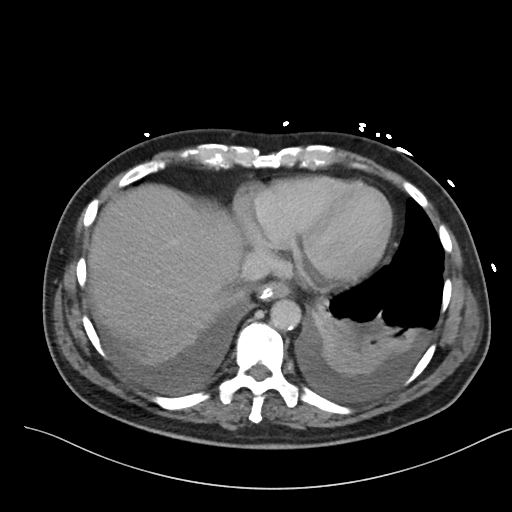
[im 58/63  soft-tissue]
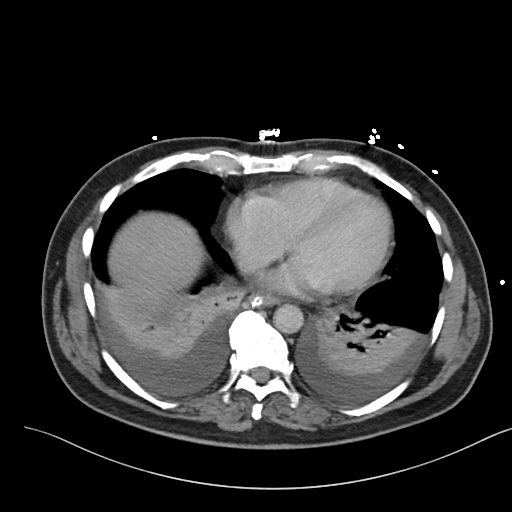

[Series 6: coronal soft tissue · coronal · 0.61mm/px · 3 of 103 slices shown]
[im 35/103  soft-tissue]
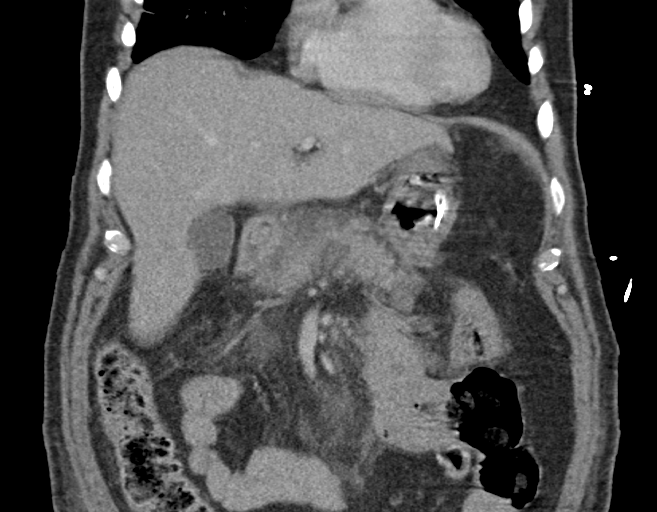
[im 46/103  soft-tissue]
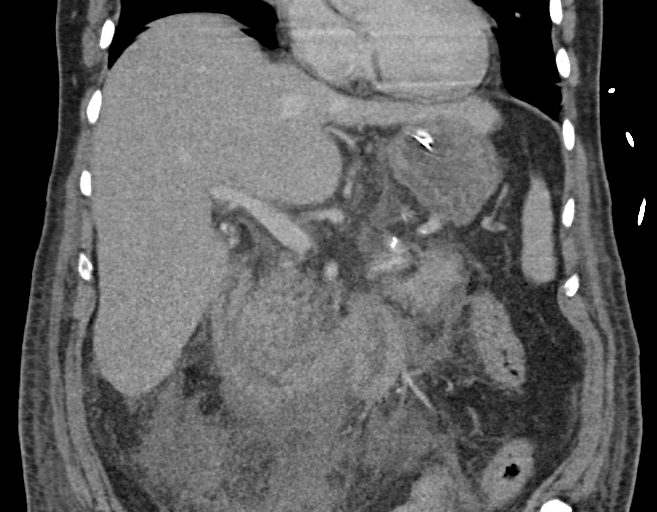
[im 57/103  soft-tissue]
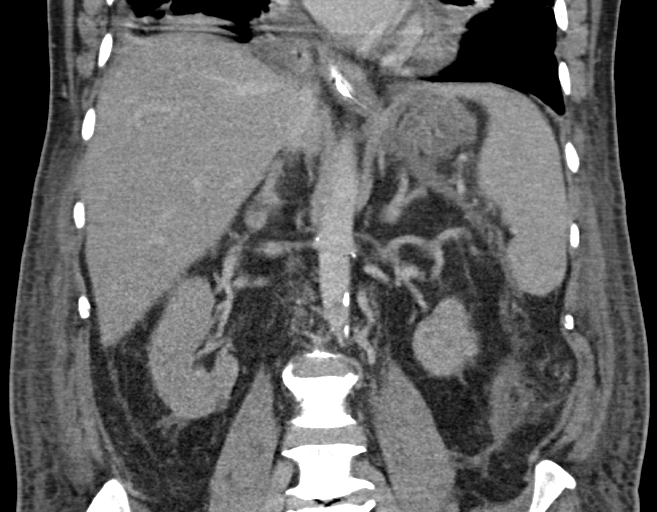

[15 of 46 positions shown; findings below may reference images not displayed]

FINDINGS: Lower chest: Small bilateral pleural effusions are associated with
bibasilar collapse/consolidation, progressed since prior.

Hepatobiliary: No focal abnormality within the liver parenchyma.
Tiny calcified stones are seen in the gallbladder. No intrahepatic
or extrahepatic biliary dilation.

Pancreas: There is diffuse peripancreatic edema/inflammation. A
short segment of pancreatic body does not enhance suggesting short
segment of pancreatic necrosis. No dilatation of the main pancreatic
duct. No organized intrapancreatic or peripancreatic fluid
collection.

Spleen: No splenomegaly. No focal mass lesion.

Adrenals/Urinary Tract: No adrenal nodule or mass. Lack of contrast
excretion on renal delay imaging is compatible with the patient's
history of renal dysfunction..

Stomach/Bowel: NG tube tip is in the mid stomach. Stomach is
decompressed.. Duodenal edema/inflammation likely secondary to the
pancreatitis. No small bowel or colonic dilatation within the
visualized abdomen.

Vascular/Lymphatic: There is abdominal aortic atherosclerosis
without aneurysm. Portal vein and superior mesenteric vein are
patent. The splenic vein is patent. No evidence for celiac axis or
SMA complication. There is no gastrohepatic or hepatoduodenal
ligament lymphadenopathy. No intraperitoneal or retroperitoneal
lymphadenopathy..

Other: Trace free fluid is seen in each paracolic gutter with
retroperitoneal fluid evident caudal to the pancreas.

Musculoskeletal: No worrisome lytic or sclerotic osseous
abnormality.
IMPRESSION: 1. Peripancreatic edema/inflammation compatible with pancreatitis.
There is a short segment (about 14 mm in length) of nonenhancing
parenchyma in the body of pancreas suggesting a small focus of
pancreatic necrosis.
2. Retroperitoneal edema/fluid with trace fluid in each paracolic
gutter.
3. No organized or rim enhancing intra pancreatic or peripancreatic
fluid collection.

## 2019-02-09 IMAGING — DX DG CHEST 1V
1 series · 1 of 1 positions shown · non-contrast
Comparison: 11/06/2018

CLINICAL DATA: Advancement of endotracheal tube.

EXAM:
CHEST  1 VIEW

[chest ap]
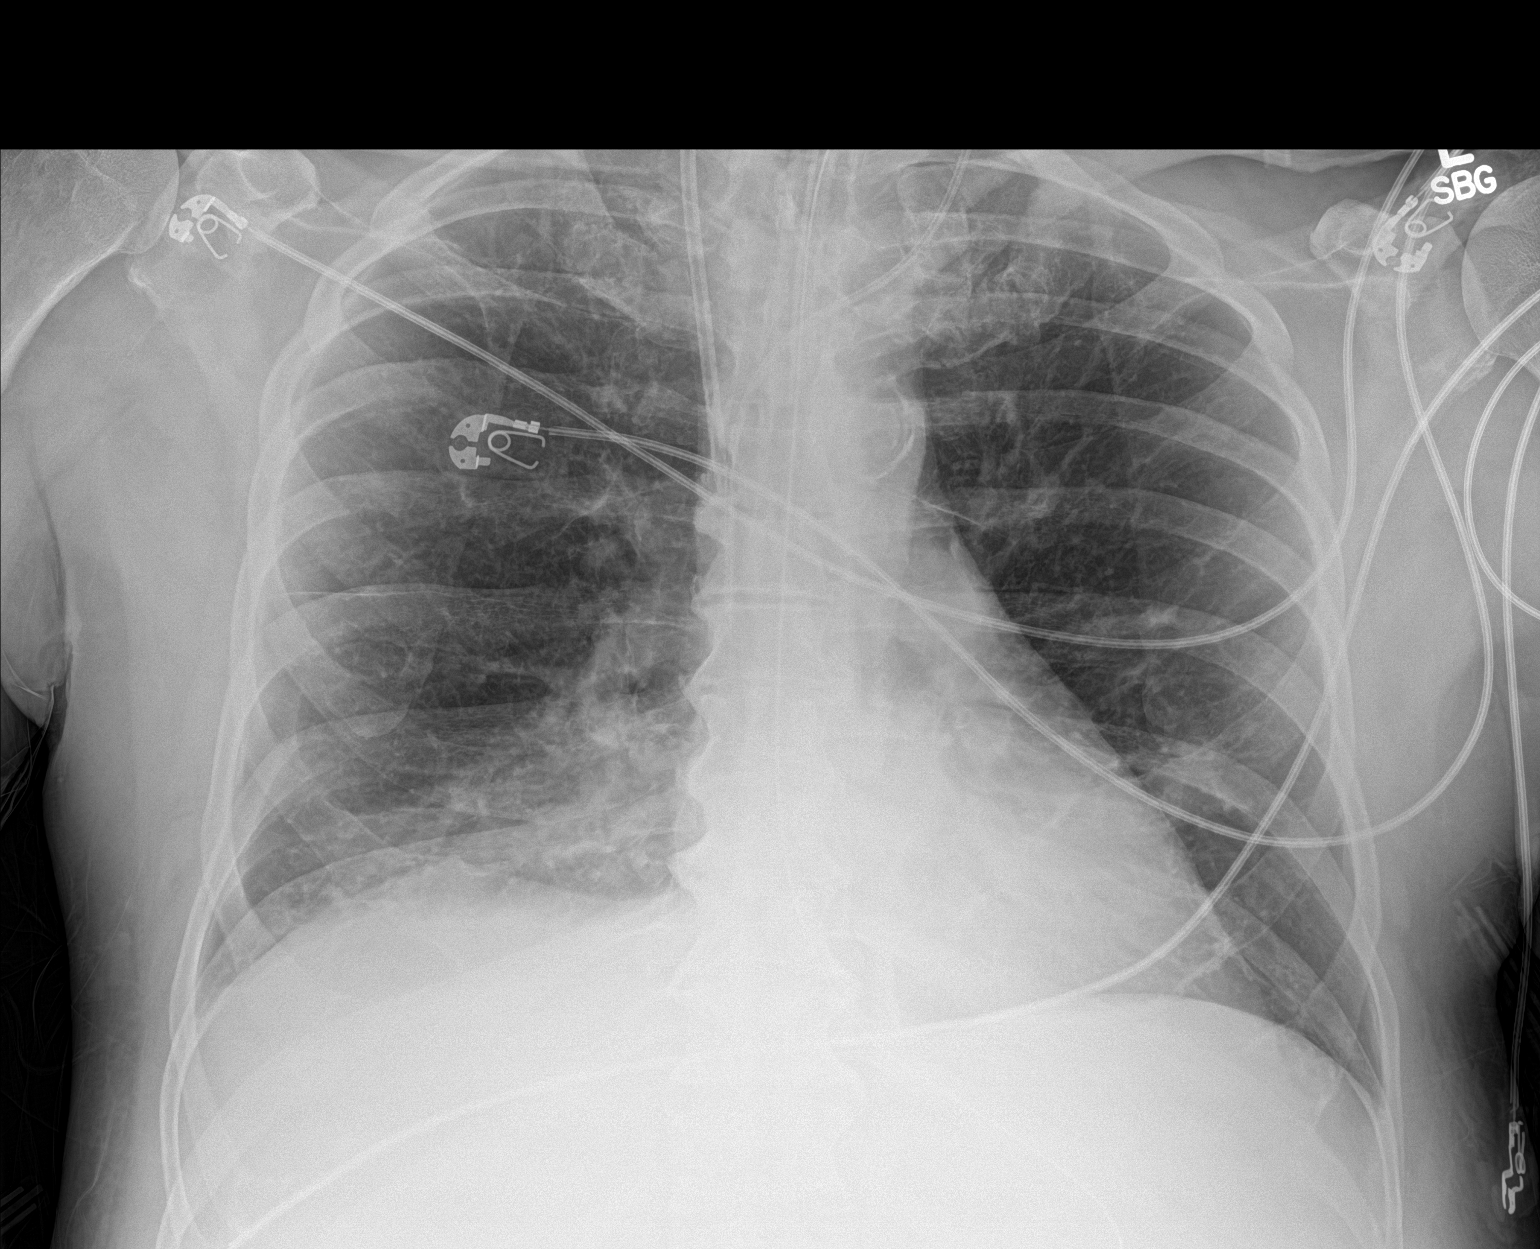

[1 of 1 positions shown; findings below may reference images not displayed]

FINDINGS: Endotracheal tube in satisfactory position. The remaining support
apparatus is stable.

Cardiomediastinal silhouette is normal. Mediastinal contours appear
intact. Calcific atherosclerotic disease of the aorta.

There is no evidence of pneumothorax. Right lower lobe airspace
consolidation versus atelectasis. Mild atelectasis of the left lower
lobe. Probably bilateral small subpulmonic effusion.

Osseous structures are without acute abnormality. Soft tissues are
grossly normal.
IMPRESSION: Satisfactory position of the endotracheal tube.

Right lower lobe airspace consolidation versus atelectasis.

Mild atelectasis of the left lower lobe.

Bilateral small subpulmonic effusions.

## 2019-02-10 IMAGING — DX DG CHEST 1V PORT
2 series · 2 of 2 positions shown · non-contrast
Comparison: Chest radiograph from one day prior.

CLINICAL DATA: Tracheostomy

EXAM:
PORTABLE CHEST 1 VIEW

[chest ap (1 of 2)]
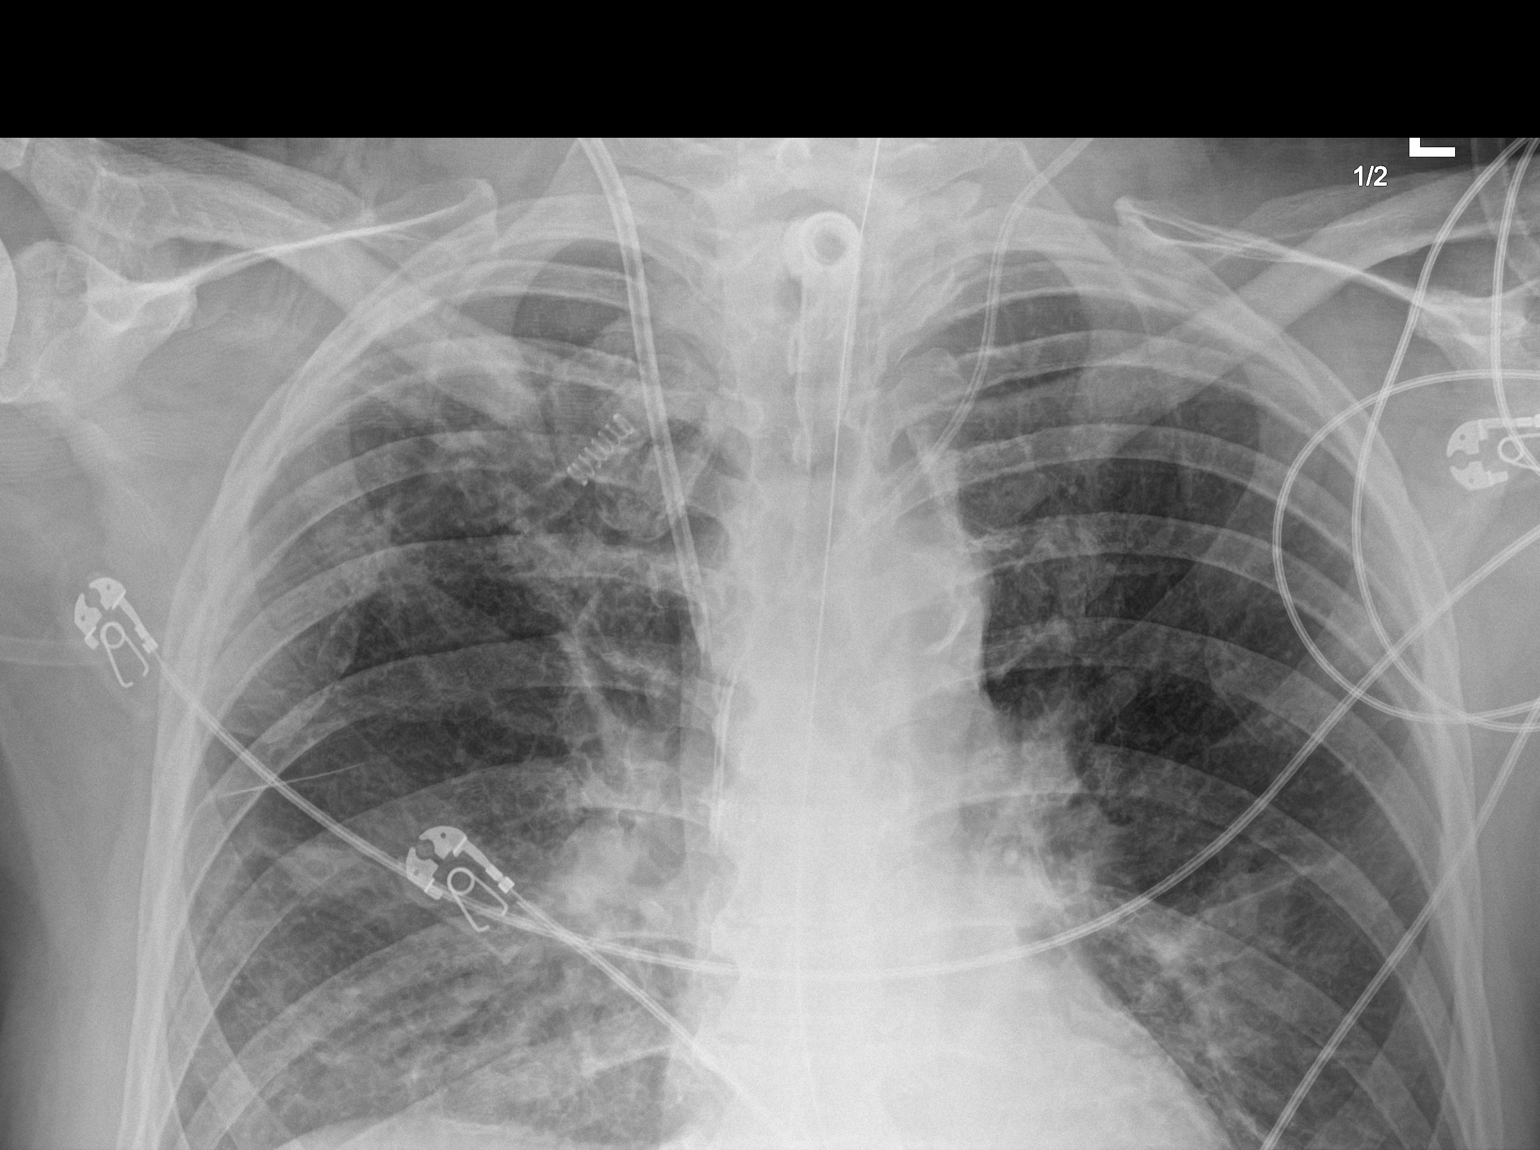

[chest ap (2 of 2)]
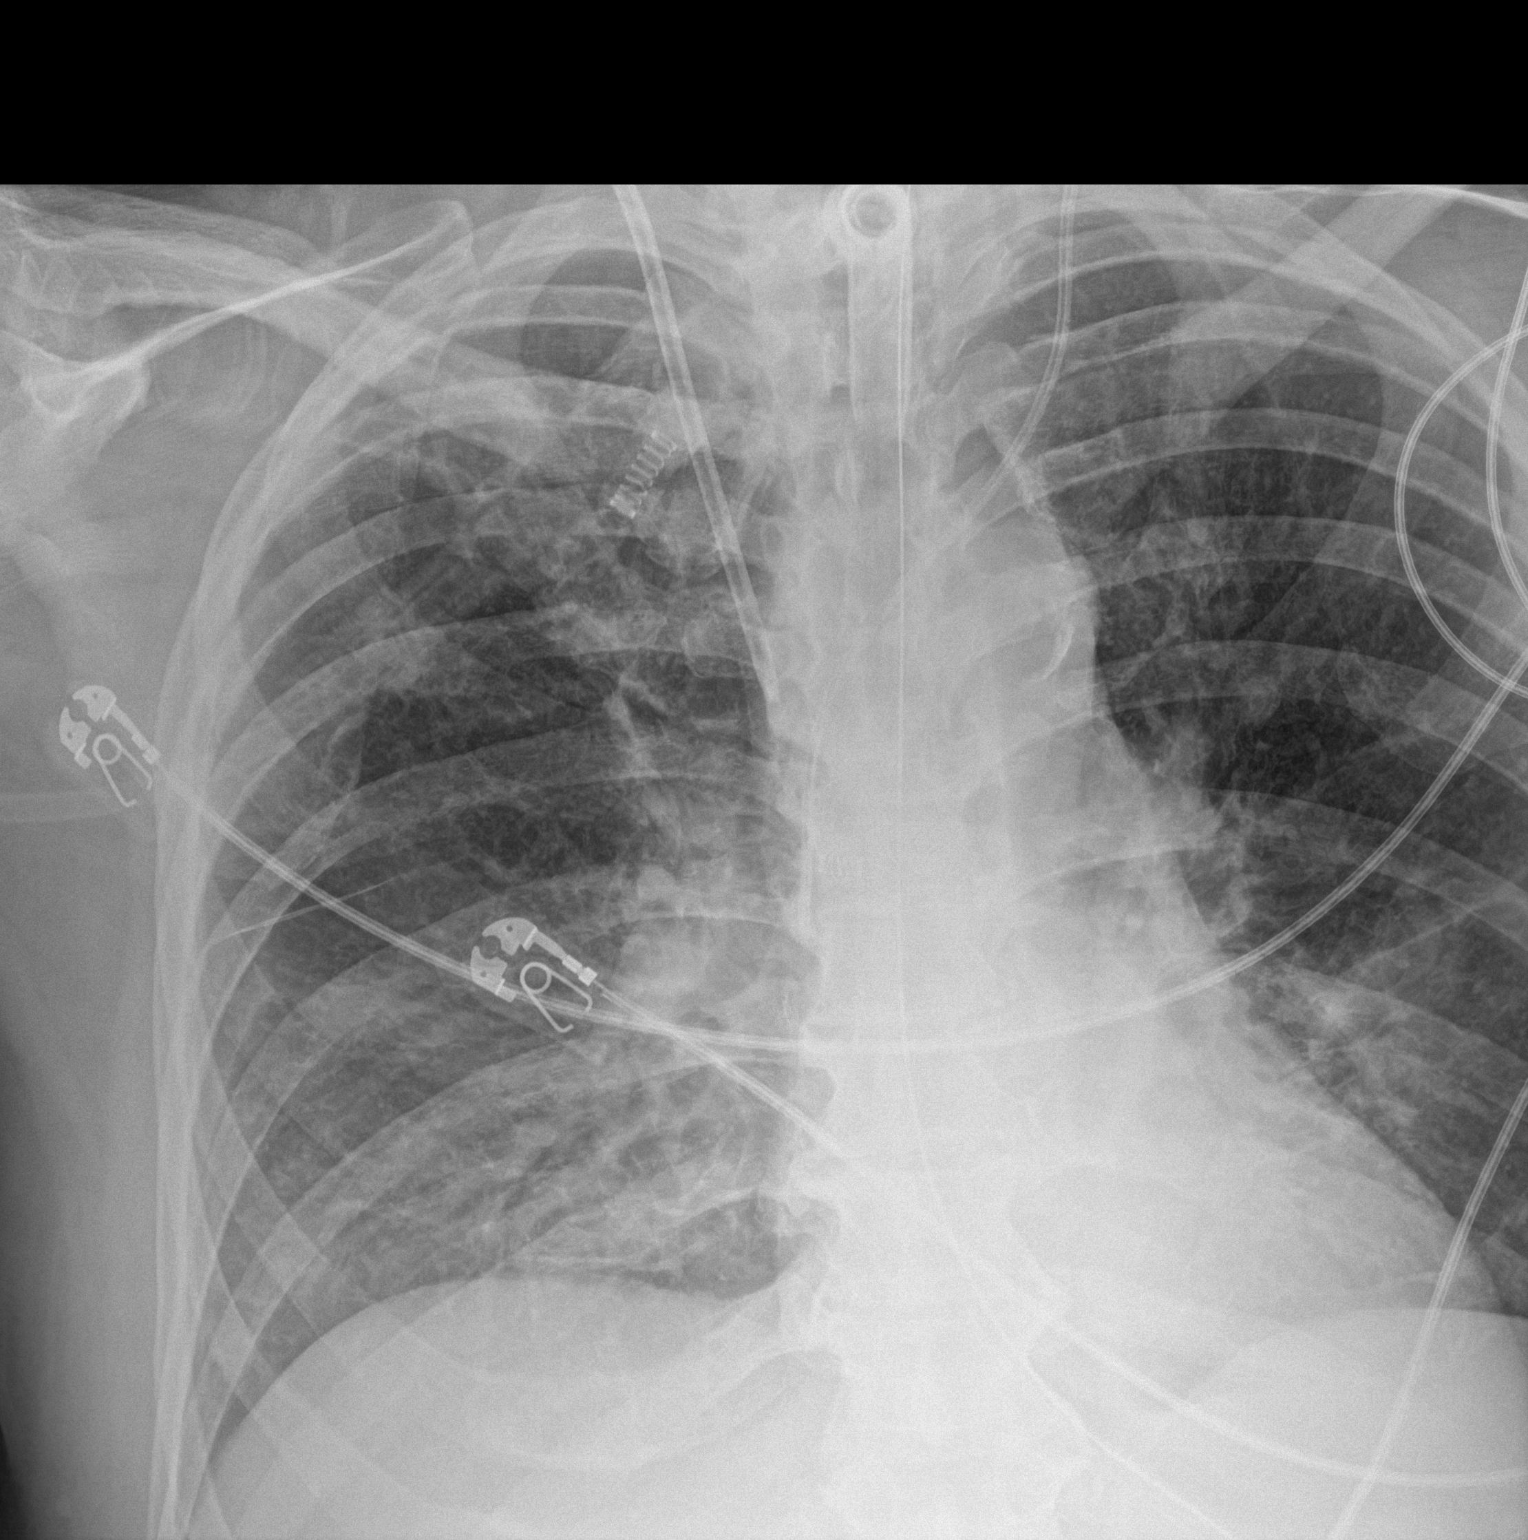

[2 of 2 positions shown; findings below may reference images not displayed]

FINDINGS: Tracheostomy tube tip overlies the tracheal air column at the
thoracic inlet. Enteric tube enters stomach with the tip not seen on
this image. Right internal jugular central venous catheter
terminates in middle third of the SVC. Left internal jugular central
venous catheter terminates in the lower third of the SVC. Stable
cardiomediastinal silhouette with normal heart size. No
pneumothorax. No pleural effusion. No overt pulmonary edema. Hazy
bibasilar lung opacities are stable.
IMPRESSION: 1. Well-positioned tracheostomy tube and support structures. No
pneumothorax.
2. Hazy bibasilar lung opacities, stable, which could represent
atelectasis or pneumonia.

## 2019-02-10 IMAGING — DX DG ABDOMEN 1V
1 series · 1 of 1 positions shown · non-contrast
Comparison: 11/08/2018.

CLINICAL DATA: Ileus/constipation.

EXAM:
ABDOMEN - 1 VIEW

[abdomen kub]
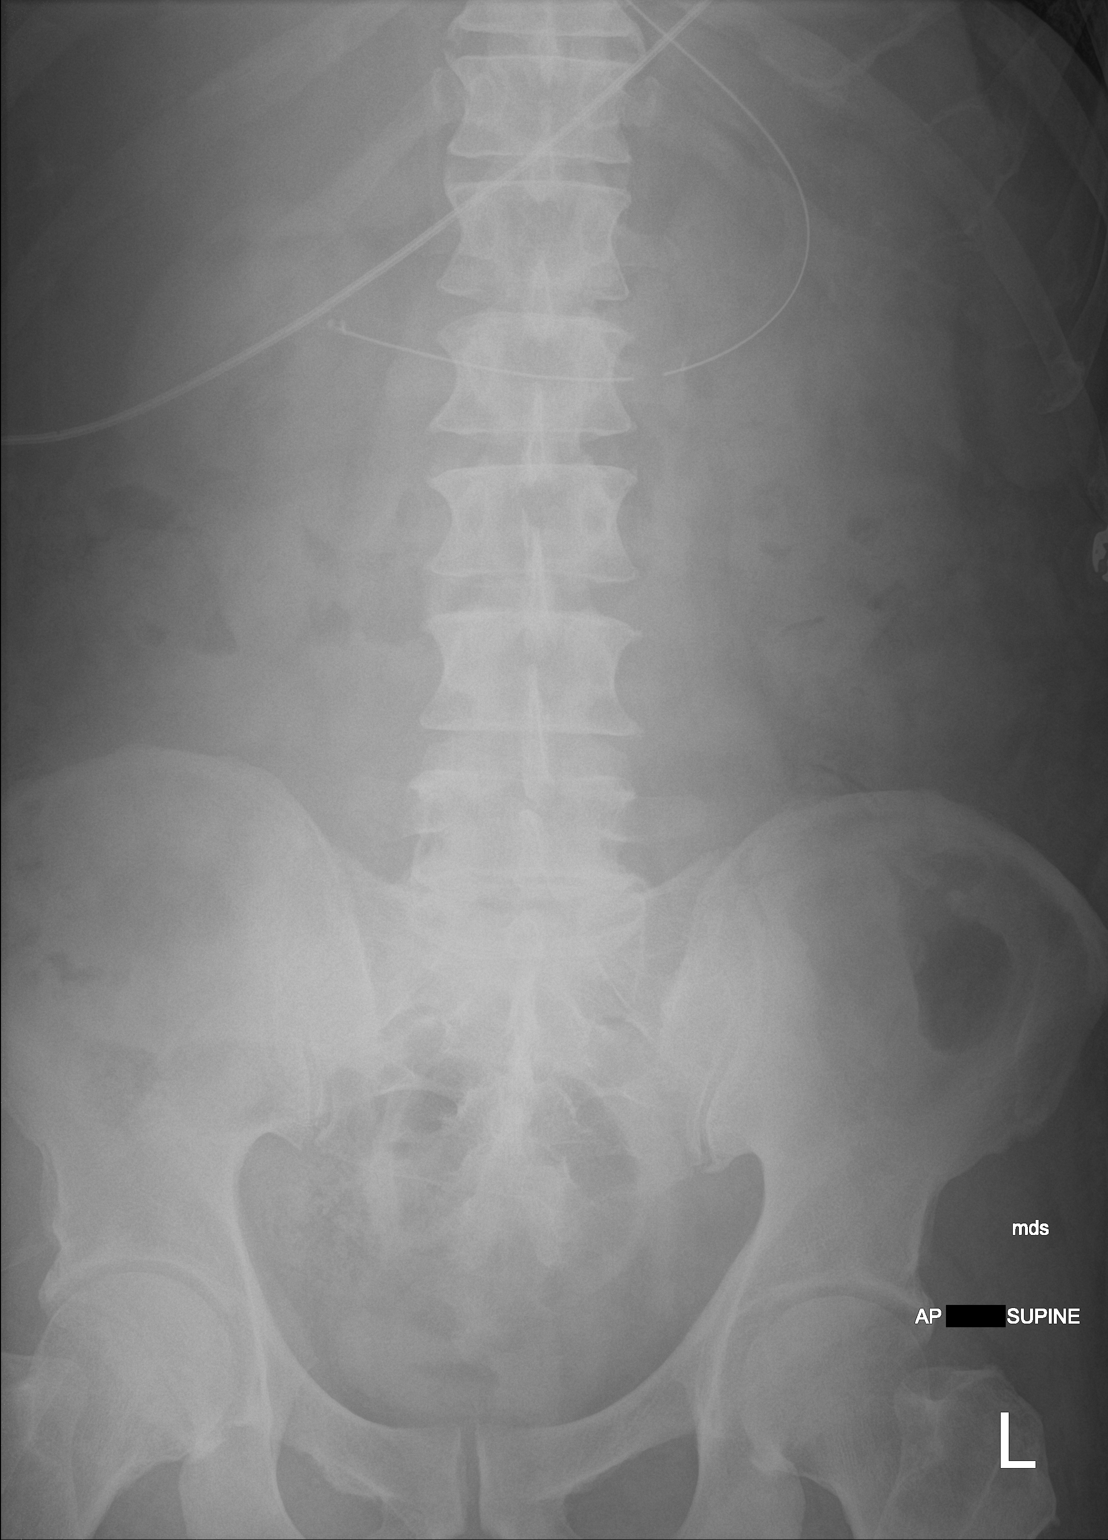

[1 of 1 positions shown; findings below may reference images not displayed]

FINDINGS: Enteric tube is present with tip unchanged over the right upper
quadrant likely over the distal stomach. Bowel gas pattern is
nonobstructive with mild fecal retention throughout the colon.
Remainder of the exam is unchanged.
IMPRESSION: Nonobstructive bowel gas pattern with mild fecal retention
throughout the colon.

Nasogastric tube unchanged.

## 2019-12-10 DEATH — deceased
